# Patient Record
Sex: Male | Born: 1965
Health system: Southern US, Community
[De-identification: ages and names within clinical notes are randomized; demographics above are authoritative.]

## PROBLEM LIST (undated history)

## (undated) DIAGNOSIS — B20 Human immunodeficiency virus [HIV] disease: Secondary | ICD-10-CM

## (undated) DIAGNOSIS — R1031 Right lower quadrant pain: Secondary | ICD-10-CM

## (undated) HISTORY — PX: TONSILLECTOMY: SUR1361

## (undated) HISTORY — DX: Human immunodeficiency virus (HIV) disease: B20

## (undated) HISTORY — DX: Right lower quadrant pain: R10.31

---

## 1996-03-28 DIAGNOSIS — B20 Human immunodeficiency virus [HIV] disease: Secondary | ICD-10-CM

## 1996-03-28 DIAGNOSIS — Z21 Asymptomatic human immunodeficiency virus [HIV] infection status: Secondary | ICD-10-CM

## 1996-03-28 HISTORY — DX: Asymptomatic human immunodeficiency virus (hiv) infection status: Z21

## 1996-03-28 HISTORY — DX: Human immunodeficiency virus (HIV) disease: B20

## 2002-05-08 ENCOUNTER — Encounter: Payer: Self-pay | Admitting: Emergency Medicine

## 2002-05-08 ENCOUNTER — Emergency Department (HOSPITAL_COMMUNITY): Admission: EM | Admit: 2002-05-08 | Discharge: 2002-05-08 | Payer: Self-pay | Admitting: Emergency Medicine

## 2005-12-27 ENCOUNTER — Encounter (INDEPENDENT_AMBULATORY_CARE_PROVIDER_SITE_OTHER): Payer: Self-pay | Admitting: *Deleted

## 2005-12-27 ENCOUNTER — Inpatient Hospital Stay (HOSPITAL_COMMUNITY): Admission: EM | Admit: 2005-12-27 | Discharge: 2006-01-03 | Payer: Self-pay | Admitting: Emergency Medicine

## 2005-12-27 ENCOUNTER — Ambulatory Visit: Payer: Self-pay | Admitting: Internal Medicine

## 2005-12-27 ENCOUNTER — Ambulatory Visit: Payer: Self-pay | Admitting: Family Medicine

## 2005-12-27 LAB — CONVERTED CEMR LAB
CD4 Count: 110 microliters
CD4 T Cell Abs: 110

## 2006-02-01 ENCOUNTER — Ambulatory Visit: Payer: Self-pay | Admitting: Internal Medicine

## 2006-02-10 DIAGNOSIS — B20 Human immunodeficiency virus [HIV] disease: Secondary | ICD-10-CM | POA: Insufficient documentation

## 2006-02-10 DIAGNOSIS — K5000 Crohn's disease of small intestine without complications: Secondary | ICD-10-CM | POA: Insufficient documentation

## 2006-02-10 DIAGNOSIS — K560 Paralytic ileus: Secondary | ICD-10-CM | POA: Insufficient documentation

## 2006-05-12 ENCOUNTER — Encounter: Payer: Self-pay | Admitting: Internal Medicine

## 2006-05-22 ENCOUNTER — Encounter (INDEPENDENT_AMBULATORY_CARE_PROVIDER_SITE_OTHER): Payer: Self-pay | Admitting: *Deleted

## 2006-05-22 LAB — CONVERTED CEMR LAB
CD4 Count: 0 microliters
HIV 1 RNA Quant: 0 copies/mL

## 2006-06-04 ENCOUNTER — Encounter (INDEPENDENT_AMBULATORY_CARE_PROVIDER_SITE_OTHER): Payer: Self-pay | Admitting: *Deleted

## 2007-03-29 HISTORY — PX: HERNIA REPAIR: SHX51

## 2008-03-11 ENCOUNTER — Ambulatory Visit (HOSPITAL_COMMUNITY): Admission: RE | Admit: 2008-03-11 | Discharge: 2008-03-11 | Payer: Self-pay | Admitting: General Surgery

## 2008-12-27 IMAGING — CR DG CHEST 2V
2 series · 2 of 2 positions shown · non-contrast
Comparison: None

CLINICAL DATA: A supraumbilical hernia

CHEST - 2 VIEW

[view not recorded (1 of 2)]
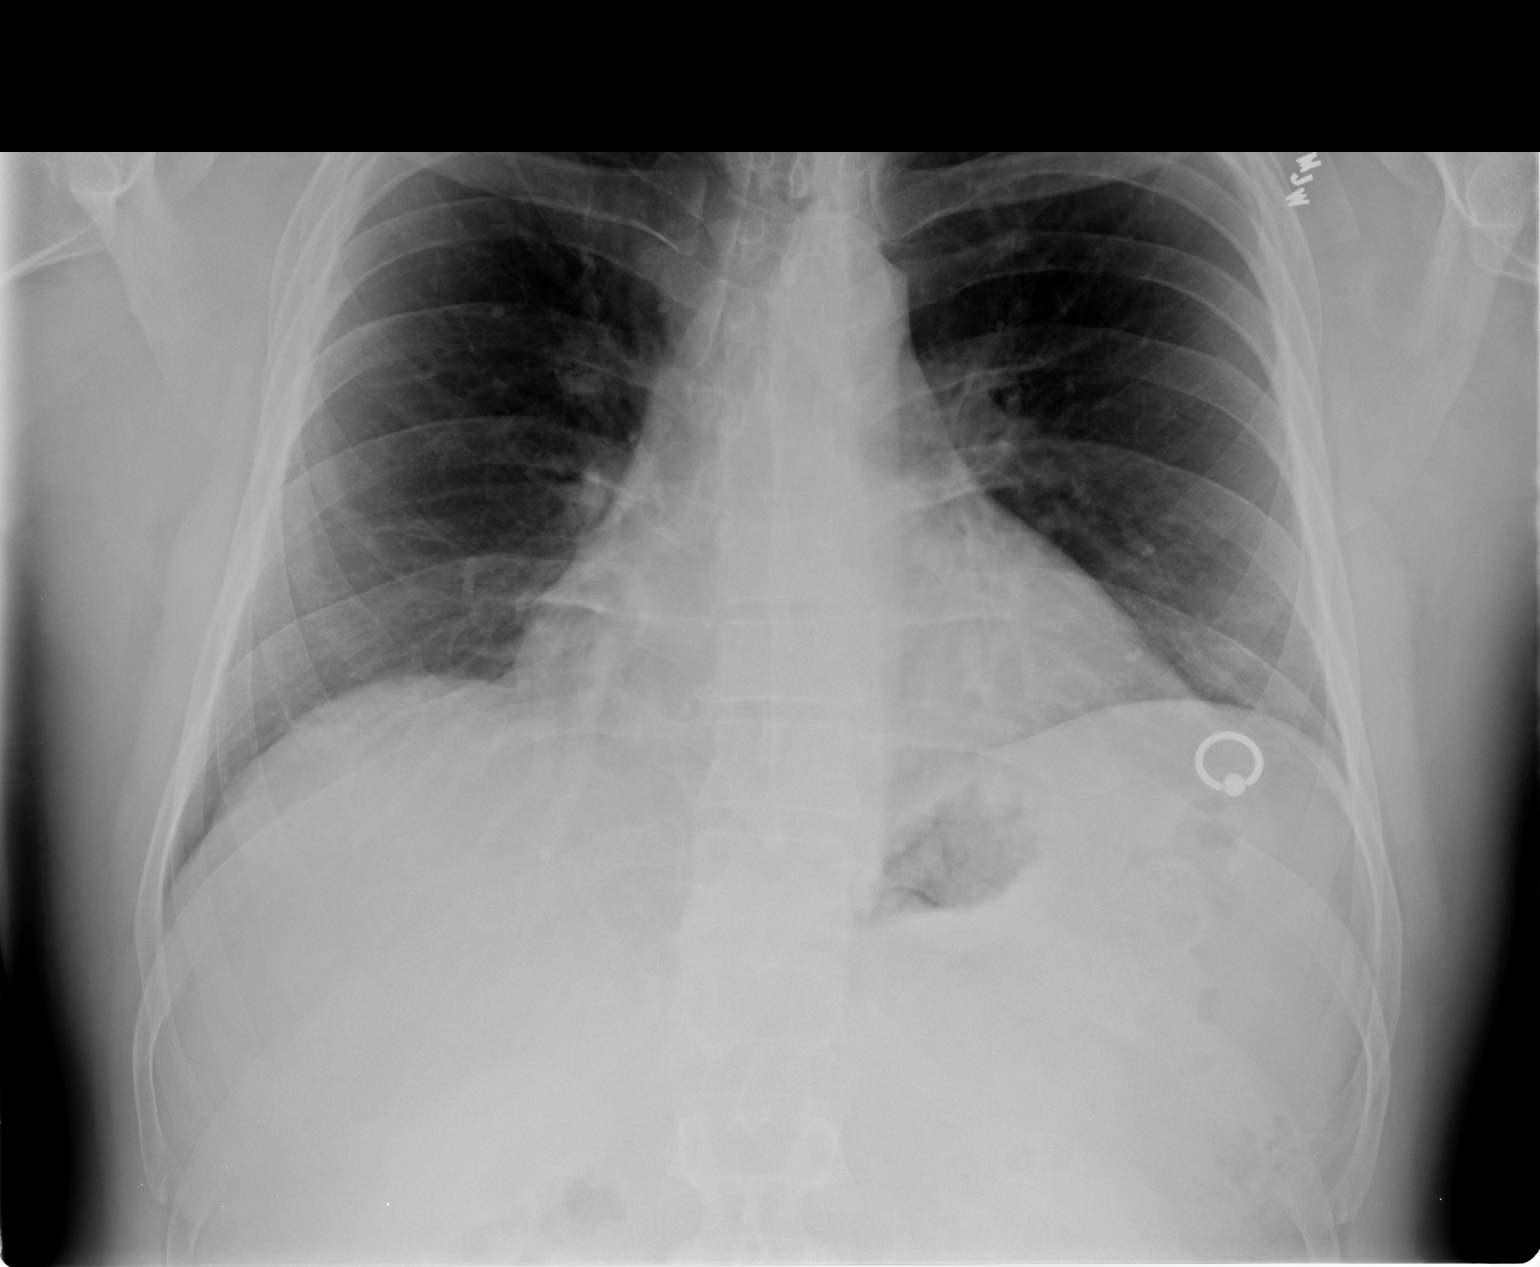

[view not recorded (2 of 2)]
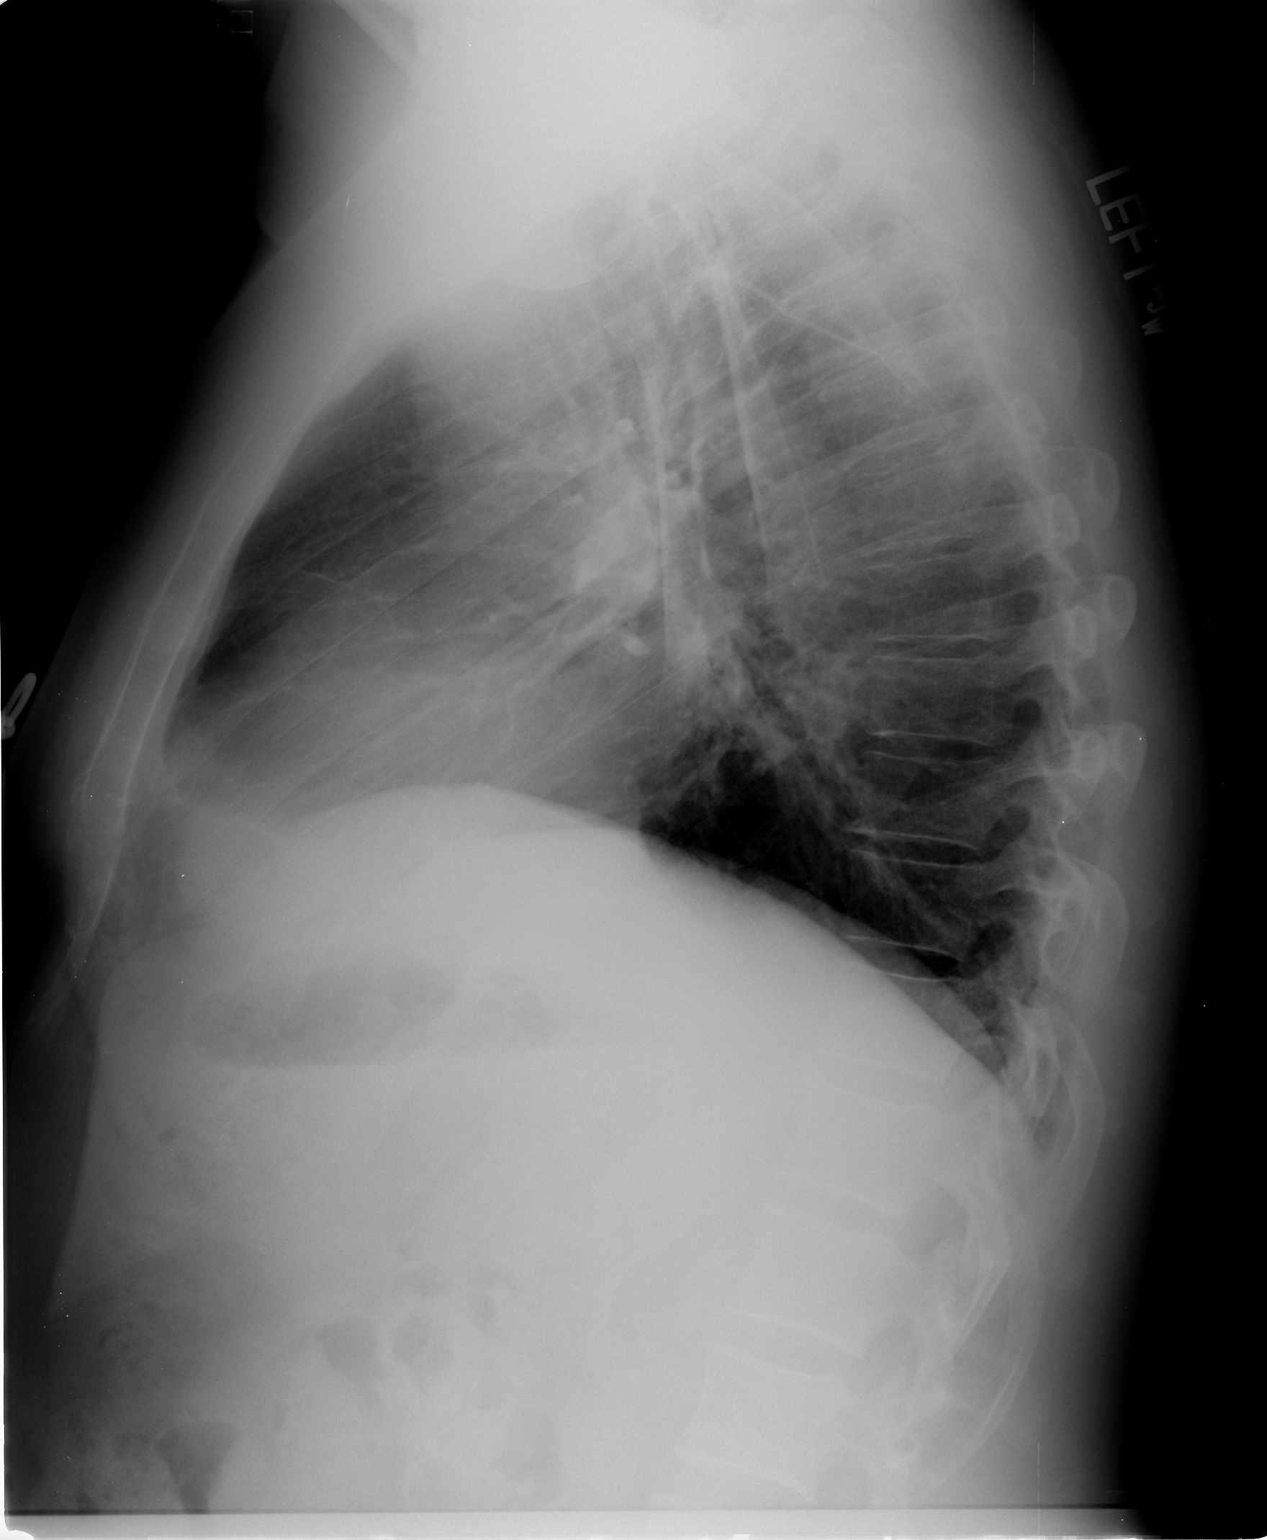

[2 of 2 positions shown; findings below may reference images not displayed]

FINDINGS: There is mild cardiac enlargement.

No pleural effusion or pulmonary interstitial edema noted.

The lung volumes are low.

No airspace densities are identified.
IMPRESSION: 1.  No active disease.
2.  Mild cardiac enlargement.

## 2010-02-25 HISTORY — PX: HERNIA REPAIR: SHX51

## 2010-08-10 NOTE — Op Note (Signed)
NAME:  Eric Chapman, Eric Chapman NO.:  [id number]   MEDICAL RECORD NO.:  [id number]          PATIENT TYPE:  AMB   LOCATION:                               FACILITY:  [facility]   PHYSICIAN:  Cherylynn Ridges, M.D.    DATE OF BIRTH:  05-10-1965   DATE OF PROCEDURE:  DATE OF DISCHARGE:                               OPERATIVE REPORT   Patient of Dr. [name]. [name], MD and Dr. Worthy Flank.   PREOPERATIVE DIAGNOSIS:  Supraumbilical ventral hernia.   POSTOPERATIVE DIAGNOSIS:  A 3-cm supraumbilical ventral hernia.   PROCEDURE:  Primary repair of ventral hernia without mesh.   SURGEON:  Cherylynn Ridges, MD   ANESTHESIA:  General with laryngeal airway.   ESTIMATED BLOOD LOSS:  Less than 20 mL.   COMPLICATIONS:  No complication.   CONDITION:  Stable.   SPECIMENS:  No specimens were sent.   FINDINGS:  A 3-cm supraumbilical defect, about 2 cm above the umbilicus.   INDICATIONS FOR OPERATION:  The patient is a 45 year old with  symptomatic supraumbilical ventral hernia who comes in for repair.   OPERATION:  The patient was taken to the operating room and placed on  table in the supine position.  After an adequate general laryngeal  airway anesthetic was administered, he was prepped and draped in the  usual sterile manner exposing the supraumbilical area.   A 5-cm midline supraumbilical incision was made using a #10 blade and  taken down to just above the umbilicus.  It was taken down to  subcutaneous tissue where the preperitoneal fat extruding through the  hernia defect was noted.   We dissected the flaps only about a centimeter from the fascial edge  around the hernia defect.  I could see that it was clearly separated  from the umbilicus by about 2 cm.  We twisted the preperitoneal extruded  contents back into the peritoneal space or preperitoneal space and then  we closed the defect using interrupted simple stitches of #1 Novofil.  We reinforced this using running  back-and-forth stitch of 0 Novofil.  No  mesh was used.  We irrigated with antibiotic solution and closed in 3  layers deep subcu layer of 3-0 Vicryl, no more superficial subcu layer  of 3-0  Vicryl, then the skin was closed using running subcuticular stitch of 4-  0 Monocryl.  Marcaine 0.5% without epi was injected into the wound prior  to closure.  Dermabond, Steri-Strips, and Tegaderm were used to complete  the dressing.  All counts were correct.      Cherylynn Ridges, M.D.  Electronically Signed     JOW/MEDQ  D:  03/11/2008  T:  03/11/2008  Job:  [id number]   cc:   Deniece Portela A. Sheffield Slider, M.D.
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
## 2010-08-13 NOTE — Consult Note (Signed)
NAME:  Eric Chapman, Eric Chapman NO.:  [id number]   MEDICAL RECORD NO.:  [id number]          PATIENT TYPE:  INP   LOCATION:  5019                         FACILITY:  [facility]   PHYSICIAN:  [name]. [name], MD    DATE OF BIRTH:  [birth year]   DATE OF CONSULTATION:  DATE OF DISCHARGE:                                   CONSULTATION   REASON FOR CONSULTATION:  Ileus versus small-bowel obstruction.   HISTORY OF PRESENT ILLNESS:  This is a 45 year old gentleman with a past  medical history of HIV who was admitted to the hospital approximately 1 week  ago with complaints of severe diarrhea.  The patient states that his  diarrhea started rather acutely on Saturday night and then progressed to the  point that he felt that he needed further treatment in a hospital setting  and subsequently admitted and underwent a CT scan of his abdomen as his KUB  revealed dilated loops of bowel. The CT scan was consistent with a dilated  loops of jejunum, however, there is no obvious evidence of a transition  point. Over the course of this hospitalization he was diagnosed with a  gastroenteritis and started on Flagyl and Cipro which he responded. His  stool studies were negative for any evidence of C diff and he was  subsequently discontinued on Flagyl. Over the course of his hospitalization,  he has continued to improve from the diarrheal standpoint however, his  abdominal distension did worsen. Initially did notice having abdominal  distension prior to his admission however, and it appeared to be stable, but  then acutely worsened several days into his admission and subsequently  required the insertion of NG tube. The patient denies any problems with  abdominal distension in the past and he also denies having any prior  surgical procedures. He did have one episode of intense abdominal pain bit  it appeared to have resolved spontaneously and currently he is having  frequent soft bowel movements at this  time which is a marked improvement  from his admission status.   PAST MEDICAL HISTORY/PAST SURGICAL HISTORY:  As stated above.   MEDICATIONS PRIOR TO ADMISSION:  Atripla 1 p.o. daily, which he has a  history of noncompliance with his medication.   ALLERGIES:  No known drug allergies.   FAMILY HISTORY:  Noncontributory for his current admission.   SOCIAL HISTORY:  The patient is self-employed. No tobacco, alcohol or  illicit drug use.   REVIEW OF SYSTEMS:  Positive for diarrhea, some nausea and vomiting during  the hospitalization no headache, no blurry vision, dysuria, dysarthria,  chest pain, shortness of breath, fevers, chills, constipation, skin rashes  or joint aches.   PHYSICAL EXAMINATION:  VITAL SIGNS:  Blood pressure is 131/90, heart rate is  80, temperature is 98.4.  GENERAL:  The patient is in no acute distress, alert and oriented.  HEENT:  Normocephalic, atraumatic.  Extraocular muscles intact.  Pupils  equal, round, reactive to light.  NECK: Supple.  No lymphadenopathy.  LUNGS: Clear to auscultation bilaterally.  CARDIOVASCULAR:  Regular rate and rhythm without murmurs,  gallops or rubs.  ABDOMEN: Soft, it is distended. There is a marked amount of tympany,  also a  marked decrease of bowel sounds.  EXTREMITIES:  No clubbing, cyanosis or edema.   LABORATORY VALUES:  White blood cell count 10.3, hemoglobin 12.7, platelets  at 366.  Sodium -136, potassium 3.9, chloride 107, CO2 25, glucose is 110,  BUN 3, creatinine is 0.7. His CD-4 count on admission was 110.   IMPRESSION:  1. Abdominal distension.  2. Is HIV.  3. Gastroenteritis.   After evaluation of the patient it is unclear to me why the patient  continues to have a an ileus versus a small bowel obstruction and within the  past few days it has markedly worsened and he is now currently on  intermittent suction with an NG tube.  The patient does not demonstrate any  fevers and there is no history of any surgical  intervention in his abdomen  to suggest any adhesions. Initial CT on admission was also negative for any  evidence of adenopathy or suggestions of any complications of HIV. At this  time conservative management will be approached, however, if he does not  improve, repeat imaging of his  abdomen may be required with a CT scan.   PLAN:  1. Continue with NG tube on intermittent suction.  2. If no improvement a CT scan of the abdomen and pelvis.  3. Pending the results of the CT scan, surgical consultation may be      warranted.      [name] [name], MD  Electronically Signed     PDH/MEDQ  D:  12/30/2005  T:  01/01/2006  Job:  [id number]   cc:   Deniece Portela A. Sheffield Slider, M.D.

## 2010-12-30 LAB — COMPREHENSIVE METABOLIC PANEL
ALT: 31 U/L (ref 0–53)
AST: 22 U/L (ref 0–37)
Albumin: 4.1 g/dL (ref 3.5–5.2)
Alkaline Phosphatase: 50 U/L (ref 39–117)
Chloride: 107 mEq/L (ref 96–112)
Creatinine, Ser: 0.83 mg/dL (ref 0.4–1.5)
GFR calc Af Amer: >60 mL/min (ref >60)
Potassium: 4.5 mEq/L (ref 3.5–5.1)
Sodium: 141 mEq/L (ref 135–145)
Total Bilirubin: 0.7 mg/dL (ref 0.3–1.2)

## 2010-12-30 LAB — DIFFERENTIAL
Basophils Absolute: 0 10*3/uL (ref 0.0–0.1)
Eosinophils Relative: 4 % (ref 0–5)
Lymphocytes Relative: 27 % (ref 12–46)
Monocytes Absolute: 0.7 10*3/uL (ref 0.1–1.0)

## 2010-12-30 LAB — CBC
Platelets: 315 10*3/uL (ref 150–400)
WBC: 5.9 10*3/uL (ref 4.0–10.5)

## 2011-04-05 ENCOUNTER — Ambulatory Visit: Payer: BC Managed Care – HMO

## 2011-04-05 DIAGNOSIS — J019 Acute sinusitis, unspecified: Secondary | ICD-10-CM

## 2011-04-05 DIAGNOSIS — R05 Cough: Secondary | ICD-10-CM

## 2011-04-05 DIAGNOSIS — R059 Cough, unspecified: Secondary | ICD-10-CM

## 2011-04-25 ENCOUNTER — Encounter (INDEPENDENT_AMBULATORY_CARE_PROVIDER_SITE_OTHER): Payer: Self-pay

## 2011-04-25 ENCOUNTER — Ambulatory Visit (INDEPENDENT_AMBULATORY_CARE_PROVIDER_SITE_OTHER): Payer: BC Managed Care – HMO | Admitting: General Surgery

## 2011-04-25 ENCOUNTER — Encounter (INDEPENDENT_AMBULATORY_CARE_PROVIDER_SITE_OTHER): Payer: Self-pay | Admitting: General Surgery

## 2011-04-25 VITALS — BP 118/72 | HR 92 | Temp 99.4°F | Ht 70.5 in | Wt 236.0 lb

## 2011-04-25 DIAGNOSIS — R109 Unspecified abdominal pain: Secondary | ICD-10-CM

## 2011-04-25 NOTE — Progress Notes (Signed)
HPI The patient comes in complaining of right lower quadrant abdominal wall pain.  PE On examination there is no evidence of recurrent hernia. There is no infection.  Studiy review None  Assessment Right lower abdominal wall pain possibly secondary to irritation of an abdominal wall nerve secondary to his hernia repair. The hernia repair was done about a year ago.  Plan The patient with Dr. Michaell Cowing. At this time the only treatment would be for the pain specifically. Did not recommend any studies.

## 2011-05-09 ENCOUNTER — Telehealth (INDEPENDENT_AMBULATORY_CARE_PROVIDER_SITE_OTHER): Payer: Self-pay

## 2011-05-09 NOTE — Telephone Encounter (Signed)
LMOM stating that we had to cancel the office for 2-12 with Dr Michaell Cowing and that I r/s the appt for pt on 05-25-11 arrive at 11:30.

## 2011-05-10 ENCOUNTER — Ambulatory Visit (INDEPENDENT_AMBULATORY_CARE_PROVIDER_SITE_OTHER): Payer: BC Managed Care – HMO | Admitting: Surgery

## 2011-05-17 ENCOUNTER — Ambulatory Visit (INDEPENDENT_AMBULATORY_CARE_PROVIDER_SITE_OTHER): Payer: BC Managed Care – HMO | Admitting: Surgery

## 2011-05-25 ENCOUNTER — Ambulatory Visit (INDEPENDENT_AMBULATORY_CARE_PROVIDER_SITE_OTHER): Payer: BC Managed Care – HMO | Admitting: Surgery

## 2011-05-25 ENCOUNTER — Encounter (INDEPENDENT_AMBULATORY_CARE_PROVIDER_SITE_OTHER): Payer: Self-pay | Admitting: Surgery

## 2011-05-25 VITALS — BP 132/80 | HR 78 | Temp 97.8°F | Resp 18 | Ht 71.0 in | Wt 235.6 lb

## 2011-05-25 DIAGNOSIS — R1033 Periumbilical pain: Secondary | ICD-10-CM

## 2011-05-25 NOTE — Patient Instructions (Signed)

## 2011-05-25 NOTE — Progress Notes (Signed)
Subjective:     Patient ID: Eric Serene., male   DOB: 1965-11-17, 46 y.o.   MRN: [id number]  HPI  Eric Chapman  1966/02/17 [id number]  Patient Care Team: Clydie Braun L. Hal Hope, MD as PCP - General (Family Medicine)  This patient is a 46 y.o.male who presents today for surgical evaluation.   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  He had an episode of pain right periumbilical a couple of months ago.  It did not seem to improve.  He came to [facility] late January.  No n/v.  BMs OK. No new lump.  He has gained weight since surgery.  No fevers/chills/sweats.  More with activity, especially raising right arm.  Not worse with eating.  Appetite good.  Having difficulty affording meds for HIV.  Due for follow-up with [facility] for HIV care.  It has decreased in the past few weeks to just occasional pain with cough/sneeze.  Patient Active Problem List  Diagnoses  . HIV DISEASE  . GASTROENTERITIS  . ILEUS  . Abdominal pain, R sided periumbilical    Past Medical History  Diagnosis Date  . HIV infection   . RLQ abdominal pain     Past Surgical History  Procedure Date  . Hernia repair     History   Social History  . Marital Status: Single    Spouse Name: N/A    Number of Children: N/A  . Years of Education: N/A   Occupational History  . Not on file.   Social History Main Topics  . Smoking status: Never Smoker   . Smokeless tobacco: Never Used  . Alcohol Use: No  . Drug Use: No  . Sexually Active: Not on file   Other Topics Concern  . Not on file   Social History Narrative  . No narrative on file    No family history on file.  No current outpatient prescriptions on file.  No Known Allergies  BP 132/80  Pulse 78  Temp(Src) 97.8 F (36.6 C) (Temporal)  Resp 18  Ht 5\' 11"  (1.803 m)  Wt 235 lb 9.6 oz (106.867 kg)  BMI 32.86 kg/m2     Review of Systems  Constitutional: Negative for fever, chills and diaphoresis.    HENT: Negative for sore throat, trouble swallowing and neck pain.   Eyes: Negative for photophobia and visual disturbance.  Respiratory: Negative for choking and shortness of breath.   Cardiovascular: Negative for chest pain and palpitations.  Gastrointestinal: Positive for abdominal pain. Negative for nausea, vomiting, diarrhea, constipation, blood in stool, abdominal distention, anal bleeding and rectal pain.  Genitourinary: Negative for dysuria, urgency, difficulty urinating and testicular pain.  Musculoskeletal: Negative for myalgias, arthralgias and gait problem.  Skin: Negative for color change and rash.  Neurological: Negative for dizziness, speech difficulty, weakness and numbness.  Hematological: Negative for adenopathy.  Psychiatric/Behavioral: Negative for hallucinations, confusion and agitation.       Objective:   Physical Exam  Constitutional: He is oriented to person, place, and time. He appears well-developed and well-nourished. No distress.  HENT:  Head: Normocephalic.  Mouth/Throat: Oropharynx is clear and moist. No oropharyngeal exudate.  Eyes: Conjunctivae and EOM are normal. Pupils are equal, round, and reactive to light. No scleral icterus.  Neck: Normal range of motion. No tracheal deviation present.  Cardiovascular: Normal rate, normal heart sounds and intact distal pulses.   Pulmonary/Chest: Effort normal. No respiratory distress.  Abdominal: Soft.  He exhibits no distension and no mass. There is no rebound and no guarding. Hernia confirmed negative in the right inguinal area and confirmed negative in the left inguinal area.       Incisions clean with normal healing ridges.  No hernias.  Mild soreness at R paramedian region/flank.  Musculoskeletal: Normal range of motion. He exhibits no tenderness.  Neurological: He is alert and oriented to person, place, and time. No cranial nerve deficit. He exhibits normal muscle tone. Coordination normal.  Skin: Skin is warm  and dry. No rash noted. He is not diaphoretic.  Psychiatric: He has a normal mood and affect. His behavior is normal.       Assessment:     R paramedian pain, musculoskeletal in nature, nearly resolved.  No evidence of new or recurrent hernia    Plan:     Increase activity as tolerated.  Do not push through pain.  Heat/Aleve round the clock for 1-2 weeks.   Work to lose weight to avoid hernia recurrence & increasing strain to abdominal wall .  Return to clinic p.r.n. The patient expressed understanding and appreciation

## 2011-08-12 ENCOUNTER — Ambulatory Visit (INDEPENDENT_AMBULATORY_CARE_PROVIDER_SITE_OTHER): Payer: BC Managed Care – HMO | Admitting: Internal Medicine

## 2011-08-12 VITALS — BP 113/73 | HR 80 | Temp 98.2°F | Resp 20 | Ht 70.0 in | Wt 230.2 lb

## 2011-08-12 DIAGNOSIS — N50819 Testicular pain, unspecified: Secondary | ICD-10-CM

## 2011-08-12 DIAGNOSIS — N509 Disorder of male genital organs, unspecified: Secondary | ICD-10-CM

## 2011-08-12 DIAGNOSIS — Z87442 Personal history of urinary calculi: Secondary | ICD-10-CM

## 2011-08-12 DIAGNOSIS — R300 Dysuria: Secondary | ICD-10-CM

## 2011-08-12 LAB — POCT URINALYSIS DIPSTICK
Bilirubin, UA: NEGATIVE
Blood, UA: NEGATIVE
Nitrite, UA: NEGATIVE
pH, UA: 5.5

## 2011-08-12 LAB — POCT UA - MICROSCOPIC ONLY
Casts, Ur, LPF, POC: NEGATIVE
Crystals, Ur, HPF, POC: NEGATIVE
Mucus, UA: NEGATIVE

## 2011-08-12 MED ORDER — DOXYCYCLINE HYCLATE 100 MG PO TABS: 100.0000 mg | ORAL_TABLET | Freq: Two times a day (BID) | ORAL | Status: AC

## 2011-08-12 NOTE — Progress Notes (Signed)
  Subjective:    Patient ID: Vicente Serene., male    DOB: 1965-10-29, 46 y.o.   MRN: [id number]  HPI Has 2d of dysuria, testicle ache, with no abd pain. Feels no sxs now. No fever ,nausea, disch, or rashes. Has hx of passing many kidney stones and thinks this could be one.  Also phx of HIV disease on no rx now due to cost, ID docs are at [facility].    Review of Systems     Objective:   Physical Exam Appears well Abd not tender, testis nl exam and no hernia, prostate not tender or enlged No rashes or penile disch.  ccua and uriprobe Results for orders placed in visit on 08/12/11  POCT URINALYSIS DIPSTICK      Component Value Range   Color, UA yellow     Clarity, UA clear     Glucose, UA neg     Bilirubin, UA neg     Ketones, UA neg     Spec Grav, UA >=1.030     Blood, UA neg     pH, UA 5.5     Protein, UA neg     Urobilinogen, UA 0.2     Nitrite, UA neg     Leukocytes, UA Negative    POCT UA - MICROSCOPIC ONLY      Component Value Range   WBC, Ur, HPF, POC 0-1     RBC, urine, microscopic 0-1     Bacteria, U Microscopic trace     Mucus, UA neg     Epithelial cells, urine per micros neg     Crystals, Ur, HPF, POC neg     Casts, Ur, LPF, POC neg     Yeast, UA neg           Assessment & Plan:  Dysuria Doxycycline

## 2011-08-12 NOTE — Patient Instructions (Signed)
Urethritis, Adult  Urethritis is an inflammation (soreness) of the urethra (the tube exiting from the bladder). It is often caused by germs that may be spread through sexual contact.  TREATMENT   Urethritis will usually respond to antibiotics. These are medications that kill germs. Take all the medicine given to you. You may feel better in a couple days, but TAKE ALL MEDICINE or the infection may not be completely cured and may become more difficult to treat. Response can generally be expected in 7 to 10 days. You may require additional treatment after more testing.  HOME CARE INSTRUCTIONS   Not have sex until the test results are known and treatment is completed.   Know that you may be asked to notify your sex partner when your final test results are back.   Finish all medications as prescribed.   Prevent sexually transmitted infections including AIDS. Practice safe sex. Use condoms.  SEEK MEDICAL CARE IF:    Your symptoms are not improved in 2 to 3 days.   Your symptoms are getting worse.   Your develop abdominal pain.   You develop joint pain.  SEEK IMMEDIATE MEDICAL CARE IF:    You have a fever.   You develop severe pain in the belly, back or side.   You develop repeated vomiting.  TEST RESULTS  Not all test results are available during your visit. If your test results are not back during the visit, make an appointment with your caregiver to find out the results. Do not assume everything is normal if you have not heard from your caregiver or the medical facility. It is important for you to follow-up on all of your test results.  Document Released: 09/07/2000 Document Revised: 03/03/2011 Document Reviewed: 03/30/2009  [employer] Patient Information 2012 [employer].

## 2011-08-13 ENCOUNTER — Encounter: Payer: Self-pay | Admitting: Radiology

## 2012-04-05 ENCOUNTER — Ambulatory Visit: Payer: BC Managed Care – HMO

## 2012-04-05 ENCOUNTER — Ambulatory Visit (INDEPENDENT_AMBULATORY_CARE_PROVIDER_SITE_OTHER): Payer: BC Managed Care – HMO | Admitting: Emergency Medicine

## 2012-04-05 VITALS — BP 116/81 | HR 96 | Temp 99.4°F | Resp 18 | Ht 71.0 in | Wt 220.0 lb

## 2012-04-05 DIAGNOSIS — R05 Cough: Secondary | ICD-10-CM

## 2012-04-05 DIAGNOSIS — B20 Human immunodeficiency virus [HIV] disease: Secondary | ICD-10-CM

## 2012-04-05 DIAGNOSIS — J189 Pneumonia, unspecified organism: Secondary | ICD-10-CM

## 2012-04-05 DIAGNOSIS — R059 Cough, unspecified: Secondary | ICD-10-CM

## 2012-04-05 DIAGNOSIS — J111 Influenza due to unidentified influenza virus with other respiratory manifestations: Secondary | ICD-10-CM

## 2012-04-05 LAB — POCT CBC
Granulocyte percent: 69.5 %G (ref 37–80)
MID (cbc): 0.6 (ref 0–0.9)
MPV: 8.6 fL (ref 0–99.8)
POC Granulocyte: 6.3 (ref 2–6.9)
POC MID %: 6.6 %M (ref 0–12)
Platelet Count, POC: 328 10*3/uL (ref 142–424)
RBC: 6.03 M/uL (ref 4.69–6.13)

## 2012-04-05 LAB — POCT INFLUENZA A/B: Influenza B, POC: NEGATIVE

## 2012-04-05 MED ORDER — OSELTAMIVIR PHOSPHATE 75 MG PO CAPS: 75.0000 mg | ORAL_CAPSULE | Freq: Two times a day (BID) | ORAL | Status: DC

## 2012-04-05 MED ORDER — SULFAMETHOXAZOLE-TRIMETHOPRIM 800-160 MG PO TABS: ORAL_TABLET | ORAL | Status: DC

## 2012-04-05 NOTE — Patient Instructions (Addendum)
Take Tamiflu as prescribed. You will hear from the ID department regarding your appointment. If they have trouble with becoming more short of breath difficulty breathing or worsening symptoms please present directly to the emergency room.

## 2012-04-05 NOTE — Progress Notes (Signed)
  Subjective:    Patient ID: Vicente Serene., male    DOB: Oct 07, 1965, 47 y.o.   MRN: [id number]  HPI patient with known HIV disease not currently on treatment due to cost. He enters with onset insomnia of chest tightness nasal congestion not associated with fever. He has not had a flu shot this year. As stated above he is not on treatment for his underlying condition. He has not coughed up much phlegm. He denies being short of breath. He denies a productive cough. He was initially treated for HIV disease from 1999 to approximately 3-1/2 years ago and has not been on medication since that time.    Review of Systems     Objective:   Physical Exam HEENT exam reveals an alert male who is not tachypneic his TMs are clear his nose is normal throat is clear chest exam reveals rales from midlung to the bases in both lungs. His cardiac exam is unremarkable.  [facility] reading (PRIMARY) by  Dr. Cleta Alberts there are bibasilar patchy infiltrates present on chest x-ray.  Results for orders placed in visit on 04/05/12  POCT CBC      Component Value Range   WBC 9.0  4.6 - 10.2 K/uL   Lymph, poc 2.2  0.6 - 3.4   POC LYMPH PERCENT 23.9  10 - 50 %L   MID (cbc) 0.6  0 - 0.9   POC MID % 6.6  0 - 12 %M   POC Granulocyte 6.3  2 - 6.9   Granulocyte percent 69.5  37 - 80 %G   RBC 6.03  4.69 - 6.13 M/uL   Hemoglobin 17.2  14.1 - 18.1 g/dL   HCT, POC 40.9 (*) 81.1 - 53.7 %   MCV 90.0  80 - 97 fL   MCH, POC 28.5  27 - 31.2 pg   MCHC 31.7 (*) 31.8 - 35.4 g/dL   RDW, POC 91.4     Platelet Count, POC 328  142 - 424 K/uL   MPV 8.6  0 - 99.8 fL       Assessment & Plan:  We'll start with CBC chest x-ray and flu swab. Chest x-ray shows bibasilar infiltrates. Flu swab was negative. We'll treat with Tamiflu twice a day. Have ordered a stat CD4 count. We'll check the patient tomorrow. Peak flow Was checked and was 518. Patient did 2 laps around the office and pulse ox dropped to 93. We'll go ahead and add Septra DS to take 2  tablets 3 times a day with option to stop the medication if his CD4 count is normal. Case discussed with Dr. Ninetta Lights ID at [facility].

## 2012-04-06 ENCOUNTER — Ambulatory Visit (INDEPENDENT_AMBULATORY_CARE_PROVIDER_SITE_OTHER): Payer: BC Managed Care – PPO | Admitting: Internal Medicine

## 2012-04-06 ENCOUNTER — Ambulatory Visit: Payer: BC Managed Care – HMO | Admitting: Emergency Medicine

## 2012-04-06 ENCOUNTER — Other Ambulatory Visit: Payer: Self-pay | Admitting: Internal Medicine

## 2012-04-06 ENCOUNTER — Telehealth: Payer: Self-pay | Admitting: Emergency Medicine

## 2012-04-06 ENCOUNTER — Encounter: Payer: Self-pay | Admitting: Internal Medicine

## 2012-04-06 VITALS — BP 114/77 | HR 85 | Temp 98.1°F | Resp 16 | Ht 71.0 in | Wt 227.0 lb

## 2012-04-06 VITALS — BP 135/85 | HR 77 | Temp 98.0°F | Ht 71.0 in | Wt 228.8 lb

## 2012-04-06 DIAGNOSIS — R509 Fever, unspecified: Secondary | ICD-10-CM

## 2012-04-06 DIAGNOSIS — Z79899 Other long term (current) drug therapy: Secondary | ICD-10-CM

## 2012-04-06 DIAGNOSIS — B20 Human immunodeficiency virus [HIV] disease: Secondary | ICD-10-CM

## 2012-04-06 DIAGNOSIS — R05 Cough: Secondary | ICD-10-CM

## 2012-04-06 DIAGNOSIS — R059 Cough, unspecified: Secondary | ICD-10-CM

## 2012-04-06 DIAGNOSIS — Z21 Asymptomatic human immunodeficiency virus [HIV] infection status: Secondary | ICD-10-CM

## 2012-04-06 DIAGNOSIS — Z113 Encounter for screening for infections with a predominantly sexual mode of transmission: Secondary | ICD-10-CM

## 2012-04-06 LAB — LIPID PANEL
Cholesterol: 131 mg/dL (ref 0–200)
HDL: 23 mg/dL — ABNORMAL LOW
LDL Cholesterol: 76 mg/dL (ref 0–99)
Total CHOL/HDL Ratio: 5.7 ratio
Triglycerides: 159 mg/dL — ABNORMAL HIGH
VLDL: 32 mg/dL (ref 0–40)

## 2012-04-06 LAB — T-HELPER CELLS (CD4) COUNT (NOT AT ARMC): WBC, lymph enumeration: 7.8 10*3/uL (ref 4.0–10.5)

## 2012-04-06 LAB — COMPREHENSIVE METABOLIC PANEL WITH GFR
ALT: 67 U/L — ABNORMAL HIGH (ref 0–53)
AST: 35 U/L (ref 0–37)
Albumin: 4.4 g/dL (ref 3.5–5.2)
Alkaline Phosphatase: 36 U/L — ABNORMAL LOW (ref 39–117)
BUN: 11 mg/dL (ref 6–23)
CO2: 27 meq/L (ref 19–32)
Calcium: 9.5 mg/dL (ref 8.4–10.5)
Chloride: 101 meq/L (ref 96–112)
Creat: 1.05 mg/dL (ref 0.50–1.35)
Glucose, Bld: 92 mg/dL (ref 70–99)
Potassium: 4.4 meq/L (ref 3.5–5.3)
Sodium: 137 meq/L (ref 135–145)
Total Bilirubin: 0.5 mg/dL (ref 0.3–1.2)
Total Protein: 7.4 g/dL (ref 6.0–8.3)

## 2012-04-06 LAB — RPR

## 2012-04-06 NOTE — Progress Notes (Signed)
  Subjective:    Patient ID: Eric Chapman., male    DOB: 05-Jun-1965, 47 y.o.   MRN: [id number]  HPI patient enters for recheck of his visit yesterday. He was seen in the chest congestion and cough. Radiologist read his x-ray is normal have her on exam he had basilar rales bilaterally. His CBC is normal. His pulse ox was running 94/95 yesterday. Patient states he feels pretty good. He has a cough but overall is not feeling bad. He went to the HIV clinic today and they did his initial testing there. He is scheduled to be seen later in the month.    Review of Systems     Objective:   Physical Exam Patient is alert and cooperative does not appear in any distress. His neck was supple. His chest is both clear anteriorly but posteriorly he has rales in the lower lung bilaterally.       Assessment & Plan:  Clinically the patient has a pneumonia. He does have a history HIV disease his CD4 status is unknown. We'll treat with Tamiflu and Septra as previously ordered recheck if any worsening to

## 2012-04-06 NOTE — Telephone Encounter (Signed)
Called and left message  results are back. We'll continue Tamiflu and Septra for now.

## 2012-04-06 NOTE — Progress Notes (Signed)
Obtained HIV lab work in preparation for return visit in 3 weeks. Release of Information signed by pt to receive records from [facility].  Medical records Ph # [phone], fax [phone].  ROI faxed.

## 2012-04-07 LAB — CBC WITH DIFFERENTIAL/PLATELET
Basophils Relative: 1 % (ref 0–1)
Hemoglobin: 15.6 g/dL (ref 13.0–17.0)
Lymphs Abs: 2.1 10*3/uL (ref 0.7–4.0)
MCHC: 34.1 g/dL (ref 30.0–36.0)
Monocytes Relative: 12 % (ref 3–12)
Neutro Abs: 1.4 10*3/uL — ABNORMAL LOW (ref 1.7–7.7)
Neutrophils Relative %: 34 % — ABNORMAL LOW (ref 43–77)
Platelets: 277 10*3/uL (ref 150–400)
RBC: 5.53 MIL/uL (ref 4.22–5.81)

## 2012-04-08 LAB — HIV-1 RNA ULTRAQUANT REFLEX TO GENTYP+: HIV-1 RNA Quant, Log: 4.32 {Log} — ABNORMAL HIGH (ref <1.30)

## 2012-04-09 LAB — HEPATITIS PANEL, ACUTE
HCV Ab: NEGATIVE
Hep A IgM: NEGATIVE

## 2012-04-09 NOTE — Progress Notes (Signed)
Patient ID: Eric Serene., male   DOB: Jun 11, 1965, 47 y.o.   MRN: [id number] Patient seen by Angelique Blonder, clinic RN, for initial labs and assessment. Patient denies any symptoms concerning for OI. Baseline labs drawn today.

## 2012-04-11 LAB — HIV-1 GENOTYPR PLUS

## 2012-04-19 ENCOUNTER — Ambulatory Visit: Payer: BC Managed Care – HMO | Admitting: Emergency Medicine

## 2012-04-19 ENCOUNTER — Ambulatory Visit: Payer: BC Managed Care – HMO

## 2012-04-19 VITALS — BP 116/76 | HR 79 | Temp 98.3°F | Resp 16 | Ht 71.0 in | Wt 231.0 lb

## 2012-04-19 DIAGNOSIS — R05 Cough: Secondary | ICD-10-CM

## 2012-04-19 DIAGNOSIS — A491 Streptococcal infection, unspecified site: Secondary | ICD-10-CM

## 2012-04-19 DIAGNOSIS — B953 Streptococcus pneumoniae as the cause of diseases classified elsewhere: Secondary | ICD-10-CM

## 2012-04-19 DIAGNOSIS — R059 Cough, unspecified: Secondary | ICD-10-CM

## 2012-04-19 MED ORDER — BENZONATATE 100 MG PO CAPS: 100.0000 mg | ORAL_CAPSULE | Freq: Three times a day (TID) | ORAL | Status: DC | PRN

## 2012-04-19 MED ORDER — HYDROCODONE-HOMATROPINE 5-1.5 MG/5ML PO SYRP: 5.0000 mL | ORAL_SOLUTION | Freq: Three times a day (TID) | ORAL | Status: DC | PRN

## 2012-04-19 NOTE — Progress Notes (Signed)
  Subjective:    Patient ID: Eric Serene., male    DOB: June 30, 1965, 47 y.o.   MRN: [id number]  HPI  Patient here to follow up in regards to Pneumonia. Patient states he is feeling well. Is requesting cough medication to take at night. Apparently he is allergic to Percocet. But he is taken cough syrup without difficulty. He is scheduled to see ID department in one week  Review of Systems     Objective:   Physical Exam patient looks well. He does not appear ill. He is in no distress .  His chest revealed rare rhonchi but no rales were heard. [facility] reading (PRIMARY) by  Dr.Daub chest x-ray looks improved to me the prominent markings present on previous film have greatly improved .      Assessment & Plan:  Patient is much improved. I given Tessalon Perles for cough

## 2012-04-25 ENCOUNTER — Encounter: Payer: Self-pay | Admitting: Internal Medicine

## 2012-04-25 ENCOUNTER — Ambulatory Visit (INDEPENDENT_AMBULATORY_CARE_PROVIDER_SITE_OTHER): Payer: BC Managed Care – PPO | Admitting: Internal Medicine

## 2012-04-25 VITALS — BP 124/87 | HR 69 | Temp 98.3°F | Wt 239.0 lb

## 2012-04-25 DIAGNOSIS — Z23 Encounter for immunization: Secondary | ICD-10-CM

## 2012-04-25 NOTE — Progress Notes (Signed)
[facility] CLIN[facility] NOTE  RFV: establishing care Subjective:    Patient ID: Eric Chapman., male    DOB: [birth year], 47 y.o.   MRN: [id number]  HPI [name] is a 47 yo Male, with HIV  Disease, first diagnosed in 1998, CD 4 count nadir of 180 at diagnosis. RF for HIV, MSM. Current CD 4 count 300/ VL 21,080, genotype reverted to wildtype. He has been off of HIV meds for the past 2.65yr due to poor insurance. 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  He was seen in urgent care for ILI, treated for flu as given bactrim for possible PCP pneumonia, since provider read CD4 count of 30. He took meds, did well.  He now lives in [place], but insurance from [place] address. Self-employed as a Museum/gallery exhibitions officer. He currently pays $350 per month for insurance.  Active Ambulatory Problems    Diagnosis Date Noted  . HIV DISEASE 02/10/2006  . GASTROENTERITIS 02/10/2006  . ILEUS 02/10/2006  . Abdominal pain, R sided periumbilical 05/25/2011  . History of kidney stones 08/12/2011   Resolved Ambulatory Problems    Diagnosis Date Noted  . No Resolved Ambulatory Problems   Past Medical History  Diagnosis Date  . HIV infection   . RLQ abdominal pain   . Chronic kidney disease   -  S/p abdominal hernia surgery without complications (2011, 2012)  Social hx: previously in a long term relationship of 28yr but now split up over the last 6 yr.legal split up. Currently dating someone, male partner. No smoking, no ETOH use  Family hx: lung ca/copd (father smoker); thyroid cancer (MGF);   Review of Systems Constituional: + weight gain  HENT: Negative for congestion, sore throat, rhinorrhea, sneezing, trouble swallowing and sinus pressure.  Eyes: Negative for photophobia and visual disturbance.  Respiratory: Negative for cough, chest tightness, shortness of breath, wheezing and  stridor.  Cardiovascular: Negative for chest pain, palpitations and leg swelling.  Gastrointestinal: Negative for nausea, vomiting, abdominal pain, diarrhea, constipation, blood in stool, abdominal distention and anal bleeding.  Genitourinary: Negative for dysuria, hematuria, flank pain and difficulty urinating.  Musculoskeletal: Negative for myalgias, back pain, joint swelling, arthralgias and gait problem.  Skin: Negative for color change, pallor, rash and wound.  Neurological: Negative for dizziness, tremors, weakness and light-headedness.  Hematological: Negative for adenopathy. Does not bruise/bleed easily.  Psychiatric/Behavioral: Negative for behavioral problems, confusion, sleep disturbance, dysphoric mood, decreased concentration and agitation.       Objective:   Physical Exam BP 124/87  Pulse 69  Temp 98.3 F (36.8 C) (Oral)  Wt 239 lb (108.41 kg) Physical Exam  Constitutional: He is oriented to person, place, and time. He appears well-developed and well-nourished. No distress.  HENT:  Mouth/Throat: Oropharynx is clear and moist. No oropharyngeal exudate.  Cardiovascular: Normal rate, regular rhythm and normal heart sounds. Exam reveals no gallop and no friction rub.  No murmur heard.  Pulmonary/Chest: Effort normal and breath sounds normal. No respiratory distress. He has no wheezes.  Abdominal: Soft. Bowel sounds are normal. He exhibits no distension. There is no tenderness.  Lymphadenopathy:  He has no cervical adenopathy.  Neurological: He is alert and oriented to person, place, and time.  Skin: Skin is warm and dry. No rash noted. No erythema.  Psychiatric: He has a normal mood and affect. His behavior is  normal.       Assessment & Plan:  HIV= will likely start him on stribild. He is concerned about out of pocket cost. Will give co-pay card. If his out of pocket cost > $200; will discuss alternative therapy  Dyslipidemia = HDL 23, increase dietary intake of omega  3/fish/fish oil  Health maintenance = will give flu vaccine at this visit; will get [facility] records to see if he is uptodate with pneumococcal vaccines.  HIV prevention = continue with condom use.  rtc in 4-6 wk.

## 2012-06-07 ENCOUNTER — Ambulatory Visit: Payer: BC Managed Care – PPO | Admitting: Internal Medicine

## 2012-07-11 ENCOUNTER — Other Ambulatory Visit: Payer: Self-pay | Admitting: *Deleted

## 2012-07-11 ENCOUNTER — Telehealth: Payer: Self-pay | Admitting: *Deleted

## 2012-07-11 DIAGNOSIS — B20 Human immunodeficiency virus [HIV] disease: Secondary | ICD-10-CM

## 2012-07-11 MED ORDER — ELVITEG-COBIC-EMTRICIT-TENOFDF 150-150-200-300 MG PO TABS: 1.0000 | ORAL_TABLET | Freq: Every day | ORAL | Status: DC

## 2012-07-11 NOTE — Telephone Encounter (Signed)
Patient's insurance will only cover Stribild at about 50%, leaving him a copay each month after the copay card of $1,000.00. He was wondering if there were any other regimens that you could suggest that his insurance may cover. He said a friend has similar insurance and is on 3 pills and the insurance covers them completely. Please advise Eric Chapman

## 2012-07-23 NOTE — Telephone Encounter (Signed)
Patient does not have a phone, if he calls back we need to get updated number. Eric Chapman

## 2013-01-31 ENCOUNTER — Telehealth: Payer: Self-pay | Admitting: *Deleted

## 2013-01-31 NOTE — Telephone Encounter (Signed)
Requested pt call for MD appt.

## 2013-02-22 ENCOUNTER — Ambulatory Visit (INDEPENDENT_AMBULATORY_CARE_PROVIDER_SITE_OTHER): Payer: BC Managed Care – PPO | Admitting: Family Medicine

## 2013-02-22 VITALS — BP 115/70 | HR 74 | Temp 98.1°F | Resp 16 | Ht 69.5 in | Wt 236.0 lb

## 2013-02-22 DIAGNOSIS — L0291 Cutaneous abscess, unspecified: Secondary | ICD-10-CM

## 2013-02-22 DIAGNOSIS — S61451A Open bite of right hand, initial encounter: Secondary | ICD-10-CM

## 2013-02-22 DIAGNOSIS — L0390 Cellulitis, unspecified: Secondary | ICD-10-CM

## 2013-02-22 DIAGNOSIS — Z23 Encounter for immunization: Secondary | ICD-10-CM

## 2013-02-22 DIAGNOSIS — S61409A Unspecified open wound of unspecified hand, initial encounter: Secondary | ICD-10-CM

## 2013-02-22 MED ORDER — CEFTRIAXONE SODIUM 1 G IJ SOLR
1.0000 g | Freq: Once | INTRAMUSCULAR | Status: AC
  Administered 2013-02-22: 1 g via INTRAMUSCULAR

## 2013-02-22 MED ORDER — AMOXICILLIN-POT CLAVULANATE 875-125 MG PO TABS: 1.0000 | ORAL_TABLET | Freq: Two times a day (BID) | ORAL | Status: DC

## 2013-02-22 NOTE — Patient Instructions (Signed)
Keep hand clean. Cover wound as directed  Find out the full history on the dog's vaccines  Return in 2 days, sooner if abruptly worse.   Animal Bite An animal bite can result in a scratch on the skin, deep open cut, puncture of the skin, crush injury, or tearing away of the skin or a body part. Dogs are responsible for most animal bites. Children are bitten more often than adults. An animal bite can range from very mild to more serious. A small bite from your house pet is no cause for alarm. However, some animal bites can become infected or injure a bone or other tissue. You must seek medical care if:  The skin is broken and bleeding does not slow down or stop after 15 minutes.  The puncture is deep and difficult to clean (such as a cat bite).  Pain, warmth, redness, or pus develops around the wound.  The bite is from a stray animal or rodent. There may be a risk of rabies infection.  The bite is from a snake, raccoon, skunk, fox, coyote, or bat. There may be a risk of rabies infection.  The person bitten has a chronic illness such as diabetes, liver disease, or cancer, or the person takes medicine that lowers the immune system.  There is concern about the location and severity of the bite. It is important to clean and protect an animal bite wound right away to prevent infection. Follow these steps:  Clean the wound with plenty of water and soap.  Apply an antibiotic cream.  Apply gentle pressure over the wound with a clean towel or gauze to slow or stop bleeding.  Elevate the affected area above the heart to help stop any bleeding.  Seek medical care. Getting medical care within 8 hours of the animal bite leads to the best possible outcome. DIAGNOSIS  Your caregiver will most likely:  Take a detailed history of the animal and the bite injury.  Perform a wound exam.  Take your medical history. Blood tests or X-rays may be performed. Sometimes, infected bite wounds are  cultured and sent to a lab to identify the infectious bacteria.  TREATMENT  Medical treatment will depend on the location and type of animal bite as well as the patient's medical history. Treatment may include:  Wound care, such as cleaning and flushing the wound with saline solution, bandaging, and elevating the affected area.  Antibiotics.  Tetanus immunization.  Rabies immunization.  Leaving the wound open to heal. This is often done with animal bites, due to the high risk of infection. However, in certain cases, wound closure with stitches, wound adhesive, skin adhesive strips, or staples may be used. Infected bites that are left untreated may require intravenous (IV) antibiotics and surgical treatment in the hospital. HOME CARE INSTRUCTIONS  Follow your caregiver's instructions for wound care.  Take all medicines as directed.  If your caregiver prescribes antibiotics, take them as directed. Finish them even if you start to feel better.  Follow up with your caregiver for further exams or immunizations as directed. You may need a tetanus shot if:  You cannot remember when you had your last tetanus shot.  You have never had a tetanus shot.  The injury broke your skin. If you get a tetanus shot, your arm may swell, get red, and feel warm to the touch. This is common and not a problem. If you need a tetanus shot and you choose not to have one, there is  a rare chance of getting tetanus. Sickness from tetanus can be serious. SEEK MEDICAL CARE IF:  You notice warmth, redness, soreness, swelling, pus discharge, or a bad smell coming from the wound.  You have a red line on the skin coming from the wound.  You have a fever, chills, or a general ill feeling.  You have nausea or vomiting.  You have continued or worsening pain.  You have trouble moving the injured part.  You have other questions or concerns. MAKE SURE YOU:  Understand these instructions.  Will watch your  condition.  Will get help right away if you are not doing well or get worse. Document Released: 11/30/2010 Document Revised: 06/06/2011 Document Reviewed: 11/30/2010 [facility] Patient Information 2014 [place], [place].

## 2013-02-22 NOTE — Progress Notes (Signed)
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 He had an encounter with a dog 2 days ago. He had padded the little dog and its cage in the house where he was working. He is known the dog for several years. Before he left he reached in to get one followup at the dog and say goodbye, and the dog bit his finger. He is bit on the middle finger of the right hand, with a couple of tooth mark lacerations on either side of the finger. Over the last 48 hours the hand is developed redness. The dog has had its vaccinations, but exact dates are not yet clear. The patient is going to confirm the exact dates of the dogs shots.  The patient is HIV positive, with CT for count of around 300, currently not on medications due to insurance issues.  Objective: Tooth mark lacerations/puncture wounds on either side of the right middle finger. There is erythema of the finger. The whole hand little bit red. It is not really well demarcated and the palm of the hand is flushed so it is a little hard to see exactly where there is extends to. There is none above the wrist.  Assessment Dog bite with cellulitis Need for updated tetanus shot  Plan: Tetanus shot Rocephin 1 g IM Began on Augmentin 875 twice daily Recheck in 2 days, sooner if in all worse

## 2013-02-26 LAB — WOUND CULTURE: Gram Stain: NONE SEEN

## 2013-02-28 ENCOUNTER — Encounter: Payer: Self-pay | Admitting: *Deleted

## 2013-03-26 ENCOUNTER — Telehealth: Payer: Self-pay | Admitting: *Deleted

## 2013-03-26 ENCOUNTER — Encounter: Payer: Self-pay | Admitting: *Deleted

## 2013-03-26 NOTE — Telephone Encounter (Signed)
Sent letter to patient address on file as there is no working number in the chart for me to call about an appt.

## 2013-06-10 ENCOUNTER — Other Ambulatory Visit: Payer: BC Managed Care – PPO

## 2013-06-10 ENCOUNTER — Other Ambulatory Visit: Payer: Self-pay | Admitting: Internal Medicine

## 2013-06-10 DIAGNOSIS — B20 Human immunodeficiency virus [HIV] disease: Secondary | ICD-10-CM

## 2013-06-10 LAB — CBC WITH DIFFERENTIAL/PLATELET
BASOS ABS: 0 10*3/uL (ref 0.0–0.1)
BASOS PCT: 0 % (ref 0–1)
EOS PCT: 6 % — AB (ref 0–5)
Eosinophils Absolute: 0.3 10*3/uL (ref 0.0–0.7)
HEMATOCRIT: 42.2 % (ref 39.0–52.0)
Hemoglobin: 14.6 g/dL (ref 13.0–17.0)
Lymphocytes Relative: 34 % (ref 12–46)
Lymphs Abs: 2 10*3/uL (ref 0.7–4.0)
MCH: 28.2 pg (ref 26.0–34.0)
MCHC: 34.6 g/dL (ref 30.0–36.0)
MCV: 81.5 fL (ref 78.0–100.0)
MONO ABS: 0.6 10*3/uL (ref 0.1–1.0)
Monocytes Relative: 11 % (ref 3–12)
Neutro Abs: 2.8 10*3/uL (ref 1.7–7.7)
Neutrophils Relative %: 49 % (ref 43–77)
Platelets: 286 10*3/uL (ref 150–400)
RBC: 5.18 MIL/uL (ref 4.22–5.81)
RDW: 13.6 % (ref 11.5–15.5)
WBC: 5.8 10*3/uL (ref 4.0–10.5)

## 2013-06-10 LAB — COMPREHENSIVE METABOLIC PANEL
ALK PHOS: 30 U/L — AB (ref 39–117)
ALT: 30 U/L (ref 0–53)
AST: 20 U/L (ref 0–37)
Albumin: 4 g/dL (ref 3.5–5.2)
BUN: 10 mg/dL (ref 6–23)
CALCIUM: 9.3 mg/dL (ref 8.4–10.5)
CO2: 27 mEq/L (ref 19–32)
CREATININE: 0.83 mg/dL (ref 0.50–1.35)
Chloride: 102 mEq/L (ref 96–112)
GLUCOSE: 90 mg/dL (ref 70–99)
POTASSIUM: 4.4 meq/L (ref 3.5–5.3)
Sodium: 135 mEq/L (ref 135–145)
Total Bilirubin: 0.4 mg/dL (ref 0.2–1.2)
Total Protein: 8 g/dL (ref 6.0–8.3)

## 2013-06-10 LAB — RPR

## 2013-06-11 LAB — T-HELPER CELL (CD4) - (RCID CLINIC ONLY)
CD4 % Helper T Cell: 9 % — ABNORMAL LOW (ref 33–55)
CD4 T Cell Abs: 200 /uL — ABNORMAL LOW (ref 400–2700)

## 2013-06-11 LAB — HIV-1 RNA QUANT-NO REFLEX-BLD
HIV 1 RNA Quant: 155703 copies/mL — ABNORMAL HIGH (ref <20)
HIV-1 RNA QUANT, LOG: 5.19 {Log} — AB (ref <1.30)

## 2013-06-11 LAB — URINE CYTOLOGY ANCILLARY ONLY
CHLAMYDIA, DNA PROBE: NEGATIVE
Neisseria Gonorrhea: NEGATIVE

## 2013-06-14 ENCOUNTER — Other Ambulatory Visit: Payer: Self-pay | Admitting: Internal Medicine

## 2013-06-14 DIAGNOSIS — B20 Human immunodeficiency virus [HIV] disease: Secondary | ICD-10-CM

## 2013-06-20 ENCOUNTER — Encounter: Payer: Self-pay | Admitting: Internal Medicine

## 2013-06-20 ENCOUNTER — Ambulatory Visit (INDEPENDENT_AMBULATORY_CARE_PROVIDER_SITE_OTHER): Payer: BC Managed Care – PPO | Admitting: Internal Medicine

## 2013-06-20 VITALS — BP 141/92 | HR 73 | Temp 98.3°F | Wt 235.0 lb

## 2013-06-20 DIAGNOSIS — B20 Human immunodeficiency virus [HIV] disease: Secondary | ICD-10-CM

## 2013-06-20 MED ORDER — SULFAMETHOXAZOLE-TRIMETHOPRIM 400-80 MG PO TABS: 1.0000 | ORAL_TABLET | Freq: Every day | ORAL | Status: DC

## 2013-06-20 MED ORDER — ELVITEG-COBIC-EMTRICIT-TENOFDF 150-150-200-300 MG PO TABS: 1.0000 | ORAL_TABLET | Freq: Every day | ORAL | Status: DC

## 2013-06-20 NOTE — Progress Notes (Signed)
Subjective:    Patient ID: Eric SereneJames T Sokolov Jr., male    DOB: 03/11/1966, 48 y.o.   MRN: [id number]  HPI Eric Chapman is a 48 yo Male, with long standing HIV Disease, first diagnosed in 1998, CD 4 count nadir of 180 at diagnosis. RF for HIV, MSM. Current CD 4 count 200/ VL 155,703, genotype reverted to wildtype. He has been off of HIV meds for the past 2824yr+ due to poor insurance. He remembers previously on Atripla but had become resistant (although K103N not found).   Since we saw him in January, He had difficulty getting access to stribild due to high out of pocket cost.subsequently, we had difficulty getting in touch with him since we did not have his cell phone. He is back in clinic for further option  He has noticed having rectal pain with bowel movements, but also states that he has some pain without BM. He has not noticed blood in the toilet bowel or streaking on stool. No anal sex. No hx of anal warts  No current outpatient prescriptions on file prior to visit.   No current facility-administered medications on file prior to visit.   Active Ambulatory Problems    Diagnosis Date Noted  . HIV DISEASE 02/10/2006  . GASTROENTERITIS 02/10/2006  . ILEUS 02/10/2006  . Abdominal pain, R sided periumbilical 05/25/2011  . History of kidney stones 08/12/2011   Resolved Ambulatory Problems    Diagnosis Date Noted  . No Resolved Ambulatory Problems   Past Medical History  Diagnosis Date  . HIV infection   . RLQ abdominal pain   . Chronic kidney disease        Review of Systems Gi issues plus fatigue. 10 point ros is negative    Objective:   Physical Exam BP 141/92  Pulse 73  Temp(Src) 98.3 F (36.8 C) (Oral)  Wt 235 lb (106.595 kg) Physical Exam  Constitutional: He is oriented to person, place, and time. He appears well-developed and well-nourished. No distress.  HENT: oral hairy leukoplakia on sides of tongue no signs of thrush Mouth/Throat: Oropharynx is clear and moist. No  oropharyngeal exudate.  Cardiovascular: Normal rate, regular rhythm and normal heart sounds. Exam reveals no gallop and no friction rub.  No murmur heard.  Pulmonary/Chest: Effort normal and breath sounds normal. No respiratory distress. He has no wheezes.  Abdominal: Soft. Bowel sounds are normal. He exhibits no distension. There is no tenderness.  Lymphadenopathy:  He has no cervical adenopathy.  Rectal exam = external hemorrhoid not inflammed ? Possible flat wart at 2 o'clock Neurological: He is alert and oriented to person, place, and time.  Skin: Skin is warm and dry. No rash noted. No erythema.  Psychiatric: He has a normal mood and affect. His behavior is normal.       Assessment & Plan:  hiv = will start stribild since we determined that he can go to specialty pharmacy with out of pocket cost of $45 then copay card to cover the addn co-pay. This has been explained to the patient.  abd discomfort = somewhat poor historian, will try miralax to regulate his bowel movement so that it is not constipated, if not improved next week, will screen for STDs. may need to refer to gi for further eval  oi proph = will need to start bactrim ss daily since cd 4  Count 20 and 6%  htn = will see if need to start meds at next visit  rtc in 4 wk

## 2013-07-23 ENCOUNTER — Encounter: Payer: Self-pay | Admitting: Internal Medicine

## 2013-07-23 ENCOUNTER — Ambulatory Visit (INDEPENDENT_AMBULATORY_CARE_PROVIDER_SITE_OTHER): Payer: BC Managed Care – PPO | Admitting: Internal Medicine

## 2013-07-23 VITALS — BP 121/86 | HR 85 | Temp 97.3°F | Wt 230.0 lb

## 2013-07-23 DIAGNOSIS — B20 Human immunodeficiency virus [HIV] disease: Secondary | ICD-10-CM

## 2013-07-23 LAB — CBC WITH DIFFERENTIAL/PLATELET
Basophils Absolute: 0.1 10*3/uL (ref 0.0–0.1)
Basophils Relative: 1 % (ref 0–1)
EOS ABS: 0.4 10*3/uL (ref 0.0–0.7)
EOS PCT: 6 % — AB (ref 0–5)
HEMATOCRIT: 42.7 % (ref 39.0–52.0)
Hemoglobin: 14.8 g/dL (ref 13.0–17.0)
Lymphocytes Relative: 40 % (ref 12–46)
Lymphs Abs: 2.4 10*3/uL (ref 0.7–4.0)
MCH: 28.4 pg (ref 26.0–34.0)
MCHC: 34.7 g/dL (ref 30.0–36.0)
MCV: 82 fL (ref 78.0–100.0)
MONOS PCT: 10 % (ref 3–12)
Monocytes Absolute: 0.6 10*3/uL (ref 0.1–1.0)
Neutro Abs: 2.5 10*3/uL (ref 1.7–7.7)
Neutrophils Relative %: 43 % (ref 43–77)
Platelets: 323 10*3/uL (ref 150–400)
RBC: 5.21 MIL/uL (ref 4.22–5.81)
RDW: 14.3 % (ref 11.5–15.5)
WBC: 5.9 10*3/uL (ref 4.0–10.5)

## 2013-07-23 LAB — COMPLETE METABOLIC PANEL WITH GFR
ALBUMIN: 4.3 g/dL (ref 3.5–5.2)
ALT: 28 U/L (ref 0–53)
AST: 19 U/L (ref 0–37)
Alkaline Phosphatase: 32 U/L — ABNORMAL LOW (ref 39–117)
BUN: 12 mg/dL (ref 6–23)
CALCIUM: 9.5 mg/dL (ref 8.4–10.5)
CO2: 27 meq/L (ref 19–32)
CREATININE: 0.94 mg/dL (ref 0.50–1.35)
Chloride: 103 mEq/L (ref 96–112)
GFR, Est Non African American: >89 mL/min
Glucose, Bld: 88 mg/dL (ref 70–99)
POTASSIUM: 4.2 meq/L (ref 3.5–5.3)
Sodium: 136 mEq/L (ref 135–145)
Total Bilirubin: 0.4 mg/dL (ref 0.2–1.2)
Total Protein: 8.1 g/dL (ref 6.0–8.3)

## 2013-07-23 NOTE — Progress Notes (Signed)
   Subjective:    Patient ID: Eric SereneJames T Soldo Jr., male    DOB: 09/08/1965, 48 y.o.   MRN: [id number]  HPI 47yo M with HIV, CD 4 count of 200/VL 155,703 on stribild x 1 month, he reports 3 doses in the last month. Doing well thus far. Continues on bactrim for OI proph.  Current Outpatient Prescriptions on File Prior to Visit  Medication Sig Dispense Refill  . elvitegravir-cobicistat-emtricitabine-tenofovir (STRIBILD) 150-150-200-300 MG TABS tablet Take 1 tablet by mouth daily with breakfast.  30 tablet  11  . sulfamethoxazole-trimethoprim (SEPTRA) 400-80 MG per tablet Take 1 tablet by mouth daily.  30 tablet  3   No current facility-administered medications on file prior to visit.   Active Ambulatory Problems    Diagnosis Date Noted  . HIV DISEASE 02/10/2006  . GASTROENTERITIS 02/10/2006  . ILEUS 02/10/2006  . Abdominal pain, R sided periumbilical 05/25/2011  . History of kidney stones 08/12/2011   Resolved Ambulatory Problems    Diagnosis Date Noted  . No Resolved Ambulatory Problems   Past Medical History  Diagnosis Date  . HIV infection   . RLQ abdominal pain   . Chronic kidney disease       Review of Systems 10 point ros is negative except for left toe pain which is improving    Objective:   Physical Exam BP 121/86  Pulse 85  Temp(Src) 97.3 F (36.3 C) (Oral)  Wt 230 lb (104.327 kg) Physical Exam  Constitutional: He is oriented to person, place, and time. He appears well-developed and well-nourished. No distress.  HENT:  Mouth/Throat: Oropharynx is clear and moist. No oropharyngeal exudate.  Cardiovascular: Normal rate, regular rhythm and normal heart sounds. Exam reveals no gallop and no friction rub.  No murmur heard.  Pulmonary/Chest: Effort normal and breath sounds normal. No respiratory distress. He has no wheezes.  Lymphadenopathy:  He has no cervical adenopathy.  Neurological: He is alert and oriented to person, place, and time.  Skin: Skin is warm and  dry. No rash noted. No erythema.  Psychiatric: He has a normal mood and affect. His behavior is normal.       Assessment & Plan:  HIV = will check his labs today. Continue on stribild. Counseled on better adherence  oi proph = continue for 2 addn months then stop.  rtc in 2 months

## 2013-07-24 LAB — HIV-1 RNA QUANT-NO REFLEX-BLD
HIV 1 RNA QUANT: 160 {copies}/mL — AB (ref <20)
HIV-1 RNA QUANT, LOG: 2.2 {Log} — AB (ref <1.30)

## 2013-07-24 LAB — T-HELPER CELL (CD4) - (RCID CLINIC ONLY)
CD4 % Helper T Cell: 10 % — ABNORMAL LOW (ref 33–55)
CD4 T Cell Abs: 240 /uL — ABNORMAL LOW (ref 400–2700)

## 2013-08-12 ENCOUNTER — Ambulatory Visit (INDEPENDENT_AMBULATORY_CARE_PROVIDER_SITE_OTHER): Payer: BC Managed Care – PPO | Admitting: Family Medicine

## 2013-08-12 ENCOUNTER — Ambulatory Visit: Payer: BC Managed Care – PPO

## 2013-08-12 VITALS — BP 126/80 | HR 71 | Temp 98.3°F | Resp 16 | Ht 70.0 in | Wt 230.2 lb

## 2013-08-12 DIAGNOSIS — M25579 Pain in unspecified ankle and joints of unspecified foot: Secondary | ICD-10-CM

## 2013-08-12 DIAGNOSIS — T148XXA Other injury of unspecified body region, initial encounter: Secondary | ICD-10-CM

## 2013-08-12 MED ORDER — DICLOFENAC SODIUM 1 % TD GEL: 2.0000 g | Freq: Three times a day (TID) | TRANSDERMAL | Status: DC | PRN

## 2013-08-12 NOTE — Progress Notes (Signed)
 Chief Complaint:  Chief Complaint  Patient presents with  . Foot Pain    rt foot pain x 1 mth    HPI: Eric Chapman. is a 48 y.o. male who is here for 1 month history of right foot, middle toes, stinging pain, woke him up in his sleep. He has not taken anything for this. He has pain at the bottom of his forefoot . NKI. He deneis diabetes, recent blood work supports this, he has HIV and has been on [place] for the last 7 weeks so not medication related thathe knows of. He denies any history of gout or PF. Denies fevers, chills. Has tried motrin and tylenol without relief. .   Past Medical History  Diagnosis Date  . HIV infection   . RLQ abdominal pain   . Chronic kidney disease    Past Surgical History  Procedure Laterality Date  . Hernia repair  2009    Open primary repair Dr. Hulen Skains  . Hernia repair  02/2010    Lap [place]  repair with mesh  . Tonsillectomy     History   Social History  . Marital Status: Single    Spouse Name: N/A    Number of Children: N/A  . Years of Education: N/A   Social History Main Topics  . Smoking status: Never Smoker   . Smokeless tobacco: Never Used  . Alcohol Use: No  . Drug Use: No  . Sexual Activity: Yes     Comment: decined condoms   Other Topics Concern  . None   Social History Narrative  . None   Family History  Problem Relation Age of Onset  . Cancer Father    Allergies  Allergen Reactions  . Percocet [Oxycodone-Acetaminophen] Itching and Other (See Comments)    Extreme agitation   Prior to Admission medications   Medication Sig Start Date End Date Taking? Authorizing Provider  elvitegravir-cobicistat-emtricitabine-tenofovir (STRIBILD) 150-150-200-300 MG TABS tablet Take 1 tablet by mouth daily with breakfast. 06/20/13  Yes Carlyle Basques, MD  sulfamethoxazole-trimethoprim (SEPTRA) 400-80 MG per tablet Take 1 tablet by mouth daily. 06/20/13   Carlyle Basques, MD     ROS: The patient denies fevers, chills, night  sweats, unintentional weight loss, chest pain, palpitations, wheezing, dyspnea on exertion, nausea, vomiting, abdominal pain, dysuria, hematuria, melena, numbness, weakness, or tingling.   All other systems have been reviewed and were otherwise negative with the exception of those mentioned in the HPI and as above.    PHYSICAL EXAM: Filed Vitals:   08/12/13 0850  BP: 126/80  Pulse: 71  Temp: 98.3 F (36.8 C)  Resp: 16   Filed Vitals:   08/12/13 0850  Height: $Remove'5\' 10"'OqyOyMb$  (1.778 m)  Weight: 230 lb 3.2 oz (104.418 kg)   Body mass index is 33.03 kg/(m^2).  General: Alert, no acute distress HEENT:  Normocephalic, atraumatic, oropharynx patent. EOMI, PERRLA Cardiovascular:  Regular rate and rhythm, no rubs murmurs or gallops.  No Carotid bruits, radial pulse intact.  Respiratory: Clear to auscultation bilaterally.  No wheezes, rales, or rhonchi.  No cyanosis, no use of accessory musculature GI: No organomegaly, abdomen is soft and non-tender, positive bowel sounds.  No masses. Skin: No rashes. Neurologic: Facial musculature symmetric. Psychiatric: Patient is appropriate throughout our interaction. Lymphatic: No cervical lymphadenopathy Musculoskeletal: Gait intact. Right foot-+ DP, Minimal swelling top of distal foot ( minimal) and on bottom of foot, deneis pain along PF or achilles, no warmth , no erythema  Minimally tender onpalpation, Full ROM, 5/5 strength, sensation intact   LABS: Results for orders placed in visit on 07/23/13  CBC WITH DIFFERENTIAL      Result Value Ref Range   WBC 5.9  4.0 - 10.5 K/uL   RBC 5.21  4.22 - 5.81 MIL/uL   Hemoglobin 14.8  13.0 - 17.0 g/dL   HCT 42.7  39.0 - 52.0 %   MCV 82.0  78.0 - 100.0 fL   MCH 28.4  26.0 - 34.0 pg   MCHC 34.7  30.0 - 36.0 g/dL   RDW 14.3  11.5 - 15.5 %   Platelets 323  150 - 400 K/uL   Neutrophils Relative % 43  43 - 77 %   Neutro Abs 2.5  1.7 - 7.7 K/uL   Lymphocytes Relative 40  12 - 46 %   Lymphs Abs 2.4  0.7 - 4.0  K/uL   Monocytes Relative 10  3 - 12 %   Monocytes Absolute 0.6  0.1 - 1.0 K/uL   Eosinophils Relative 6 (*) 0 - 5 %   Eosinophils Absolute 0.4  0.0 - 0.7 K/uL   Basophils Relative 1  0 - 1 %   Basophils Absolute 0.1  0.0 - 0.1 K/uL   Smear Review Criteria for review not met    COMPLETE METABOLIC PANEL WITH GFR      Result Value Ref Range   Sodium 136  135 - 145 mEq/L   Potassium 4.2  3.5 - 5.3 mEq/L   Chloride 103  96 - 112 mEq/L   CO2 27  19 - 32 mEq/L   Glucose, Bld 88  70 - 99 mg/dL   BUN 12  6 - 23 mg/dL   Creat 0.94  0.50 - 1.35 mg/dL   Total Bilirubin 0.4  0.2 - 1.2 mg/dL   Alkaline Phosphatase 32 (*) 39 - 117 U/L   AST 19  0 - 37 U/L   ALT 28  0 - 53 U/L   Total Protein 8.1  6.0 - 8.3 g/dL   Albumin 4.3  3.5 - 5.2 g/dL   Calcium 9.5  8.4 - 10.5 mg/dL   GFR, Est African American >89     GFR, Est Non African American >89    HIV 1 RNA QUANT-NO REFLEX-BLD      Result Value Ref Range   HIV 1 RNA Quant 160 (*) <20 copies/mL   HIV1 RNA Quant, Log 2.20 (*) <1.30 log 10  T-HELPER CELL (CD4)      Result Value Ref Range   CD4 T Cell Abs 240 (*) 400 - 2700 /uL   CD4 % Helper T Cell 10 (*) 33 - 55 %     EKG/XRAY:   Primary read interpreted by Dr. Marin Comment at [facility]. Neg for fracture or dislocation   ASSESSMENT/PLAN: Encounter Diagnoses  Name Primary?  . Pain in joint, ankle and foot Yes  . Sprain and strain    ? MTP sprain/strain vs PF Advise to try metatarsal pads He has a hx of CKD and is on meds that can adversely affect kidneys and liver Will rx diclofeance gel F/u prn  Gross sideeffects, risk and benefits, and alternatives of medications d/w patient. Patient is aware that all medications have potential sideeffects and we are unable to predict every sideeffect or drug-drug interaction that may occur.  [place] , DO 08/12/2013 10:02 AM

## 2013-08-22 ENCOUNTER — Telehealth: Payer: Self-pay | Admitting: *Deleted

## 2013-08-22 NOTE — Telephone Encounter (Signed)
Left patient a voice mail to return my call regarding scheduling an appointment for the [facility] clinic with Dr. Ninetta Lights on 09/13/13. Eric Chapman

## 2013-09-25 ENCOUNTER — Ambulatory Visit: Payer: BC Managed Care – PPO | Admitting: Internal Medicine

## 2013-11-29 ENCOUNTER — Ambulatory Visit (INDEPENDENT_AMBULATORY_CARE_PROVIDER_SITE_OTHER): Payer: BC Managed Care – PPO | Admitting: Family Medicine

## 2013-11-29 VITALS — BP 122/80 | HR 80 | Temp 98.2°F | Resp 16 | Ht 69.75 in | Wt 226.0 lb

## 2013-11-29 DIAGNOSIS — J029 Acute pharyngitis, unspecified: Secondary | ICD-10-CM

## 2013-11-29 DIAGNOSIS — Z9109 Other allergy status, other than to drugs and biological substances: Secondary | ICD-10-CM

## 2013-11-29 DIAGNOSIS — Z7729 Contact with and (suspected ) exposure to other hazardous substances: Secondary | ICD-10-CM

## 2013-11-29 DIAGNOSIS — Z77098 Contact with and (suspected) exposure to other hazardous, chiefly nonmedicinal, chemicals: Secondary | ICD-10-CM

## 2013-11-29 MED ORDER — PREDNISONE 20 MG PO TABS: ORAL_TABLET | ORAL | Status: DC

## 2013-11-29 NOTE — Patient Instructions (Signed)
Drink plenty of fluids  Take an antihistamine such as Allegra or Claritin or Zyrtec one daily  Take the prednisone 3 pills daily for 3 days. They can be taken all at once

## 2013-11-29 NOTE — Progress Notes (Signed)
Subjective: 48 year old man with history of HIV. Last CD4 count was greater than 200 several months ago. Chief has felt well until the last week. He got a sore throat on Monday it is persisted all week. He's not been running any fever. He still some paint thinner in his [employer] and inhaled a lot of the fumes. He thinks that may have induced or aggravated this. Actually he had some symptoms before he spilled a paint thinner.  Objective: No fever. TMs normal. Throat clear. Neck supple without significant nodes. Moderate size submandibular glands. Chest clear. Heart regular. Mild cough.  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

## 2014-01-07 ENCOUNTER — Ambulatory Visit (INDEPENDENT_AMBULATORY_CARE_PROVIDER_SITE_OTHER): Payer: BC Managed Care – PPO | Admitting: Internal Medicine

## 2014-01-07 VITALS — BP 120/82 | HR 85 | Temp 98.3°F | Resp 16 | Ht 70.0 in | Wt 229.0 lb

## 2014-01-07 DIAGNOSIS — T1502XA Foreign body in cornea, left eye, initial encounter: Secondary | ICD-10-CM

## 2014-01-07 DIAGNOSIS — H5712 Ocular pain, left eye: Secondary | ICD-10-CM

## 2014-01-07 DIAGNOSIS — H109 Unspecified conjunctivitis: Secondary | ICD-10-CM

## 2014-01-07 MED ORDER — OFLOXACIN 0.3 % OP SOLN: 1.0000 [drp] | OPHTHALMIC | Status: DC

## 2014-01-07 NOTE — Progress Notes (Signed)
   Subjective:    Patient ID: Eric SereneJames T Kirsch Jr., male    DOB: 05/02/1965, 48 y.o.   MRN: [id number]  HPI    Review of Systems     Objective:   Physical Exam        Assessment & Plan:

## 2014-01-07 NOTE — Patient Instructions (Signed)
Eye, Foreign Body A foreign body is an object that should not be there. The object could be near, on, or in the eye. HOME CARE If your doctor prescribes an eye patch:  Keep the eye patch on. Do this until you see your doctor again.  Do not remove the patch to put in medicine unless your doctor tells you.  Retape it as it was before:  When replacing the patch.  If the patch comes loose.  Do not drive or use machinery.  Only take medicine as told by your doctor. If your doctor does not prescribe an eye patch:  Keep the eye closed as much as possible.  Do not rub the eye.  Wear dark glasses in bright light.  Do not wear contact lenses until the eye feels normal, or as told by your doctor.  Wear protective eye covering, especially when using high speed tools.  Only take medicine as told by your doctor. GET HELP RIGHT AWAY IF:   Your pain gets worse.  Your vision changes.  You have problems with the eye patch.  The injury gets larger.  There is fluid (discharge) coming from the eye.  You get puffiness (swelling) and soreness.  You have an oral temperature above 102 F (38.9 C), not controlled by medicine.  Your baby is older than 3 months with a rectal temperature of 102 F (38.9 C) or higher.  Your baby is 673 months old or younger with a rectal temperature of 100.4 F (38 C) or higher. MAKE SURE YOU:   Understand these instructions.  Will watch your condition.  Will get help right away if you are not doing well or get worse. Document Released: 09/01/2009 Document Revised: 06/06/2011 Document Reviewed: 08/09/2012 [facility] Patient Information 2015 [place], [place]. This information is not intended to replace advice given to you by your health care provider. Make sure you discuss any questions you have with your health care provider. Corneal Abrasion The cornea is the clear covering at the front and center of the eye. When looking at the colored portion of the  eye (iris), you are looking through the cornea. This very thin tissue is made up of many layers. The surface layer is a single layer of cells (corneal epithelium) and is one of the most sensitive tissues in the body. If a scratch or injury causes the corneal epithelium to come off, it is called a corneal abrasion. If the injury extends to the tissues below the epithelium, the condition is called a corneal ulcer. CAUSES   Scratches.  Trauma.  Foreign body in the eye. Some people have recurrences of abrasions in the area of the original injury even after it has healed (recurrent erosion syndrome). Recurrent erosion syndrome generally improves and goes away with time. SYMPTOMS   Eye pain.  Difficulty or inability to keep the injured eye open.  The eye becomes very sensitive to light.  Recurrent erosions tend to happen suddenly, first thing in the morning, usually after waking up and opening the eye. DIAGNOSIS  Your health care provider can diagnose a corneal abrasion during an eye exam. Dye is usually placed in the eye using a drop or a small paper strip moistened by your tears. When the eye is examined with a special light, the abrasion shows up clearly because of the dye. TREATMENT   Small abrasions may be treated with antibiotic drops or ointment alone.  A pressure patch may be put over the eye. If this is done,  follow your doctor's instructions for when to remove the patch. Do not drive or use machines while the eye patch is on. Judging distances is hard to do with a patch on. If the abrasion becomes infected and spreads to the deeper tissues of the cornea, a corneal ulcer can result. This is serious because it can cause corneal scarring. Corneal scars interfere with light passing through the cornea and cause a loss of vision in the involved eye. HOME CARE INSTRUCTIONS  Use medicine or ointment as directed. Only take over-the-counter or prescription medicines for pain, discomfort, or  fever as directed by your health care provider.  Do not drive or operate machinery if your eye is patched. Your ability to judge distances is impaired.  If your health care provider has given you a follow-up appointment, it is very important to keep that appointment. Not keeping the appointment could result in a severe eye infection or permanent loss of vision. If there is any problem keeping the appointment, let your health care provider know. SEEK MEDICAL CARE IF:   You have pain, light sensitivity, and a scratchy feeling in one eye or both eyes.  Your pressure patch keeps loosening up, and you can blink your eye under the patch after treatment.  Any kind of discharge develops from the eye after treatment or if the lids stick together in the morning.  You have the same symptoms in the morning as you did with the original abrasion days, weeks, or months after the abrasion healed. MAKE SURE YOU:   Understand these instructions.  Will watch your condition.  Will get help right away if you are not doing well or get worse. Document Released: 03/11/2000 Document Revised: 03/19/2013 Document Reviewed: 11/19/2012 [facility] Patient Information 2015 [place], [place]. This information is not intended to replace advice given to you by your health care provider. Make sure you discuss any questions you have with your health care provider.

## 2014-01-07 NOTE — Progress Notes (Signed)
   Subjective:    Patient ID: Eric SereneJames T Gangl Jr., male    DOB: 02/18/1966, 48 y.o.   MRN: [id number]  HPI 48 yr old caucasian male presents to clinic with problems with left eye.  Pt has a dog that scratched Left eye while playing.  Pt also does sheet rocking and insulation and is unsure if the origin is from his work.  Pt has redness, burning and itching off and on for a week.  This am there is drainage that has matted up on the left eye.  Pt feels left eye feel warm to touch.  Pt does not feel he has any vision disturbance. Pt feels soreness around the eye for the first time today.    Review of Systems HIV under moderate control    Objective:   Physical Exam  Constitutional: He is oriented to person, place, and time. He appears well-developed and well-nourished. No distress.  HENT:  Head: Normocephalic.  Nose: Nose normal.  Mouth/Throat: Oropharynx is clear and moist.  Eyes: EOM are normal. Pupils are equal, round, and reactive to light. Right eye exhibits no chemosis, no discharge, no exudate and no hordeolum. No foreign body present in the right eye. Left eye exhibits discharge. Left eye exhibits no chemosis, no exudate and no hordeolum. Foreign body present in the left eye. Left conjunctiva is injected. Left conjunctiva has no hemorrhage. No scleral icterus.  Slit lamp exam:      The right eye shows no corneal abrasion, no foreign body and no fluorescein uptake.       The left eye shows corneal abrasion, foreign body and fluorescein uptake. The left eye shows no corneal ulcer.    opthain used Lids everted and FB found central axis left cornea No other uptake on cornea  #27 blade removed corneal fb, no residual rust  Ocuflox drops applied  Neck: Normal range of motion. Neck supple.  Pulmonary/Chest: Effort normal.  Musculoskeletal: Normal range of motion.  Lymphadenopathy:    He has no cervical adenopathy.  Neurological: He is alert and oriented to person, place, and time. He  exhibits normal muscle tone. Coordination normal.  Psychiatric: He has a normal mood and affect. His behavior is normal. Judgment and thought content normal.          Assessment & Plan:  FB cornea embedded Conjunctivitis mild Removed FB/Ocuflox started RTC 1day if not much better

## 2014-03-14 ENCOUNTER — Ambulatory Visit (INDEPENDENT_AMBULATORY_CARE_PROVIDER_SITE_OTHER): Payer: BC Managed Care – PPO | Admitting: Family Medicine

## 2014-03-14 ENCOUNTER — Ambulatory Visit (INDEPENDENT_AMBULATORY_CARE_PROVIDER_SITE_OTHER): Payer: BC Managed Care – PPO

## 2014-03-14 ENCOUNTER — Inpatient Hospital Stay (HOSPITAL_COMMUNITY)
Admission: EM | Admit: 2014-03-14 | Discharge: 2014-03-18 | DRG: 975 | Disposition: A | Discharge status: Home / Self Care | Payer: BC Managed Care – PPO | Attending: Internal Medicine | Admitting: Internal Medicine

## 2014-03-14 ENCOUNTER — Encounter (HOSPITAL_COMMUNITY): Payer: Self-pay | Admitting: Emergency Medicine

## 2014-03-14 ENCOUNTER — Emergency Department (HOSPITAL_COMMUNITY): Payer: BC Managed Care – PPO

## 2014-03-14 VITALS — BP 120/82 | HR 120 | Temp 100.2°F | Resp 36

## 2014-03-14 DIAGNOSIS — K529 Noninfective gastroenteritis and colitis, unspecified: Secondary | ICD-10-CM | POA: Diagnosis present

## 2014-03-14 DIAGNOSIS — R112 Nausea with vomiting, unspecified: Secondary | ICD-10-CM | POA: Diagnosis present

## 2014-03-14 DIAGNOSIS — R509 Fever, unspecified: Secondary | ICD-10-CM

## 2014-03-14 DIAGNOSIS — G4700 Insomnia, unspecified: Secondary | ICD-10-CM | POA: Diagnosis present

## 2014-03-14 DIAGNOSIS — R197 Diarrhea, unspecified: Secondary | ICD-10-CM

## 2014-03-14 DIAGNOSIS — K560 Paralytic ileus: Secondary | ICD-10-CM | POA: Diagnosis present

## 2014-03-14 DIAGNOSIS — R6510 Systemic inflammatory response syndrome (SIRS) of non-infectious origin without acute organ dysfunction: Secondary | ICD-10-CM

## 2014-03-14 DIAGNOSIS — A419 Sepsis, unspecified organism: Secondary | ICD-10-CM | POA: Diagnosis not present

## 2014-03-14 DIAGNOSIS — R109 Unspecified abdominal pain: Secondary | ICD-10-CM | POA: Insufficient documentation

## 2014-03-14 DIAGNOSIS — B20 Human immunodeficiency virus [HIV] disease: Secondary | ICD-10-CM | POA: Diagnosis present

## 2014-03-14 DIAGNOSIS — Z9114 Patient's other noncompliance with medication regimen: Secondary | ICD-10-CM | POA: Diagnosis present

## 2014-03-14 DIAGNOSIS — R1111 Vomiting without nausea: Secondary | ICD-10-CM

## 2014-03-14 DIAGNOSIS — Z9119 Patient's noncompliance with other medical treatment and regimen: Secondary | ICD-10-CM | POA: Diagnosis present

## 2014-03-14 DIAGNOSIS — Z21 Asymptomatic human immunodeficiency virus [HIV] infection status: Secondary | ICD-10-CM

## 2014-03-14 DIAGNOSIS — R000 Tachycardia, unspecified: Secondary | ICD-10-CM

## 2014-03-14 LAB — POCT CBC
Granulocyte percent: 90 %G — AB (ref 37–80)
HCT, POC: 51.7 % (ref 43.5–53.7)
Hemoglobin: 16.7 g/dL (ref 14.1–18.1)
LYMPH, POC: 1.1 (ref 0.6–3.4)
MCH: 29.4 pg (ref 27–31.2)
MCHC: 32.3 g/dL (ref 31.8–35.4)
MCV: 91 fL (ref 80–97)
MID (CBC): 0.5 (ref 0–0.9)
MPV: 7.1 fL (ref 0–99.8)
PLATELET COUNT, POC: 313 10*3/uL (ref 142–424)
POC GRANULOCYTE: 14.1 — AB (ref 2–6.9)
POC LYMPH PERCENT: 7.1 %L — AB (ref 10–50)
POC MID %: 2.9 %M (ref 0–12)
RBC: 5.68 M/uL (ref 4.69–6.13)
RDW, POC: 13.2 %
WBC: 15.7 10*3/uL — AB (ref 4.6–10.2)

## 2014-03-14 LAB — COMPREHENSIVE METABOLIC PANEL
ALT: 13 U/L (ref 0–53)
AST: 16 U/L (ref 0–37)
Albumin: 3.5 g/dL (ref 3.5–5.2)
Alkaline Phosphatase: 39 U/L (ref 39–117)
Anion gap: 16 — ABNORMAL HIGH (ref 5–15)
BUN: 12 mg/dL (ref 6–23)
CALCIUM: 8.9 mg/dL (ref 8.4–10.5)
CO2: 21 mEq/L (ref 19–32)
CREATININE: 1.08 mg/dL (ref 0.50–1.35)
Chloride: 99 mEq/L (ref 96–112)
GFR, EST NON AFRICAN AMERICAN: 79 mL/min — AB (ref >90)
GLUCOSE: 119 mg/dL — AB (ref 70–99)
Potassium: 4.2 mEq/L (ref 3.7–5.3)
Sodium: 136 mEq/L — ABNORMAL LOW (ref 137–147)
Total Bilirubin: 0.4 mg/dL (ref 0.3–1.2)
Total Protein: 7.4 g/dL (ref 6.0–8.3)

## 2014-03-14 LAB — CBC WITH DIFFERENTIAL/PLATELET
Basophils Absolute: 0 10*3/uL (ref 0.0–0.1)
Basophils Relative: 0 % (ref 0–1)
EOS PCT: 0 % (ref 0–5)
Eosinophils Absolute: 0 10*3/uL (ref 0.0–0.7)
HEMATOCRIT: 46.3 % (ref 39.0–52.0)
Hemoglobin: 14.9 g/dL (ref 13.0–17.0)
LYMPHS ABS: 1 10*3/uL (ref 0.7–4.0)
Lymphocytes Relative: 8 % — ABNORMAL LOW (ref 12–46)
MCH: 29.7 pg (ref 26.0–34.0)
MCHC: 32.2 g/dL (ref 30.0–36.0)
MCV: 92.4 fL (ref 78.0–100.0)
MONO ABS: 1 10*3/uL (ref 0.1–1.0)
Monocytes Relative: 8 % (ref 3–12)
Neutro Abs: 11 10*3/uL — ABNORMAL HIGH (ref 1.7–7.7)
Neutrophils Relative %: 84 % — ABNORMAL HIGH (ref 43–77)
Platelets: 296 10*3/uL (ref 150–400)
RBC: 5.01 MIL/uL (ref 4.22–5.81)
RDW: 12.6 % (ref 11.5–15.5)
WBC: 13.1 10*3/uL — AB (ref 4.0–10.5)

## 2014-03-14 LAB — I-STAT CG4 LACTIC ACID, ED: LACTIC ACID, VENOUS: 1.74 mmol/L (ref 0.5–2.2)

## 2014-03-14 LAB — URINALYSIS, ROUTINE W REFLEX MICROSCOPIC
Bilirubin Urine: NEGATIVE
Glucose, UA: NEGATIVE mg/dL
HGB URINE DIPSTICK: NEGATIVE
Ketones, ur: NEGATIVE mg/dL
LEUKOCYTES UA: NEGATIVE
Nitrite: NEGATIVE
PROTEIN: NEGATIVE mg/dL
Specific Gravity, Urine: 1.026 (ref 1.005–1.030)
UROBILINOGEN UA: 0.2 mg/dL (ref 0.0–1.0)
pH: 5 (ref 5.0–8.0)

## 2014-03-14 MED ORDER — SODIUM CHLORIDE 0.9 % IV BOLUS (SEPSIS)
1000.0000 mL | Freq: Once | INTRAVENOUS | Status: AC
  Administered 2014-03-14: 1000 mL via INTRAVENOUS

## 2014-03-14 MED ORDER — ACETAMINOPHEN 650 MG RE SUPP
650.0000 mg | RECTAL | Status: DC | PRN
  Filled 2014-03-14 (×2): qty 1

## 2014-03-14 MED ORDER — SODIUM CHLORIDE 0.9 % IV SOLN
INTRAVENOUS | Status: DC
  Administered 2014-03-14: 125 mL/h via INTRAVENOUS

## 2014-03-14 MED ORDER — IBUPROFEN 200 MG PO TABS
600.0000 mg | ORAL_TABLET | Freq: Once | ORAL | Status: AC
  Administered 2014-03-14: 600 mg via ORAL
  Filled 2014-03-14: qty 3

## 2014-03-14 MED ORDER — ACETAMINOPHEN 325 MG PO TABS
650.0000 mg | ORAL_TABLET | Freq: Four times a day (QID) | ORAL | Status: DC | PRN
  Administered 2014-03-14: 650 mg via ORAL
  Filled 2014-03-14: qty 2

## 2014-03-14 NOTE — Progress Notes (Signed)
[facility] [address], [place] [id number] [phone]  Date:  03/14/2014   Name:  Eric SereneJames T Mustin Jr.   DOB:  06/20/1965   MRN:  [id number]  PCP:  Dois DavenportICHTER,KAREN L., MD    Chief Complaint: Emesis; Generalized Body Aches; Fever; and Diarrhea   History of Present Illness:  Eric SereneJames T Demarco Jr. is a 48 y.o. very pleasant male patient who presents with the following:  Today is Friday- he "went to bed 7pm on Wednesday and this is the first time I've been up besides to go to the bathroom."  He was able to eat and drink yesterday, but last night and today he developed vomiting and diarrhea.  He started vomiting this am.  Diarrhea started yesterday.   He has coughed just a bit.   His belly does not hurt in particular.   Admits he has not been good about taking his meds over the last 4 months or so.  He has been traveling and dealing with other things  Patient Active Problem List   Diagnosis Date Noted  . History of kidney stones 08/12/2011  . Abdominal pain, R sided periumbilical 05/25/2011  . HIV DISEASE 02/10/2006  . GASTROENTERITIS 02/10/2006  . ILEUS 02/10/2006    Past Medical History  Diagnosis Date  . HIV infection   . RLQ abdominal pain   . Chronic kidney disease     Past Surgical History  Procedure Laterality Date  . Hernia repair  2009    Open primary repair Dr. Lindie SpruceWyatt  . Hernia repair  02/2010    Lap V[facility]  repair with mesh  . Tonsillectomy      History  Substance Use Topics  . Smoking status: Never Smoker   . Smokeless tobacco: Never Used  . Alcohol Use: No    Family History  Problem Relation Age of Onset  . Cancer Father     Allergies  Allergen Reactions  . Percocet [Oxycodone-Acetaminophen] Itching and Other (See Comments)    Extreme agitation    Medication list has been reviewed and updated.  Current Outpatient Prescriptions on File Prior to Visit  Medication Sig Dispense Refill  .  elvitegravir-cobicistat-emtricitabine-tenofovir (STRIBILD) 150-150-200-300 MG TABS tablet Take 1 tablet by mouth daily with breakfast. 30 tablet 11  . ofloxacin (OCUFLOX) 0.3 % ophthalmic solution Place 1 drop into the left eye every 4 (four) hours. (Patient not taking: Reported on 03/14/2014) 5 mL 0   No current facility-administered medications on file prior to visit.    Review of Systems:  As per HPI- otherwise negative.   Physical Examination: Filed Vitals:   03/14/14 1558  BP: 94/70  Pulse: 150  Temp: 100.2 F (37.9 C)  Resp: 36   There were no vitals filed for this visit. There is no weight on file to calculate BMI. Ideal Body Weight:    GEN: WDWN, NAD, Non-toxic, A & O x 3, appears ill.     HEENT: Atraumatic, Normocephalic. Neck supple. No masses, No LAD.  Bilateral TM wnl, oropharynx normal.  PEERL,EOMI.   Ears and Nose: No external deformity. CV: RRR, No M/G/R. No JVD. No thrill. No extra heart sounds. PULM: CTA B, no wheezes, crackles, rhonchi. No retractions. No resp. distress. No accessory muscle use. ABD: S, NT, ND. No rebound. No HSM. EXTR: No c/c/e NEURO Normal gait.  PSYCH: Normally interactive. Conversant. Not depressed or anxious appearing.  Calm demeanor.   [facility] reading (PRIMARY) by  Dr. Patsy Lageropland. CXR:  negative   Results for orders placed or performed in visit on 03/14/14  POCT CBC  Result Value Ref Range   WBC 15.7 (A) 4.6 - 10.2 K/uL   Lymph, poc 1.1 0.6 - 3.4   POC LYMPH PERCENT 7.1 (A) 10 - 50 %L   MID (cbc) 0.5 0 - 0.9   POC MID % 2.9 0 - 12 %M   POC Granulocyte 14.1 (A) 2 - 6.9   Granulocyte percent 90.0 (A) 37 - 80 %G   RBC 5.68 4.69 - 6.13 M/uL   Hemoglobin 16.7 14.1 - 18.1 g/dL   HCT, POC 16.151.7 09.643.5 - 53.7 %   MCV 91.0 80 - 97 fL   MCH, POC 29.4 27 - 31.2 pg   MCHC 32.3 31.8 - 35.4 g/dL   RDW, POC 04.513.2 %   Platelet Count, POC 313 142 - 424 K/uL   MPV 7.1 0 - 99.8 fL   Placed 18 gauge in left AC and started NS for hydration.  Started  2nd bag at 5:12 pm  Assessment and Plan: Vomiting without nausea, vomiting of unspecified type - Plan: POCT CBC  Fever and chills - Plan: DG Chest 2 View  Tachycardia   HIV positive pt who has not been taking his medications as of late.  Here today with illness- fever, vomiting, tachycardia and leukocytosis.  He may be septic.  Will transport to the ED via EMS for further evaluation and treatment   Signed Abbe AmsterdamJessica Caidynce Muzyka, MD

## 2014-03-14 NOTE — ED Provider Notes (Signed)
CSN: [id number]     Arrival date & time 03/14/14  1748 History   First MD Initiated Contact with Patient 03/14/14 1911     Chief Complaint  Patient presents with  . Emesis  . Diarrhea  . Fever     (Consider location/radiation/quality/duration/timing/severity/associated sxs/prior Treatment) HPI Comments: Patient with history of HIV, noncompliant with medications -- presents with two-day history of fatigue, fever, non-bloody watery diarrhea approximately every 30 minutes, vomiting (x 1 day). No appreciable abdominal pain. No URI sx, chest pain, SOB, cough, urinary symptoms, skin rash. No recent travel. No recent antibiotics. Pt notes he had steak that was undercooked the day prior, however no other contacts are ill. Patient was helping care for a friend who had pneumonia. No treatments PTA. Was seen by PCP prior and sent to hospital for further evaluation. The onset of this condition was acute. The course is constant. Aggravating factors: none. Alleviating factors: none. Last CD4 count 7 mo ago was 240.    Patient is a 48 y.o. male presenting with vomiting, diarrhea, and fever. The history is provided by the patient.  Emesis Associated symptoms: diarrhea and headaches (just recently, mild. )   Associated symptoms: no abdominal pain, no myalgias and no sore throat   Diarrhea Associated symptoms: fever, headaches (just recently, mild. ) and vomiting   Associated symptoms: no abdominal pain and no myalgias   Fever Associated symptoms: diarrhea, headaches (just recently, mild. ), nausea and vomiting   Associated symptoms: no chest pain, no cough, no dysuria, no myalgias, no rash, no rhinorrhea and no sore throat     Past Medical History  Diagnosis Date  . HIV infection   . RLQ abdominal pain   . Chronic kidney disease    Past Surgical History  Procedure Laterality Date  . Hernia repair  2009    Open primary repair Dr. Lindie SpruceWyatt  . Hernia repair  02/2010    Lap V[facility]  repair with mesh  .  Tonsillectomy     Family History  Problem Relation Age of Onset  . Cancer Father    History  Substance Use Topics  . Smoking status: Never Smoker   . Smokeless tobacco: Never Used  . Alcohol Use: No    Review of Systems  Constitutional: Positive for fever, appetite change and fatigue.  HENT: Negative for rhinorrhea and sore throat.   Eyes: Negative for redness and visual disturbance.  Respiratory: Negative for cough and shortness of breath.   Cardiovascular: Negative for chest pain.  Gastrointestinal: Positive for nausea, vomiting and diarrhea. Negative for abdominal pain.  Genitourinary: Negative for dysuria.  Musculoskeletal: Negative for myalgias, back pain and neck pain.  Skin: Negative for rash.  Neurological: Positive for headaches (just recently, mild. ).   Allergies  Percocet  Home Medications   Prior to Admission medications   Medication Sig Start Date End Date Taking? Authorizing Provider  elvitegravir-cobicistat-emtricitabine-tenofovir (STRIBILD) 150-150-200-300 MG TABS tablet Take 1 tablet by mouth daily with breakfast. Patient taking differently: Take 1 tablet by mouth daily.  06/20/13  Yes Judyann Munsonynthia Snider, MD  Phenyleph-CPM-DM-APAP (TYLENOL COLD HEAD CONGESTION PO) Take 2 tablets by mouth every 6 (six) hours as needed (aches, pain).   Yes Historical Provider, MD  ofloxacin (OCUFLOX) 0.3 % ophthalmic solution Place 1 drop into the left eye every 4 (four) hours. Patient not taking: Reported on 03/14/2014 01/07/14   Jonita Albeehris W Guest, MD   BP 117/77 mmHg  Pulse 128  Temp(Src) 99.2 F (37.3 C) (Oral)  Resp 19  SpO2 93%   Physical Exam  Constitutional: He appears well-developed and well-nourished.  HENT:  Head: Normocephalic and atraumatic.  Dry mucous membranes.  Eyes: Conjunctivae are normal. Pupils are equal, round, and reactive to light. Right eye exhibits no discharge. Left eye exhibits no discharge.  Neck: Normal range of motion. Neck supple. No JVD  present.  No meningeal signs. Full ROM without any difficulty.   Cardiovascular: Regular rhythm and normal heart sounds.  Tachycardia present.   No murmur heard. Pulmonary/Chest: Effort normal and breath sounds normal. No respiratory distress. He has no wheezes. He has no rales.  Abdominal: Soft. Bowel sounds are normal. There is no tenderness. There is no rebound and no guarding.  Musculoskeletal: Normal range of motion. He exhibits no edema or tenderness.  Neurological: He is alert.  Skin: Skin is warm and dry.  Psychiatric: He has a normal mood and affect.  Nursing note and vitals reviewed.   ED Course  Procedures (including critical care time) Labs Review Labs Reviewed  CBC WITH DIFFERENTIAL - Abnormal; Notable for the following:    WBC 13.1 (*)    Neutrophils Relative % 84 (*)    Neutro Abs 11.0 (*)    Lymphocytes Relative 8 (*)    All other components within normal limits  COMPREHENSIVE METABOLIC PANEL - Abnormal; Notable for the following:    Sodium 136 (*)    Glucose, Bld 119 (*)    GFR calc non Af Amer 79 (*)    Anion gap 16 (*)    All other components within normal limits  URINALYSIS, ROUTINE W REFLEX MICROSCOPIC - Abnormal; Notable for the following:    APPearance CLOUDY (*)    All other components within normal limits  I-STAT CG4 LACTIC ACID, ED    Imaging Review Dg Chest 2 View  03/14/2014   CLINICAL DATA:  Shortness of breath and dizziness  EXAM: CHEST  2 VIEW  COMPARISON:  April 19, 2012  FINDINGS: Lungs are clear. Heart size and pulmonary vascularity are normal. No adenopathy. No bone lesions.  IMPRESSION: No edema or consolidation.   Electronically Signed   By: Bretta BangWilliam  Woodruff M.D.   On: 03/14/2014 17:17     EKG Interpretation None       7:57 PM Patient seen and examined. Work-up initiated. Fluids ordered. He appears non-toxic. Lactate 1.74.   Vital signs reviewed and are as follows: BP 117/77 mmHg  Pulse 128  Temp(Src) 99.2 F (37.3 C) (Oral)   Resp 19  SpO2 93%  11:29 PM Patient discussed with and seen by Dr. Romeo AppleHarrison previously. Patient is persistently tachycardic after 3L NS. Will admit.   Spoke with Dr. Konrad DoloresMerrell who will see.   BP 114/53 mmHg  Pulse 134  Temp(Src) 100.3 F (37.9 C) (Oral)  Resp 20  SpO2 94%  MDM   Final diagnoses:  Fever  Fever, unspecified fever cause  Nausea vomiting and diarrhea  HIV (human immunodeficiency virus infection)   Admit. Persistent tachycardia in setting of N/V/D. Patient does have mild HA that started upon arrival to ED but do not suspect CNS etiology at this time.     Renne CriglerJoshua Rindi Beechy, PA-C 03/14/14 2332  Purvis SheffieldForrest Harrison, MD 03/15/14 1154

## 2014-03-14 NOTE — ED Notes (Signed)
Pt. Temperature 103. RN, Hardie LoraLilibeth made aware.

## 2014-03-14 NOTE — ED Notes (Signed)
Bed: NF62WA25 Expected date:  Expected time:  Means of arrival:  Comments: EMS-N/V/D-fever

## 2014-03-14 NOTE — ED Notes (Signed)
Per EMS, Pt from [facility] urgent care c/o N/V/D, fever, chills and body aches x 3 days. Pt has hx of HIV. Pt is not compliant with medications. Urgent care thinks pt is septic. Pt received 4mg  Zofran per EMS. A&Ox4. Pt did not have flu shot.

## 2014-03-14 NOTE — H&P (Signed)
[facility] History and Physical  Eric SereneJames T Souter Jr. [id number] DOB: 12/19/1965 DOA: 03/14/2014  Referring physician: PA Josh Clemencia CourseGeiple - [facility] PCP: Dois DavenportICHTER,Eric L., MD   Chief Complaint: emesis and diarrhea  HPI: Eric SereneJames T Eric Jr. is a 48 y.o. male  2 days of fevers, fatigue w/ non-bloody watery diarrhea (Q6130min) and 1 day of nonbloody non-bilious emesis. Denies abd pain. No recent ABX. Sick contacts w/ pneumonia and bacteremia. Symptoms are getting worse and has had no relief since onset.  Undercooked steak 3 days ago.  Only taking Stribil at most 3 times a week for several months   Review of Systems:  Constitutional:  Per hpi HEENT:  No headaches, Difficulty swallowing,Tooth/dental problems,Sore throat,  No sneezing, itching, ear ache, nasal congestion, post nasal drip,  Cardio-vascular:  No chest pain, Orthopnea, PND, swelling in lower extremities, anasarca, dizziness, palpitations  GI:  Per HPI Resp:  No shortness of breath with exertion or at rest. No excess mucus, no productive cough, No non-productive cough, No coughing up of blood.No change in color of mucus.No wheezing.No chest wall deformity  Skin:  no rash or lesions.  GU:  no dysuria, change in color of urine, no urgency or frequency. No flank pain.  Musculoskeletal:  No joint pain or swelling. No decreased range of motion. No back pain.  Psych:  No change in mood or affect. No depression or anxiety. No memory loss.   Past Medical History  Diagnosis Date  . HIV infection   . RLQ abdominal pain    Past Surgical History  Procedure Laterality Date  . Hernia repair  2009    Open primary repair Dr. Lindie SpruceWyatt  . Hernia repair  02/2010    Lap VWH  repair with mesh  . Tonsillectomy     Social History:  reports that he has never smoked. He has never used smokeless tobacco. He reports that he drinks alcohol. He reports that he does not use illicit drugs.  Allergies  Allergen Reactions  . Percocet  [Oxycodone-Acetaminophen] Itching and Other (See Comments)    Extreme agitation    Family History  Problem Relation Age of Onset  . Cancer Father      Prior to Admission medications   Medication Sig Start Date End Date Taking? Authorizing Provider  elvitegravir-cobicistat-emtricitabine-tenofovir (STRIBILD) 150-150-200-300 MG TABS tablet Take 1 tablet by mouth daily with breakfast. Patient taking differently: Take 1 tablet by mouth daily.  06/20/13  Yes Eric Munsonynthia Snider, MD  Phenyleph-CPM-DM-APAP (TYLENOL COLD HEAD CONGESTION PO) Take 2 tablets by mouth every 6 (six) hours as needed (aches, pain).   Yes Historical Provider, MD  ofloxacin (OCUFLOX) 0.3 % ophthalmic solution Place 1 drop into the left eye every 4 (four) hours. Patient not taking: Reported on 03/14/2014 01/07/14   Eric Albeehris W Guest, MD   Physical Exam: Filed Vitals:   03/14/14 2200 03/14/14 2300 03/14/14 2324 03/14/14 2330  BP: 143/90 114/71 114/53 114/61  Pulse: 56 132 134 129  Temp:   100.3 F (37.9 C)   TempSrc:   Oral   Resp:   20   SpO2: 84% 96% 94% 94%    Wt Readings from Last 3 Encounters:  01/07/14 103.874 kg (229 lb)  11/29/13 102.513 kg (226 lb)  08/12/13 104.418 kg (230 lb 3.2 oz)    General:  Appears calm and comfortable, diaphoretic Eyes:  PERRL, normal lids, irises & conjunctiva ENT: Dry mm Neck:  no LAD, masses or thyromegaly Cardiovascular:  RRR, no m/r/g. No LE  edema. Telemetry:  SR, no arrhythmias  Respiratory:  CTA bilaterally, no w/r/r. Normal respiratory effort. Abdomen:  soft, ntnd Skin:  no rash or induration seen on limited exam Musculoskeletal:  grossly normal tone BUE/BLE Psychiatric:  grossly normal mood and affect, speech fluent and appropriate Neurologic:  grossly non-focal.          Labs on Admission:  Basic Metabolic Panel:  Recent Labs Lab 03/14/14 1858  NA 136*  K 4.2  CL 99  CO2 21  GLUCOSE 119*  BUN 12  CREATININE 1.08  CALCIUM 8.9   Liver Function  Tests:  Recent Labs Lab 03/14/14 1858  AST 16  ALT 13  ALKPHOS 39  BILITOT 0.4  PROT 7.4  ALBUMIN 3.5   No results for input(s): LIPASE, AMYLASE in the last 168 hours. No results for input(s): AMMONIA in the last 168 hours. CBC:  Recent Labs Lab 03/14/14 1633 03/14/14 1858  WBC 15.7* 13.1*  NEUTROABS  --  11.0*  HGB 16.7 14.9  HCT 51.7 46.3  MCV 91.0 92.4  PLT  --  296   Cardiac Enzymes: No results for input(s): CKTOTAL, CKMB, CKMBINDEX, TROPONINI in the last 168 hours.  BNP (last 3 results) No results for input(s): PROBNP in the last 8760 hours. CBG: No results for input(s): GLUCAP in the last 168 hours.  Radiological Exams on Admission: Dg Chest 2 View  03/14/2014   CLINICAL DATA:  Shortness of breath and dizziness  EXAM: CHEST  2 VIEW  COMPARISON:  April 19, 2012  FINDINGS: Lungs are clear. Heart size and pulmonary vascularity are normal. No adenopathy. No bone lesions.  IMPRESSION: No edema or consolidation.   Electronically Signed   By: Bretta BangWilliam  Woodruff M.D.   On: 03/14/2014 17:17    EKG: pending  Assessment/Plan Active Problems:   Human immunodeficiency virus (HIV) disease   Nausea vomiting and diarrhea   SIRS (systemic inflammatory response syndrome)  SIRS: Lactic Acid 1.74, WBC 13.1, Febrile to 103 and Tachycardic to 120s after 3L NS, Tachypneaic. PT is a non-compliant HIV patient w/ recent sick exposures and eating of undercooked meet. CXR nml. Suspect viral etiology of gut infeciton but cannot r/o bacterial given HIV status and non-compliance. Recent sick contacts w/ bacterial sepsis/CAP and raw meet ingestion.   - Admit to step down - BCX - NS bolus 1L - NS 14750ml/hr - Stool Lactoferrin - stool culture - Stool Ova and parasite - Cdiff - probiotic - Vanc Zos - taper therapy as appropriate - zofran  HIV: non-compliant . Last CD4 240 in April.  - Stribild daily - CD4 count - CD4/CD8 - HIV viral load   Code Status: FULL DVT Prophylaxis:  Hep Family Communication: None Disposition Plan: pending improvement   Chapman, Eric J [facility] [email or web address] Password [email or web address]

## 2014-03-15 ENCOUNTER — Inpatient Hospital Stay (HOSPITAL_COMMUNITY): Payer: BC Managed Care – PPO

## 2014-03-15 ENCOUNTER — Encounter (HOSPITAL_COMMUNITY): Payer: Self-pay | Admitting: Family Medicine

## 2014-03-15 DIAGNOSIS — R509 Fever, unspecified: Secondary | ICD-10-CM | POA: Insufficient documentation

## 2014-03-15 DIAGNOSIS — B20 Human immunodeficiency virus [HIV] disease: Secondary | ICD-10-CM | POA: Insufficient documentation

## 2014-03-15 DIAGNOSIS — R100 Acute abdomen: Secondary | ICD-10-CM

## 2014-03-15 DIAGNOSIS — R6510 Systemic inflammatory response syndrome (SIRS) of non-infectious origin without acute organ dysfunction: Secondary | ICD-10-CM

## 2014-03-15 DIAGNOSIS — R109 Unspecified abdominal pain: Secondary | ICD-10-CM | POA: Insufficient documentation

## 2014-03-15 DIAGNOSIS — A419 Sepsis, unspecified organism: Secondary | ICD-10-CM | POA: Diagnosis present

## 2014-03-15 LAB — CBC
HCT: 41.1 % (ref 39.0–52.0)
HEMOGLOBIN: 13.1 g/dL (ref 13.0–17.0)
MCH: 29.2 pg (ref 26.0–34.0)
MCHC: 31.9 g/dL (ref 30.0–36.0)
MCV: 91.7 fL (ref 78.0–100.0)
Platelets: 272 10*3/uL (ref 150–400)
RBC: 4.48 MIL/uL (ref 4.22–5.81)
RDW: 12.5 % (ref 11.5–15.5)
WBC: 7.2 10*3/uL (ref 4.0–10.5)

## 2014-03-15 LAB — COMPREHENSIVE METABOLIC PANEL
ALBUMIN: 3.1 g/dL — AB (ref 3.5–5.2)
ALT: 14 U/L (ref 0–53)
ANION GAP: 14 (ref 5–15)
AST: 14 U/L (ref 0–37)
Alkaline Phosphatase: 31 U/L — ABNORMAL LOW (ref 39–117)
BUN: 12 mg/dL (ref 6–23)
CALCIUM: 8.2 mg/dL — AB (ref 8.4–10.5)
CO2: 20 mEq/L (ref 19–32)
CREATININE: 1.01 mg/dL (ref 0.50–1.35)
Chloride: 102 mEq/L (ref 96–112)
GFR calc non Af Amer: 86 mL/min — ABNORMAL LOW (ref >90)
GLUCOSE: 122 mg/dL — AB (ref 70–99)
Potassium: 3.8 mEq/L (ref 3.7–5.3)
Sodium: 136 mEq/L — ABNORMAL LOW (ref 137–147)
TOTAL PROTEIN: 6.4 g/dL (ref 6.0–8.3)
Total Bilirubin: 0.5 mg/dL (ref 0.3–1.2)

## 2014-03-15 LAB — FECAL LACTOFERRIN, QUANT: FECAL LACTOFERRIN: POSITIVE

## 2014-03-15 LAB — RAPID URINE DRUG SCREEN, HOSP PERFORMED
AMPHETAMINES: NOT DETECTED
Barbiturates: NOT DETECTED
Benzodiazepines: NOT DETECTED
Cocaine: NOT DETECTED
Opiates: NOT DETECTED
Tetrahydrocannabinol: NOT DETECTED

## 2014-03-15 LAB — TROPONIN I

## 2014-03-15 LAB — INFLUENZA PANEL BY PCR (TYPE A & B)
H1N1 flu by pcr: NOT DETECTED
INFLAPCR: NEGATIVE
Influenza B By PCR: NEGATIVE

## 2014-03-15 LAB — MRSA PCR SCREENING: MRSA by PCR: NEGATIVE

## 2014-03-15 LAB — PROTIME-INR
INR: 1.26 (ref 0.00–1.49)
PROTHROMBIN TIME: 15.9 s — AB (ref 11.6–15.2)

## 2014-03-15 LAB — APTT: APTT: 34 s (ref 24–37)

## 2014-03-15 LAB — FIBRINOGEN: Fibrinogen: 480 mg/dL — ABNORMAL HIGH (ref 204–475)

## 2014-03-15 LAB — LIPASE, BLOOD: LIPASE: 11 U/L (ref 11–59)

## 2014-03-15 LAB — OCCULT BLOOD GASTRIC / DUODENUM (SPECIMEN CUP)
Occult Blood, Gastric: POSITIVE — AB
pH, Gastric: 2

## 2014-03-15 LAB — CLOSTRIDIUM DIFFICILE BY PCR: CDIFFPCR: NEGATIVE

## 2014-03-15 MED ORDER — PIPERACILLIN-TAZOBACTAM 3.375 G IVPB 30 MIN: 3.3750 g | Freq: Once | INTRAVENOUS | Status: DC

## 2014-03-15 MED ORDER — ELVITEG-COBIC-EMTRICIT-TENOFDF 150-150-200-300 MG PO TABS
1.0000 | ORAL_TABLET | Freq: Every day | ORAL | Status: DC
  Administered 2014-03-16 – 2014-03-18 (×3): 1 via ORAL
  Filled 2014-03-15 (×5): qty 1

## 2014-03-15 MED ORDER — VANCOMYCIN HCL IN DEXTROSE 750-5 MG/150ML-% IV SOLN
750.0000 mg | Freq: Two times a day (BID) | INTRAVENOUS | Status: DC
  Administered 2014-03-15 – 2014-03-16 (×2): 750 mg via INTRAVENOUS
  Filled 2014-03-15 (×2): qty 150

## 2014-03-15 MED ORDER — HYDROCODONE-ACETAMINOPHEN 5-325 MG PO TABS: 1.0000 | ORAL_TABLET | ORAL | Status: DC | PRN

## 2014-03-15 MED ORDER — IOHEXOL 300 MG/ML  SOLN
100.0000 mL | Freq: Once | INTRAMUSCULAR | Status: AC | PRN
  Administered 2014-03-15: 100 mL via INTRAVENOUS

## 2014-03-15 MED ORDER — IOHEXOL 300 MG/ML  SOLN
50.0000 mL | Freq: Once | INTRAMUSCULAR | Status: AC | PRN
  Administered 2014-03-15: 50 mL via ORAL

## 2014-03-15 MED ORDER — HEPARIN SODIUM (PORCINE) 5000 UNIT/ML IJ SOLN
5000.0000 [IU] | Freq: Three times a day (TID) | INTRAMUSCULAR | Status: DC
  Administered 2014-03-15 – 2014-03-18 (×9): 5000 [IU] via SUBCUTANEOUS
  Filled 2014-03-15 (×10): qty 1

## 2014-03-15 MED ORDER — BACID PO TABS
2.0000 | ORAL_TABLET | Freq: Three times a day (TID) | ORAL | Status: DC
  Filled 2014-03-15 (×3): qty 2

## 2014-03-15 MED ORDER — VANCOMYCIN HCL 10 G IV SOLR
2000.0000 mg | Freq: Once | INTRAVENOUS | Status: AC
  Administered 2014-03-15: 2000 mg via INTRAVENOUS
  Filled 2014-03-15: qty 2000

## 2014-03-15 MED ORDER — VANCOMYCIN HCL IN DEXTROSE 1-5 GM/200ML-% IV SOLN: 1000.0000 mg | Freq: Once | INTRAVENOUS | Status: DC

## 2014-03-15 MED ORDER — ONDANSETRON HCL 4 MG/2ML IJ SOLN
4.0000 mg | Freq: Four times a day (QID) | INTRAMUSCULAR | Status: DC | PRN
  Administered 2014-03-15 (×2): 4 mg via INTRAVENOUS
  Filled 2014-03-15 (×2): qty 2

## 2014-03-15 MED ORDER — SODIUM CHLORIDE 0.9 % IJ SOLN
3.0000 mL | Freq: Two times a day (BID) | INTRAMUSCULAR | Status: DC
  Administered 2014-03-16: 3 mL via INTRAVENOUS

## 2014-03-15 MED ORDER — LACTINEX PO CHEW
2.0000 | CHEWABLE_TABLET | Freq: Three times a day (TID) | ORAL | Status: DC
  Filled 2014-03-15 (×4): qty 2

## 2014-03-15 MED ORDER — ONDANSETRON HCL 4 MG/2ML IJ SOLN: 4.0000 mg | Freq: Three times a day (TID) | INTRAMUSCULAR | Status: DC | PRN

## 2014-03-15 MED ORDER — ACETAMINOPHEN 10 MG/ML IV SOLN
1000.0000 mg | Freq: Once | INTRAVENOUS | Status: AC
  Administered 2014-03-15: 1000 mg via INTRAVENOUS
  Filled 2014-03-15: qty 100

## 2014-03-15 MED ORDER — BACID PO TABS
2.0000 | ORAL_TABLET | Freq: Three times a day (TID) | ORAL | Status: DC
  Filled 2014-03-15 (×4): qty 2

## 2014-03-15 MED ORDER — SODIUM CHLORIDE 0.9 % IV SOLN: INTRAVENOUS | Status: DC

## 2014-03-15 MED ORDER — SODIUM CHLORIDE 0.9 % IV SOLN
INTRAVENOUS | Status: DC
  Administered 2014-03-15: 04:00:00 via INTRAVENOUS
  Administered 2014-03-15 (×2): 1000 mL via INTRAVENOUS
  Administered 2014-03-16 (×2): via INTRAVENOUS

## 2014-03-15 MED ORDER — ONDANSETRON HCL 4 MG PO TABS: 4.0000 mg | ORAL_TABLET | Freq: Four times a day (QID) | ORAL | Status: DC | PRN

## 2014-03-15 MED ORDER — LACTINEX PO CHEW
1.0000 | CHEWABLE_TABLET | Freq: Three times a day (TID) | ORAL | Status: DC
  Filled 2014-03-15 (×4): qty 1

## 2014-03-15 MED ORDER — PIPERACILLIN-TAZOBACTAM 3.375 G IVPB
3.3750 g | Freq: Three times a day (TID) | INTRAVENOUS | Status: DC
  Administered 2014-03-15 – 2014-03-17 (×9): 3.375 g via INTRAVENOUS
  Filled 2014-03-15 (×9): qty 50

## 2014-03-15 NOTE — Progress Notes (Signed)
PROGRESS NOTE  Eric SereneJames T Deyo Jr. [id number] DOB: 03/11/1966 DOA: 03/14/2014 PCP: Dois DavenportICHTER,KAREN L., MD  Brief history 48 year old male with a history of HIV presented with 2 day history of diarrhea and one-day history of vomiting with associated fever. At the time of admission, the patient was noted to have a temperature of 103.68F with tachycardia up into the 130s. The patient relates a history of recent ingestion of a rare steak approximately 2 days prior to admission. He also endorsed some sick contacts with recent family that has had bacteremia and pneumonia. The patient denies any recent antibiotic use. He denied any hematemesis, hematochezia, melena, chest pain, shortness breath, coughing. He has been poorly compliant with his antiretroviral medications. Assessment/Plan: SIRS with vomiting and diarrhea -03/15/2014 KUB--shows dilated small bowel loops up to 5 cm - 03/15/2014 NG tube inserted -Remain nothing by mouth -CT abdomen and pelvis -Await stool culture, stool lactoferrin, see PCR, stool O and P -Unfortunately, blood cultures were not obtained prior to starting antibiotics -Influenza PCR Intractable vomiting with nausea -Improved after NG tube insertion -CT abdomen and pelvis -Check lipase -LFTs normal -Urinalysis without any pyuria -Check urine drug screen -Continue probiotics  -continue IVF Gastroccult positive  -May be related to NG tube trauma  -Monitor hemoglobin  HIV -last CD4 count 240 on 07/23/13 -Repeat CD4 count -Continue StriBild   Family Communication:   Pt at beside Disposition Plan:   Home when medically stable   Antibiotics:  Vancomycin 03/15/14>>>  Zosyn 03/15/14>>>    Procedures/Studies: Dg Chest 2 View  03/14/2014   CLINICAL DATA:  Shortness of breath and dizziness  EXAM: CHEST  2 VIEW  COMPARISON:  April 19, 2012  FINDINGS: Lungs are clear. Heart size and pulmonary vascularity are normal. No adenopathy. No bone lesions.   IMPRESSION: No edema or consolidation.   Electronically Signed   By: Bretta BangWilliam  Woodruff M.D.   On: 03/14/2014 17:17   Dg Abd 1 View  03/15/2014   CLINICAL DATA:  Vomiting, diarrhea, fatigue and fever for several days. Initial encounter.  EXAM: ABDOMEN - 1 VIEW  COMPARISON:  Abdominal radiograph performed 01/02/2006  FINDINGS: There is dilatation of small bowel loops to 5.0 cm in maximal diameter, raising concern for small bowel obstruction. Trace residual air is noted within the colon. No free intra-abdominal air is identified, though evaluation for free air is limited on a single supine view.  No acute osseous abnormalities are seen.  IMPRESSION: Dilatation of small bowel loops to 5.0 cm maximal diameter, raising concern for small bowel obstruction. No free intra-abdominal air seen.  These results were called by telephone at the time of interpretation on 03/15/2014 at 5:41 am to [facility], who verbally acknowledged these results.   Electronically Signed   By: Roanna RaiderJeffery  Chang M.D.   On: 03/15/2014 05:42         Subjective:  patient states that abdominal pain and vomiting have improved after NG tube insertion. Denies any chest pain, shortness breath, hemoptysis, hematochezia, melena. No dysuria or hematuria. Denies any headaches or visual disturbance. No neck pain.   Objective: Filed Vitals:   03/15/14 0200 03/15/14 0300 03/15/14 0400 03/15/14 0500  BP: 113/76 134/91 137/81 140/81  Pulse: 92 90 94 98  Temp:   98.5 F (36.9 C)   TempSrc:   Oral   Resp: 20 12 19 22   Height:      Weight:  SpO2: 93% 97% 100% 96%    Intake/Output Summary (Last 24 hours) at 03/15/14 0804 Last data filed at 03/15/14 0500  Gross per 24 hour  Intake 3815.42 ml  Output   1150 ml  Net 2665.42 ml   Weight change:  Exam:   General:  Pt is alert, follows commands appropriately, not in acute distress  HEENT: No icterus, No thrush, No  meningismus, Golden/AT  Cardiovascular:  RRR, S1/S2, no rubs, no gallops  Respiratory: CTA bilaterally, no wheezing, no crackles, no rhonchi  Abdomen: Soft/+BS, mild LUQ tender without rebound, non distended, no guarding  Extremities: No edema, No lymphangitis, No petechiae, No rashes, no synovitis  Data Reviewed: Basic Metabolic Panel:  Recent Labs Lab 03/14/14 1858 03/15/14 0150  NA 136* 136*  K 4.2 3.8  CL 99 102  CO2 21 20  GLUCOSE 119* 122*  BUN 12 12  CREATININE 1.08 1.01  CALCIUM 8.9 8.2*   Liver Function Tests:  Recent Labs Lab 03/14/14 1858 03/15/14 0150  AST 16 14  ALT 13 14  ALKPHOS 39 31*  BILITOT 0.4 0.5  PROT 7.4 6.4  ALBUMIN 3.5 3.1*   No results for input(s): LIPASE, AMYLASE in the last 168 hours. No results for input(s): AMMONIA in the last 168 hours. CBC:  Recent Labs Lab 03/14/14 1633 03/14/14 1858 03/15/14 0150  WBC 15.7* 13.1* 7.2  NEUTROABS  --  11.0*  --   HGB 16.7 14.9 13.1  HCT 51.7 46.3 41.1  MCV 91.0 92.4 91.7  PLT  --  296 272   Cardiac Enzymes:  Recent Labs Lab 03/15/14 0150  TROPONINI <0.30   BNP: Invalid input(s): POCBNP CBG: No results for input(s): GLUCAP in the last 168 hours.  Recent Results (from the past 240 hour(s))  MRSA PCR Screening     Status: None   Collection Time: 03/15/14 12:18 AM  Result Value Ref Range Status   MRSA by PCR NEGATIVE NEGATIVE Final    Comment:        The GeneXpert MRSA Assay (FDA approved for NASAL specimens only), is one component of a comprehensive MRSA colonization surveillance program. It is not intended to diagnose MRSA infection nor to guide or monitor treatment for MRSA infections.      Scheduled Meds: . elvitegravir-cobicistat-emtricitabine-tenofovir  1 tablet Oral Q breakfast  . heparin  5,000 Units Subcutaneous 3 times per day  . lactobacillus acidophilus & bulgar  2 tablet Oral TID WC  . lactobacillus acidophilus  2 tablet Oral TID  . piperacillin-tazobactam (ZOSYN)  IV  3.375 g Intravenous Q8H    . sodium chloride  3 mL Intravenous Q12H  . vancomycin  750 mg Intravenous Q12H   Continuous Infusions: . sodium chloride 125 mL/hr (03/14/14 2357)  . sodium chloride 150 mL/hr at 03/15/14 0423     Tamzin Bertling, DO  [facility] Pager [phone]  If 7PM-7AM, please contact night-coverage [email or web address] Password [email or web address] 03/15/2014, 8:04 AM   LOS: 1 day

## 2014-03-15 NOTE — Progress Notes (Signed)
ANTIBIOTIC CONSULT NOTE - INITIAL  Pharmacy Consult for Vancomycin & Zosyn Indication: Sepsis  Allergies  Allergen Reactions  . Percocet [Oxycodone-Acetaminophen] Itching and Other (See Comments)    Extreme agitation    Patient Measurements: Height: 5\' 11"  (180.3 cm) Weight: 227 lb 8.2 oz (103.2 kg) IBW/kg (Calculated) : 75.3  Vital Signs: Temp: 98.5 F (36.9 C) (12/19 0045) Temp Source: Oral (12/19 0045) BP: 104/70 mmHg (12/19 0045) Pulse Rate: 105 (12/19 0045) Intake/Output from previous day: 12/18 0701 - 12/19 0700 In: 3000 [I.V.:3000] Out: -  Intake/Output from this shift: Total I/O In: 3000 [I.V.:3000] Out: -   Labs:  Recent Labs  03/14/14 1633 03/14/14 1858  WBC 15.7* 13.1*  HGB 16.7 14.9  PLT  --  296  CREATININE  --  1.08   Estimated Creatinine Clearance: 102.3 mL/min (by C-G formula based on Cr of 1.08). No results for input(s): VANCOTROUGH, VANCOPEAK, VANCORANDOM, GENTTROUGH, GENTPEAK, GENTRANDOM, TOBRATROUGH, TOBRAPEAK, TOBRARND, AMIKACINPEAK, AMIKACINTROU, AMIKACIN in the last 72 hours.   Microbiology: No results found for this or any previous visit (from the past 720 hour(s)).  Medical History: Past Medical History  Diagnosis Date  . HIV infection   . RLQ abdominal pain     Medications:  Scheduled:  . elvitegravir-cobicistat-emtricitabine-tenofovir  1 tablet Oral Q breakfast  . heparin  5,000 Units Subcutaneous 3 times per day  . lactobacillus acidophilus  2 tablet Oral TID  . piperacillin-tazobactam (ZOSYN)  IV  3.375 g Intravenous Q8H  . sodium chloride  3 mL Intravenous Q12H  . vancomycin  2,000 mg Intravenous Once  . vancomycin  750 mg Intravenous Q12H   Infusions:  . sodium chloride 125 mL/hr (03/14/14 2357)  . sodium chloride     Assessment:  [age] yr male with c/o vomiting and diarrhea, fever, fatigue  H/O non-compliant HIV and recently around sick individuals  Antibiotics to begin for r/o sepsis  Cultures ordered  Goal  of Therapy:  Vancomycin trough level 15-20 mcg/ml  Plan:  Measure antibiotic drug levels at steady state Follow up culture results   Vancomycin 2gm IV x 1 then 750mg  IV q12h  Zosyn 3.375gm IV q8h (each dose infused over 4 hrs)  Maitri Schnoebelen, Joselyn GlassmanLeann Trefz, PharmD 03/15/2014,1:01 AM

## 2014-03-15 NOTE — Progress Notes (Addendum)
CT abdomen and pelvis--Mild small bowel wall thickening of the ileum with associated mesenteric inflammatory change. This leads to mild dilation of the proximal small bowel. Findings are most consistent with an infectious or inflammatory ileitis with a secondary adynamic ileus.  -C. difficile PCR is negative -Check stool pathogen panel -Await stool culture and O&P -Stool for AFB -May need GI evaluation if no clinical improvement -Clamp NG today -Allow for ice chips -Discontinue probiotics -No further vomiting since and she was placed to suction -CMV culture in stool -CMV DNA in blood  DTat

## 2014-03-16 LAB — CBC
HCT: 38.1 % — ABNORMAL LOW (ref 39.0–52.0)
Hemoglobin: 12.6 g/dL — ABNORMAL LOW (ref 13.0–17.0)
MCH: 29.7 pg (ref 26.0–34.0)
MCHC: 33.1 g/dL (ref 30.0–36.0)
MCV: 89.9 fL (ref 78.0–100.0)
Platelets: 242 10*3/uL (ref 150–400)
RBC: 4.24 MIL/uL (ref 4.22–5.81)
RDW: 12.6 % (ref 11.5–15.5)
WBC: 8.7 10*3/uL (ref 4.0–10.5)

## 2014-03-16 LAB — BASIC METABOLIC PANEL
Anion gap: 13 (ref 5–15)
BUN: 8 mg/dL (ref 6–23)
CALCIUM: 8.9 mg/dL (ref 8.4–10.5)
CO2: 22 meq/L (ref 19–32)
Chloride: 102 mEq/L (ref 96–112)
Creatinine, Ser: 0.79 mg/dL (ref 0.50–1.35)
GFR calc Af Amer: >90 mL/min (ref >90)
GFR calc non Af Amer: >90 mL/min (ref >90)
GLUCOSE: 107 mg/dL — AB (ref 70–99)
Potassium: 3.7 mEq/L (ref 3.7–5.3)
Sodium: 137 mEq/L (ref 137–147)

## 2014-03-16 LAB — URINE CULTURE
Colony Count: NO GROWTH
Culture: NO GROWTH

## 2014-03-16 MED ORDER — POTASSIUM CHLORIDE IN NACL 20-0.9 MEQ/L-% IV SOLN
INTRAVENOUS | Status: DC
  Administered 2014-03-17 – 2014-03-18 (×4): via INTRAVENOUS
  Filled 2014-03-16 (×7): qty 1000

## 2014-03-16 MED ORDER — SODIUM CHLORIDE 0.9 % IV SOLN
INTRAVENOUS | Status: DC
  Administered 2014-03-16: 10:00:00 via INTRAVENOUS
  Filled 2014-03-16 (×2): qty 1000

## 2014-03-16 NOTE — Progress Notes (Signed)
Utilization Review Completed.Eric Chapman T12/20/2015  

## 2014-03-16 NOTE — Progress Notes (Addendum)
PROGRESS NOTE  Eric Chapman. ZOX:[id number] DOB: 1965-04-27 DOA: 03/14/2014 PCP: Dois Davenport., MD   Brief history 48 year old male with a history of HIV presented with 2 day history of diarrhea and one-day history of vomiting with associated fever. At the time of admission, the patient was noted to have a temperature of 103.53F with tachycardia up into the 130s. The patient relates a history of recent ingestion of a rare steak approximately 2 days prior to admission. He also endorsed some sick contacts with recent family that has had bacteremia and pneumonia. The patient denies any recent antibiotic use. He denied any hematemesis, hematochezia, melena, chest pain, shortness breath, coughing. He has been poorly compliant with his antiretroviral medications. Assessment/Plan: SIRS with vomiting and diarrhea/Enteritis -secondary to enteritis/ileitis -03/15/2014 KUB--shows dilated small bowel loops up to 5 cm - 03/15/2014 NG tube inserted -03/16/14--discontinued after clamping x 24hrs without vomiting -CT abdomen and pelvis-mild ileum thickening with vascular prominence consistent with infection/inflammation -Await stool culture, stool O and P -stool CMV culture and stool AFB culture -Unfortunately, blood cultures were not obtained prior to starting antibiotics -Influenza PCR--neg -Stool Lactoferrin--positive -Cdiff PCR--neg -d/c IV vancomycin, continue zosyn for now -continue IV fluids Intractable vomiting with nausea -Improved after NG tube insertion -03/16/14--discontinued after clamping x 24hrs without vomiting -CT abdomen and pelvis--noted above -Check lipase--11 -LFTs normal -Urinalysis without any pyuria -Check urine drug screen--neg -continue IVF -start clear liquids Gastroccult positive  -May be related to NG tube trauma  -Monitor hemoglobin--stable HIV -last CD4 count 240 on 07/23/13 -Repeat CD4 count -Continue StriBild Family Communication: Pt at  beside Disposition Plan: Transfer to floow   Antibiotics:  Vancomycin 03/15/14>>>03/16/14  Zosyn 03/15/14>>>   Procedures/Studies: Dg Chest 2 View  03/14/2014   CLINICAL DATA:  Shortness of breath and dizziness  EXAM: CHEST  2 VIEW  COMPARISON:  April 19, 2012  FINDINGS: Lungs are clear. Heart size and pulmonary vascularity are normal. No adenopathy. No bone lesions.  IMPRESSION: No edema or consolidation.   Electronically Signed   By: Bretta Bang M.D.   On: 03/14/2014 17:17   Dg Abd 1 View  03/15/2014   CLINICAL DATA:  Vomiting, diarrhea, fatigue and fever for several days. Initial encounter.  EXAM: ABDOMEN - 1 VIEW  COMPARISON:  Abdominal radiograph performed 01/02/2006  FINDINGS: There is dilatation of small bowel loops to 5.0 cm in maximal diameter, raising concern for small bowel obstruction. Trace residual air is noted within the colon. No free intra-abdominal air is identified, though evaluation for free air is limited on a single supine view.  No acute osseous abnormalities are seen.  IMPRESSION: Dilatation of small bowel loops to 5.0 cm maximal diameter, raising concern for small bowel obstruction. No free intra-abdominal air seen.  These results were called by telephone at the time of interpretation on 03/15/2014 at 5:41 am to Nursing at the [facility], who verbally acknowledged these results.   Electronically Signed   By: Roanna Raider M.D.   On: 03/15/2014 05:42   Ct Abdomen Pelvis W Contrast  03/15/2014   CLINICAL DATA:  Intractable nausea vomiting for 1 day. Diarrhea for 2 days. Fever, but normal white blood cell count. Occult GI bleeding. Positive HIV.  EXAM: CT ABDOMEN AND PELVIS WITH CONTRAST  TECHNIQUE: Multidetector CT imaging of the abdomen and pelvis was performed using the standard protocol following bolus administration of intravenous contrast.  CONTRAST:  50mL OMNIPAQUE IOHEXOL 300  MG/ML SOLN, 100mL OMNIPAQUE IOHEXOL 300 MG/ML SOLN  COMPARISON:   Current abdominal radiograph showing evidence of a small bowel obstruction or adynamic ileus. Prior CT, 12/27/2005.  FINDINGS: There is mild wall thickening of the ileum with associated vascular prominence along the small bowel mesentery as well as some hazy central small bowel mesentery opacity. Multiple sub cm mesenteric lymph nodes are noted. The more proximal small bowel is mildly dilated to a maximum of 3.7 cm. There is no specific transition point. Findings are most consistent with small bowel infection or inflammation with an associated mild adynamic ileus.  Fluid is seen throughout a nondilated colon. No colonic wall thickening or adjacent inflammation is seen. Normal appendix visualized.  Mild subsegmental atelectasis noted at the lung bases. Lung bases otherwise clear. Heart is normal in size.  Possible mild fatty infiltration of the liver. No liver mass or focal lesion.  Spleen, gallbladder, pancreas, adrenal glands, kidneys, ureters, bladder: Normal.  No pathologically enlarged lymph nodes. No evidence of an abscess. No free air.  Mild disc bulging and loss of disc height at L5-S1.  No bone lesion.  NG tube well positioned in the distal stomach.  IMPRESSION: 1. Mild small bowel wall thickening of the ileum with associated mesenteric inflammatory change. This leads to mild dilation of the proximal small bowel. Findings are most consistent with an infectious or inflammatory ileitis with a secondary adynamic ileus. No convincing obstruction. 2. No other acute findings.  No abscess or free air.   Electronically Signed   By: Amie Portlandavid  Ormond M.D.   On: 03/15/2014 10:09         Subjective: Patient states the vomiting has improved. He continues to have loose stools without any hematochezia or melena. Denies any abdominal pain, chest pain, shortness breath, coughing, hemoptysis, fevers, chills, dysuria, hematuria.  Objective: Filed Vitals:   03/16/14 0400 03/16/14 0500 03/16/14 0600 03/16/14 0800  BP:  142/97  132/107 121/94  Pulse: 61 68 90 75  Temp: 98.3 F (36.8 C)   98.3 F (36.8 C)  TempSrc: Oral   Oral  Resp: 18 18 21 21   Height:      Weight: 102.331 kg (225 lb 9.6 oz)     SpO2: 94% 94% 95% 95%    Intake/Output Summary (Last 24 hours) at 03/16/14 0939 Last data filed at 03/16/14 0843  Gross per 24 hour  Intake   3899 ml  Output    975 ml  Net   2924 ml   Weight change: -0.869 kg (-1 lb 14.6 oz) Exam:   General:  Pt is alert, follows commands appropriately, not in acute distress  HEENT: No icterus, No thrush,[place]/AT  Cardiovascular: RRR, S1/S2, no rubs, no gallops  Respiratory: CTA bilaterally, no wheezing, no crackles, no rhonchi  Abdomen: Soft/+BS, non tender, non distended, no guarding  Extremities: No edema, No lymphangitis, No petechiae, No rashes, no synovitis  Data Reviewed: Basic Metabolic Panel:  Recent Labs Lab 03/14/14 1858 03/15/14 0150 03/16/14 0343  NA 136* 136* 137  K 4.2 3.8 3.7  CL 99 102 102  CO2 21 20 22   GLUCOSE 119* 122* 107*  BUN 12 12 8   CREATININE 1.08 1.01 0.79  CALCIUM 8.9 8.2* 8.9   Liver Function Tests:  Recent Labs Lab 03/14/14 1858 03/15/14 0150  AST 16 14  ALT 13 14  ALKPHOS 39 31*  BILITOT 0.4 0.5  PROT 7.4 6.4  ALBUMIN 3.5 3.1*    Recent Labs Lab 03/15/14 0150  LIPASE  11   No results for input(s): AMMONIA in the last 168 hours. CBC:  Recent Labs Lab 03/14/14 1633 03/14/14 1858 03/15/14 0150 03/16/14 0343  WBC 15.7* 13.1* 7.2 8.7  NEUTROABS  --  11.0*  --   --   HGB 16.7 14.9 13.1 12.6*  HCT 51.7 46.3 41.1 38.1*  MCV 91.0 92.4 91.7 89.9  PLT  --  296 272 242   Cardiac Enzymes:  Recent Labs Lab 03/15/14 0150  TROPONINI <0.30   BNP: Invalid input(s): POCBNP CBG: No results for input(s): GLUCAP in the last 168 hours.  Recent Results (from the past 240 hour(s))  MRSA PCR Screening     Status: None   Collection Time: 03/15/14 12:18 AM  Result Value Ref Range Status   MRSA by PCR  NEGATIVE NEGATIVE Final    Comment:        The GeneXpert MRSA Assay (FDA approved for NASAL specimens only), is one component of a comprehensive MRSA colonization surveillance program. It is not intended to diagnose MRSA infection nor to guide or monitor treatment for MRSA infections.   Clostridium Difficile by PCR     Status: None   Collection Time: 03/15/14  5:31 AM  Result Value Ref Range Status   C difficile by pcr NEGATIVE NEGATIVE Final    Comment: Performed at [facility] [place]     Scheduled Meds: . elvitegravir-cobicistat-emtricitabine-tenofovir  1 tablet Oral Q breakfast  . heparin  5,000 Units Subcutaneous 3 times per day  . piperacillin-tazobactam (ZOSYN)  IV  3.375 g Intravenous Q8H  . sodium chloride  3 mL Intravenous Q12H   Continuous Infusions: . sodium chloride 0.9 % 1,000 mL with potassium chloride 20 mEq infusion       Alynna Hargrove, DO  [facility] Pager [phone]  If 7PM-7AM, please contact night-coverage [email or web address] Password [email or web address] 03/16/2014, 9:39 AM   LOS: 2 days

## 2014-03-17 DIAGNOSIS — B20 Human immunodeficiency virus [HIV] disease: Secondary | ICD-10-CM

## 2014-03-17 LAB — CBC
HCT: 39.5 % (ref 39.0–52.0)
HEMOGLOBIN: 13.1 g/dL (ref 13.0–17.0)
MCH: 29.7 pg (ref 26.0–34.0)
MCHC: 33.2 g/dL (ref 30.0–36.0)
MCV: 89.6 fL (ref 78.0–100.0)
Platelets: 307 10*3/uL (ref 150–400)
RBC: 4.41 MIL/uL (ref 4.22–5.81)
RDW: 12.6 % (ref 11.5–15.5)
WBC: 8.4 10*3/uL (ref 4.0–10.5)

## 2014-03-17 LAB — CD4/CD8 (T-HELPER/T-SUPPRESSOR CELL)
CD4 ABSOLUTE: 190 /uL — AB (ref 500–1900)
CD4%: 25 % — AB (ref 30.0–60.0)
CD8 T CELL ABS: 350 /uL (ref 230–1000)
CD8tox: 47 % — ABNORMAL HIGH (ref 15.0–40.0)
Ratio: 0.53 — ABNORMAL LOW (ref 1.0–3.0)
Total lymphocyte count: 740 /uL — ABNORMAL LOW (ref 1000–4000)

## 2014-03-17 LAB — BASIC METABOLIC PANEL
Anion gap: 13 (ref 5–15)
BUN: 4 mg/dL — AB (ref 6–23)
CHLORIDE: 102 meq/L (ref 96–112)
CO2: 25 mEq/L (ref 19–32)
CREATININE: 0.85 mg/dL (ref 0.50–1.35)
Calcium: 9.3 mg/dL (ref 8.4–10.5)
GFR calc Af Amer: >90 mL/min (ref >90)
Glucose, Bld: 99 mg/dL (ref 70–99)
Potassium: 3.7 mEq/L (ref 3.7–5.3)
Sodium: 140 mEq/L (ref 137–147)

## 2014-03-17 LAB — MAGNESIUM: Magnesium: 1.8 mg/dL (ref 1.5–2.5)

## 2014-03-17 MED ORDER — CIPROFLOXACIN HCL 500 MG PO TABS
500.0000 mg | ORAL_TABLET | Freq: Two times a day (BID) | ORAL | Status: DC
  Administered 2014-03-17 – 2014-03-18 (×2): 500 mg via ORAL
  Filled 2014-03-17 (×3): qty 1

## 2014-03-17 MED ORDER — METRONIDAZOLE 500 MG PO TABS
500.0000 mg | ORAL_TABLET | Freq: Three times a day (TID) | ORAL | Status: DC
  Administered 2014-03-17 – 2014-03-18 (×3): 500 mg via ORAL
  Filled 2014-03-17 (×4): qty 1

## 2014-03-17 MED ORDER — ZOLPIDEM TARTRATE 5 MG PO TABS
5.0000 mg | ORAL_TABLET | Freq: Once | ORAL | Status: AC
  Administered 2014-03-17: 5 mg via ORAL
  Filled 2014-03-17: qty 1

## 2014-03-17 NOTE — Clinical Documentation Improvement (Signed)
Please specify diagnosis related to below supporting information.  Possible Clinical Conditions?  Septicemia / Sepsis secondary enteritis/ileitis  SIRS/ Sepsis secondary enteritis/ileitis  Septic Shock Sepsis of unknown etiology / source Other Condition  Cannot clinically Determine   Supporting Information:   Per  03/16/14 MD progress notes = SIRS with vomiting and diarrhea/Enteritis  -secondary to enteritis/ileitis.   At the time of admission, the patient was noted to have a temperature of 103.64F with tachycardia up into the 130s. The patient relates a history of recent ingestion of a rare steak approximately 2 days prior to admission. He also endorsed some sick contacts with recent family that has had bacteremia and pneumonia.  Await stool culture, stool O and P  -stool CMV culture and stool AFB culture  -Unfortunately, blood cultures were not obtained prior to starting antibiotics  -Influenza PCR--neg  -Stool Lactoferrin--positive  -Cdiff PCR--neg  -d/c IV vancomycin, continue zosyn for now  -continue IV fluids   Temp Range  =  103.1 - 100.3.    (03/14/14)    Thank You, Shelda Palarlene H Shanisha Lech ,RN Clinical Documentation Specialist:  [phone]  [facility]- Health Information Management

## 2014-03-17 NOTE — Progress Notes (Signed)
PROGRESS NOTE  Eric SereneJames T Medlin Jr. JYN:[id number] DOB: 06/26/1965 DOA: 03/14/2014 PCP: Dois DavenportICHTER,KAREN L., MD  Brief history 48 year old male with a history of HIV presented with 2 day history of diarrhea and one-day history of vomiting with associated fever. At the time of admission, the patient was noted to have a temperature of 103.67F with tachycardia up into the 130s. The patient relates a history of recent ingestion of a rare steak approximately 2 days prior to admission. He also endorsed some sick contacts with recent family that has had bacteremia and pneumonia. The patient denies any recent antibiotic use. He denied any hematemesis, hematochezia, melena, chest pain, shortness breath, coughing. He has been poorly compliant with his antiretroviral medications. Assessment/Plan: Sepsis with vomiting and diarrhea/Enteritis -present at time of admission -secondary to enteritis/ileitis -03/15/2014 KUB--shows dilated small bowel loops up to 5 cm - 03/15/2014 NG tube inserted -03/16/14--discontinued after clamping x 24hrs without vomiting -03/16/14--full liquids -03/17/14--soft diet -CT abdomen and pelvis-mild ileum thickening with vascular prominence consistent with infection/inflammation -Await stool culture, stool O and P -stool CMV culture and stool AFB culture--pending -Unfortunately, blood cultures were not obtained prior to starting antibiotics -Influenza PCR--neg -Stool Lactoferrin--positive -Cdiff PCR--neg -d/c IV vancomycin -12/21--Discontinue IV Zosyn, start po Cipro and Flagyl--plan 7 days total of antibiotics -continue IV fluids Intractable vomiting with nausea -Improved after NG tube insertion -03/16/14--discontinued after clamping x 24hrs without vomiting -CT abdomen and pelvis--noted above -Check lipase--11 -LFTs normal -Urinalysis without any pyuria -Check urine drug screen--neg -continue IVF -Resolved, diet has been gradually advanced to a soft diet which  the patient is tolerating Gastroccult positive  -May be related to NG tube trauma  -Monitor hemoglobin--stable HIV -last CD4 count 240 on 07/23/13 -Repeat CD4 count--190-->will need PCP prophylaxis -Continue StriBild Family Communication: Pt at beside Disposition Plan: Transfer to floow   Antibiotics:  Vancomycin 03/15/14>>>03/16/14  Zosyn 03/15/14>>>   Procedures/Studies: Dg Chest 2 View  03/14/2014   CLINICAL DATA:  Shortness of breath and dizziness  EXAM: CHEST  2 VIEW  COMPARISON:  April 19, 2012  FINDINGS: Lungs are clear. Heart size and pulmonary vascularity are normal. No adenopathy. No bone lesions.  IMPRESSION: No edema or consolidation.   Electronically Signed   By: Bretta BangWilliam  Woodruff M.D.   On: 03/14/2014 17:17   Dg Abd 1 View  03/15/2014   CLINICAL DATA:  Vomiting, diarrhea, fatigue and fever for several days. Initial encounter.  EXAM: ABDOMEN - 1 VIEW  COMPARISON:  Abdominal radiograph performed 01/02/2006  FINDINGS: There is dilatation of small bowel loops to 5.0 cm in maximal diameter, raising concern for small bowel obstruction. Trace residual air is noted within the colon. No free intra-abdominal air is identified, though evaluation for free air is limited on a single supine view.  No acute osseous abnormalities are seen.  IMPRESSION: Dilatation of small bowel loops to 5.0 cm maximal diameter, raising concern for small bowel obstruction. No free intra-abdominal air seen.  These results were called by telephone at the time of interpretation on 03/15/2014 at 5:41 am to [facility], who verbally acknowledged these results.   Electronically Signed   By: Roanna RaiderJeffery  Chang M.D.   On: 03/15/2014 05:42   Ct Abdomen Pelvis W Contrast  03/15/2014   CLINICAL DATA:  Intractable nausea vomiting for 1 day. Diarrhea for 2 days. Fever, but normal white blood cell count. Occult GI bleeding. Positive HIV.  EXAM: CT ABDOMEN AND  PELVIS WITH CONTRAST   TECHNIQUE: Multidetector CT imaging of the abdomen and pelvis was performed using the standard protocol following bolus administration of intravenous contrast.  CONTRAST:  50mL OMNIPAQUE IOHEXOL 300 MG/ML SOLN, OMNIPAQUE IOHEXOL 300 MG/ML SOLN  COMPARISON:  Current abdominal radiograph showing evidence of a small bowel obstruction or adynamic ileus. Prior CT, 12/27/2005.  FINDINGS: There is mild wall thickening of the ileum with associated vascular prominence along the small bowel mesentery as well as some hazy central small bowel mesentery opacity. Multiple sub cm mesenteric lymph nodes are noted. The more proximal small bowel is mildly dilated to a maximum of 3.7 cm. There is no specific transition point. Findings are most consistent with small bowel infection or inflammation with an associated mild adynamic ileus.  Fluid is seen throughout a nondilated colon. No colonic wall thickening or adjacent inflammation is seen. Normal appendix visualized.  Mild subsegmental atelectasis noted at the lung bases. Lung bases otherwise clear. Heart is normal in size.  Possible mild fatty infiltration of the liver. No liver mass or focal lesion.  Spleen, gallbladder, pancreas, adrenal glands, kidneys, ureters, bladder: Normal.  No pathologically enlarged lymph nodes. No evidence of an abscess. No free air.  Mild disc bulging and loss of disc height at L5-S1.  No bone lesion.  NG tube well positioned in the distal stomach.  IMPRESSION: 1. Mild small bowel wall thickening of the ileum with associated mesenteric inflammatory change. This leads to mild dilation of the proximal small bowel. Findings are most consistent with an infectious or inflammatory ileitis with a secondary adynamic ileus. No convincing obstruction. 2. No other acute findings.  No abscess or free air.   Electronically Signed   By: Amie Portland M.D.   On: 03/15/2014 10:09         Subjective: Patient is feeling better. No further vomiting. He is  tolerating his diet. He states that his stools have slowed down. Denies any chest pain, shortness breath, vomiting, diarrhea, abdominal pain, dysuria, hematuria.  Objective: Filed Vitals:   03/16/14 1910 03/16/14 2150 03/17/14 0433 03/17/14 1420  BP: 125/78 113/66 129/83 116/76  Pulse: 89 82 80 73  Temp: 98.5 F (36.9 C) 98.4 F (36.9 C) 98 F (36.7 C) 97.9 F (36.6 C)  TempSrc: Oral Oral Oral Oral  Resp: 20 20 20 20   Height:      Weight:      SpO2: 99% 98% 97% 98%    Intake/Output Summary (Last 24 hours) at 03/17/14 1912 Last data filed at 03/17/14 1423  Gross per 24 hour  Intake   1690 ml  Output      0 ml  Net   1690 ml   Weight change:  Exam:   General:  Pt is alert, follows commands appropriately, not in acute distress  HEENT: No icterus, No thrush,  [place]/AT  Cardiovascular: RRR, S1/S2, no rubs, no gallops  Respiratory: CTA bilaterally, no wheezing, no crackles, no rhonchi  Abdomen: Soft/+BS, non tender, non distended, no guarding  Extremities: No edema, No lymphangitis, No petechiae, No rashes, no synovitis  Data Reviewed: Basic Metabolic Panel:  Recent Labs Lab 03/14/14 1858 03/15/14 0150 03/16/14 0343 03/17/14 0506  NA 136* 136* 137 140  K 4.2 3.8 3.7 3.7  CL 99 102 102 102  CO2 21 20 22 25   GLUCOSE 119* 122* 107* 99  BUN 12 12 8  4*  CREATININE 1.08 1.01 0.79 0.85  CALCIUM 8.9 8.2* 8.9 9.3  MG  --   --   --  1.8   Liver Function Tests:  Recent Labs Lab 03/14/14 1858 03/15/14 0150  AST 16 14  ALT 13 14  ALKPHOS 39 31*  BILITOT 0.4 0.5  PROT 7.4 6.4  ALBUMIN 3.5 3.1*    Recent Labs Lab 03/15/14 0150  LIPASE 11   No results for input(s): AMMONIA in the last 168 hours. CBC:  Recent Labs Lab 03/14/14 1633 03/14/14 1858 03/15/14 0150 03/16/14 0343 03/17/14 0506  WBC 15.7* 13.1* 7.2 8.7 8.4  NEUTROABS  --  11.0*  --   --   --   HGB 16.7 14.9 13.1 12.6* 13.1  HCT 51.7 46.3 41.1 38.1* 39.5  MCV 91.0 92.4 91.7 89.9 89.6  PLT   --  296 272 242 307   Cardiac Enzymes:  Recent Labs Lab 03/15/14 0150  TROPONINI <0.30   BNP: Invalid input(s): POCBNP CBG: No results for input(s): GLUCAP in the last 168 hours.  Recent Results (from the past 240 hour(s))  Urine culture     Status: None   Collection Time: 03/14/14  9:32 PM  Result Value Ref Range Status   Specimen Description URINE, CLEAN CATCH  Final   Special Requests NONE  Final   Culture  Setup Time   Final    03/15/2014 16:29 Performed at [facility]    Colony Count NO GROWTH Performed at [facility]   Final   Culture NO GROWTH Performed at [facility]   Final   Report Status 03/16/2014 FINAL  Final  MRSA PCR Screening     Status: None   Collection Time: 03/15/14 12:18 AM  Result Value Ref Range Status   MRSA by PCR NEGATIVE NEGATIVE Final    Comment:        The GeneXpert MRSA Assay (FDA approved for NASAL specimens only), is one component of a comprehensive MRSA colonization surveillance program. It is not intended to diagnose MRSA infection nor to guide or monitor treatment for MRSA infections.   Stool culture     Status: None (Preliminary result)   Collection Time: 03/15/14  5:31 AM  Result Value Ref Range Status   Specimen Description STOOL  Final   Special Requests NONE  Final   Culture   Final    NO SUSPICIOUS COLONIES, CONTINUING TO HOLD Performed at [facility]    Report Status PENDING  Incomplete  Clostridium Difficile by PCR     Status: None   Collection Time: 03/15/14  5:31 AM  Result Value Ref Range Status   C difficile by pcr NEGATIVE NEGATIVE Final    Comment: Performed at [facility]  AFB culture with smear     Status: None (Preliminary result)   Collection Time: 03/15/14  1:28 PM  Result Value Ref Range Status   Specimen Description STOOL  Final   Special Requests NONE  Final   Acid Fast Smear   Final    NO ACID FAST BACILLI SEEN Performed at [facility]     Culture   Final    CULTURE WILL BE EXAMINED FOR 6 WEEKS BEFORE ISSUING A FINAL REPORT Performed at [facility]    Report Status PENDING  Incomplete     Scheduled Meds: . ciprofloxacin  500 mg Oral BID  . elvitegravir-cobicistat-emtricitabine-tenofovir  1 tablet Oral Q breakfast  . heparin  5,000 Units Subcutaneous 3 times per day  . metroNIDAZOLE  500 mg Oral 3 times per day  . sodium chloride  3 mL  Intravenous Q12H   Continuous Infusions: . 0.9 % NaCl with KCl 20 mEq / L 125 mL/hr at 03/17/14 1742     Hasaan Radde, DO  [facility] Pager [phone]  If 7PM-7AM, please contact night-coverage [email or web address] Password [email or web address] 03/17/2014, 7:12 PM   LOS: 3 days

## 2014-03-18 LAB — GI PATHOGEN PANEL BY PCR, STOOL
C difficile toxin A/B: NEGATIVE
CAMPYLOBACTER BY PCR: NEGATIVE
Cryptosporidium by PCR: NEGATIVE
E COLI (STEC): NEGATIVE
E coli (ETEC) LT/ST: NEGATIVE
E coli 0157 by PCR: NEGATIVE
G LAMBLIA BY PCR: NEGATIVE
Norovirus GI/GII: NEGATIVE
Rotavirus A by PCR: NEGATIVE
SALMONELLA BY PCR: NEGATIVE
Shigella by PCR: NEGATIVE

## 2014-03-18 LAB — BASIC METABOLIC PANEL
ANION GAP: 8 (ref 5–15)
CALCIUM: 8.7 mg/dL (ref 8.4–10.5)
CO2: 25 mmol/L (ref 19–32)
CREATININE: 0.76 mg/dL (ref 0.50–1.35)
Chloride: 104 mEq/L (ref 96–112)
GFR calc Af Amer: >90 mL/min (ref >90)
GFR calc non Af Amer: >90 mL/min (ref >90)
Glucose, Bld: 90 mg/dL (ref 70–99)
Potassium: 3.6 mmol/L (ref 3.5–5.1)
Sodium: 137 mmol/L (ref 135–145)

## 2014-03-18 LAB — HIV-1 RNA QUANT-NO REFLEX-BLD
HIV 1 RNA Quant: 30 copies/mL — ABNORMAL HIGH (ref <20)
HIV-1 RNA Quant, Log: 1.48 {Log} — ABNORMAL HIGH (ref <1.30)

## 2014-03-18 LAB — STOOL CULTURE

## 2014-03-18 LAB — OVA AND PARASITE EXAMINATION

## 2014-03-18 MED ORDER — METRONIDAZOLE 500 MG PO TABS
500.0000 mg | ORAL_TABLET | Freq: Three times a day (TID) | ORAL | Status: DC
Start: 2014-03-18 — End: 2014-05-20

## 2014-03-18 MED ORDER — SULFAMETHOXAZOLE-TRIMETHOPRIM 800-160 MG PO TABS: 1.0000 | ORAL_TABLET | Freq: Every day | ORAL | Status: DC

## 2014-03-18 MED ORDER — TRAZODONE HCL 50 MG PO TABS: 50.0000 mg | ORAL_TABLET | Freq: Every day | ORAL | Status: DC

## 2014-03-18 MED ORDER — CIPROFLOXACIN HCL 500 MG PO TABS: 500.0000 mg | ORAL_TABLET | Freq: Two times a day (BID) | ORAL | Status: DC

## 2014-03-18 NOTE — Discharge Summary (Signed)
Physician Discharge Summary  Eric SereneJames T Chappuis Jr. RUE:[id number] DOB: 08/05/1965 DOA: 03/14/2014  PCP: Dois DavenportICHTER,KAREN L., MD  Admit date: 03/14/2014 Discharge date: 03/18/2014  Recommendations for Outpatient Follow-up:  1. Pt will need to follow up with PCP in 2 weeks post discharge 2. Please obtain BMP and CBC in one week   Discharge Diagnoses:  Sepsis with vomiting and diarrhea/Enteritis -present at time of admission -secondary to enteritis/ileitis -03/15/2014 KUB--shows dilated small bowel loops up to 5 cm - 03/15/2014 NG tube inserted -03/16/14--discontinued after clamping x 24hrs without vomiting -03/16/14--full liquids -03/17/14--soft diet -CT abdomen and pelvis-mild ileum thickening with vascular prominence consistent with infection/inflammation -Await stool culture, stool O and P -stool CMV culture and stool AFB culture--pending -Stool AFB stain negative -Unfortunately, blood cultures were not obtained prior to starting antibiotics -Influenza PCR--neg -Stool Lactoferrin--positive -Cdiff PCR--neg -d/c IV vancomycin -12/21--Discontinue IV Zosyn, start po Cipro and Flagyl--plan 7 days total of antibiotics Intractable vomiting with nausea -Improved after NG tube insertion -03/16/14--discontinued after clamping x 24hrs without vomiting -CT abdomen and pelvis--noted above -Check lipase--11 -LFTs normal -Urinalysis without any pyuria -Check urine drug screen--neg -continue IVF during the hospitalization -Resolved, diet has been gradually advanced to a soft diet which the patient is tolerating Gastroccult positive  -May be related to NG tube trauma  -Monitor hemoglobin--stable HIV -last CD4 count 240 on 07/23/13 -Repeat CD4 count--190-->will need PCP prophylaxis -Continue StriBild Insomnia -pt requested a sleep agent which may also help him with anxiety -start trazadone 50mg  at bedtime Discharge Condition: stable  Disposition: home  Diet:soft Wt Readings from  Last 3 Encounters:  03/16/14 102.331 kg (225 lb 9.6 oz)  01/07/14 103.874 kg (229 lb)  11/29/13 102.513 kg (226 lb)    History of present illness:  48 year old male with a history of HIV presented with 2 day history of diarrhea and one-day history of vomiting with associated fever. At the time of admission, the patient was noted to have a temperature of 103.79F with tachycardia up into the 130s. The patient relates a history of recent ingestion of a rare steak approximately 2 days prior to admission. He also endorsed some sick contacts with recent family that has had bacteremia and pneumonia. The patient denies any recent antibiotic use. He denied any hematemesis, hematochezia, melena, chest pain, shortness breath, coughing. He has been poorly compliant with his antiretroviral medications.  The patient was admitted to the hospital and started on intravenous fluids and intravenous fluid hydration. Initially, the patient had intractable vomiting and an NG tube had to be inserted. After NG tube was inserted to suction, his vomiting improved. CT of the abdomen and pelvis was performed and revealed bowel wall thickening along the ileum with subcentimeter mesenteric lymph nodes. After his vomiting resolved, his NG tube was discontinued and his diet was advanced. The patient clinically improved and his diet was advanced to soft diet which he tolerated. Urine cultures were negative. Routine stool cultures were negative. Stool for AFB and CMV were sent and are pending at the time of discharge. Initial AFB stain of the stool was negative. CD4 count was 190. The patient will be started on Bactrim.   Discharge Exam: Filed Vitals:   03/18/14 0647  BP: 124/82  Pulse: 72  Temp: 98.3 F (36.8 C)  Resp: 20   Filed Vitals:   03/17/14 0433 03/17/14 1420 03/17/14 2018 03/18/14 0647  BP: 129/83 116/76 119/78 124/82  Pulse: 80 73 74 72  Temp: 98 F (36.7 C) 97.9 F (36.6 C)  98.6 F (37 C) 98.3 F (36.8 C)    TempSrc: Oral Oral Oral Oral  Resp: 20 20 20 20   Height:      Weight:      SpO2: 97% 98% 98% 95%   General: A&O x 3, NAD, pleasant, cooperative Cardiovascular: RRR, no rub, no gallop, no S3 Respiratory: CTAB, no wheeze, no rhonchi Abdomen:soft, nontender, nondistended, positive bowel sounds Extremities: No edema, No lymphangitis, no petechiae  Discharge Instructions     Medication List    STOP taking these medications        ofloxacin 0.3 % ophthalmic solution  Commonly known as:  OCUFLOX      TAKE these medications        ciprofloxacin 500 MG tablet  Commonly known as:  CIPRO  Take 1 tablet (500 mg total) by mouth 2 (two) times daily.     elvitegravir-cobicistat-emtricitabine-tenofovir 150-150-200-300 MG Tabs tablet  Commonly known as:  STRIBILD  Take 1 tablet by mouth daily with breakfast.     metroNIDAZOLE 500 MG tablet  Commonly known as:  FLAGYL  Take 1 tablet (500 mg total) by mouth every 8 (eight) hours.     sulfamethoxazole-trimethoprim 800-160 MG per tablet  Commonly known as:  BACTRIM DS,SEPTRA DS  Take 1 tablet by mouth daily.     traZODone 50 MG tablet  Commonly known as:  DESYREL  Take 1 tablet (50 mg total) by mouth at bedtime.     TYLENOL COLD HEAD CONGESTION PO  Take 2 tablets by mouth every 6 (six) hours as needed (aches, pain).         The results of significant diagnostics from this hospitalization (including imaging, microbiology, ancillary and laboratory) are listed below for reference.    Significant Diagnostic Studies: Dg Chest 2 View  03/14/2014   CLINICAL DATA:  Shortness of breath and dizziness  EXAM: CHEST  2 VIEW  COMPARISON:  April 19, 2012  FINDINGS: Lungs are clear. Heart size and pulmonary vascularity are normal. No adenopathy. No bone lesions.  IMPRESSION: No edema or consolidation.   Electronically Signed   By: Bretta Bang M.D.   On: 03/14/2014 17:17   Dg Abd 1 View  03/15/2014   CLINICAL DATA:  Vomiting,  diarrhea, fatigue and fever for several days. Initial encounter.  EXAM: ABDOMEN - 1 VIEW  COMPARISON:  Abdominal radiograph performed 01/02/2006  FINDINGS: There is dilatation of small bowel loops to 5.0 cm in maximal diameter, raising concern for small bowel obstruction. Trace residual air is noted within the colon. No free intra-abdominal air is identified, though evaluation for free air is limited on a single supine view.  No acute osseous abnormalities are seen.  IMPRESSION: Dilatation of small bowel loops to 5.0 cm maximal diameter, raising concern for small bowel obstruction. No free intra-abdominal air seen.  These results were called by telephone at the time of interpretation on 03/15/2014 at 5:41 am to Nursing at the [facility], who verbally acknowledged these results.   Electronically Signed   By: Roanna Raider M.D.   On: 03/15/2014 05:42   Ct Abdomen Pelvis W Contrast  03/15/2014   CLINICAL DATA:  Intractable nausea vomiting for 1 day. Diarrhea for 2 days. Fever, but normal white blood cell count. Occult GI bleeding. Positive HIV.  EXAM: CT ABDOMEN AND PELVIS WITH CONTRAST  TECHNIQUE: Multidetector CT imaging of the abdomen and pelvis was performed using the standard protocol following bolus administration of intravenous contrast.  CONTRAST:  50mL OMNIPAQUE IOHEXOL 300 MG/ML SOLN, 100mL OMNIPAQUE IOHEXOL 300 MG/ML SOLN  COMPARISON:  Current abdominal radiograph showing evidence of a small bowel obstruction or adynamic ileus. Prior CT, 12/27/2005.  FINDINGS: There is mild wall thickening of the ileum with associated vascular prominence along the small bowel mesentery as well as some hazy central small bowel mesentery opacity. Multiple sub cm mesenteric lymph nodes are noted. The more proximal small bowel is mildly dilated to a maximum of 3.7 cm. There is no specific transition point. Findings are most consistent with small bowel infection or inflammation with an associated mild adynamic  ileus.  Fluid is seen throughout a nondilated colon. No colonic wall thickening or adjacent inflammation is seen. Normal appendix visualized.  Mild subsegmental atelectasis noted at the lung bases. Lung bases otherwise clear. Heart is normal in size.  Possible mild fatty infiltration of the liver. No liver mass or focal lesion.  Spleen, gallbladder, pancreas, adrenal glands, kidneys, ureters, bladder: Normal.  No pathologically enlarged lymph nodes. No evidence of an abscess. No free air.  Mild disc bulging and loss of disc height at L5-S1.  No bone lesion.  NG tube well positioned in the distal stomach.  IMPRESSION: 1. Mild small bowel wall thickening of the ileum with associated mesenteric inflammatory change. This leads to mild dilation of the proximal small bowel. Findings are most consistent with an infectious or inflammatory ileitis with a secondary adynamic ileus. No convincing obstruction. 2. No other acute findings.  No abscess or free air.   Electronically Signed   By: Amie Portlandavid  Ormond M.D.   On: 03/15/2014 10:09     Microbiology: Recent Results (from the past 240 hour(s))  Urine culture     Status: None   Collection Time: 03/14/14  9:32 PM  Result Value Ref Range Status   Specimen Description URINE, CLEAN CATCH  Final   Special Requests NONE  Final   Culture  Setup Time   Final    03/15/2014 16:29 Performed at [facility]    Colony Count NO GROWTH Performed at [facility]   Final   Culture NO GROWTH Performed at [facility]   Final   Report Status 03/16/2014 FINAL  Final  MRSA PCR Screening     Status: None   Collection Time: 03/15/14 12:18 AM  Result Value Ref Range Status   MRSA by PCR NEGATIVE NEGATIVE Final    Comment:        The GeneXpert MRSA Assay (FDA approved for NASAL specimens only), is one component of a comprehensive MRSA colonization surveillance program. It is not intended to diagnose MRSA infection nor to guide or monitor treatment  for MRSA infections.   Stool culture     Status: None   Collection Time: 03/15/14  5:31 AM  Result Value Ref Range Status   Specimen Description STOOL  Final   Special Requests NONE  Final   Culture   Final    NO SALMONELLA, SHIGELLA, CAMPYLOBACTER, YERSINIA, OR E.COLI 0157:[id number] ISOLATED Performed at [facility]    Report Status 03/18/2014 FINAL  Final  Clostridium Difficile by PCR     Status: None   Collection Time: 03/15/14  5:31 AM  Result Value Ref Range Status   C difficile by pcr NEGATIVE NEGATIVE Final    Comment: [facility]formed [facility]  AFB culture with smear     Status: None (Preliminary result)   Collection Time: 03/15/14  1:28 PM  Result Value Ref  Range Status   Specimen Description STOOL  Final   Special Requests NONE  Final   Acid Fast Smear   Final    NO ACID FAST BACILLI SEEN Performed at [facility]    Culture   Final    CULTURE WILL BE EXAMINED FOR 6 WEEKS BEFORE ISSUING A FINAL REPORT Performed at [facility]    Report Status PENDING  Incomplete     Labs: Basic Metabolic Panel:  Recent Labs Lab 03/14/14 1858 03/15/14 0150 03/16/14 0343 03/17/14 0506 03/18/14 0453  NA 136* 136* 137 140 137  K 4.2 3.8 3.7 3.7 3.6  CL 99 102 102 102 104  CO2 21 20 22 25 25   GLUCOSE 119* 122* 107* 99 90  BUN 12 12 8  4* <5*  CREATININE 1.08 1.01 0.79 0.85 0.76  CALCIUM 8.9 8.2* 8.9 9.3 8.7  MG  --   --   --  1.8  --    Liver Function Tests:  Recent Labs Lab 03/14/14 1858 03/15/14 0150  AST 16 14  ALT 13 14  ALKPHOS 39 31*  BILITOT 0.4 0.5  PROT 7.4 6.4  ALBUMIN 3.5 3.1*    Recent Labs Lab 03/15/14 0150  LIPASE 11   No results for input(s): AMMONIA in the last 168 hours. CBC:  Recent Labs Lab 03/14/14 1633 03/14/14 1858 03/15/14 0150 03/16/14 0343 03/17/14 0506  WBC 15.7* 13.1* 7.2 8.7 8.4  NEUTROABS  --  11.0*  --   --   --   HGB 16.7 14.9 13.1 12.6* 13.1  HCT 51.7 46.3 41.1 38.1* 39.5  MCV 91.0 92.4  91.7 89.9 89.6  PLT  --  296 272 242 307   Cardiac Enzymes:  Recent Labs Lab 03/15/14 0150  TROPONINI <0.30   BNP: Invalid input(s): POCBNP CBG: No results for input(s): GLUCAP in the last 168 hours.  Time coordinating discharge:  Greater than 30 minutes  Signed:  Makalyn Lennox, DO [facility] Pager: [phone] 03/18/2014, 12:24 PM

## 2014-03-18 NOTE — Care Management Note (Signed)
    Page 1 of 1   03/18/2014     2:02:50 PM CARE MANAGEMENT NOTE 03/18/2014  Patient:  Eric Chapman,Eric Chapman   Account Number:  [id number]  Date Initiated:  03/18/2014  Documentation initiated by:  Lanier ClamMAHABIR,Stephanne Greeley  Subjective/Objective Assessment:   48 y/o m admitted w/n/v/d.     Action/Plan:   From home.   Anticipated DC Date:  03/18/2014   Anticipated DC Plan:  HOME/SELF CARE      DC Planning Services  CM consult      Choice offered to / List presented to:             Status of service:  Completed, signed off Medicare Important Message given?   (If response is "NO", the following Medicare IM given date fields will be blank) Date Medicare IM given:   Medicare IM given by:   Date Additional Medicare IM given:   Additional Medicare IM given by:    Discharge Disposition:  HOME/SELF CARE  Per UR Regulation:  Reviewed for med. necessity/level of care/duration of stay  If discussed at Long Length of Stay Meetings, dates discussed:    Comments:  03/18/14 Lanier ClamKathy Brenly Trawick RN BSN NCM [phone] d/c home no needs or orders.

## 2014-04-04 ENCOUNTER — Telehealth: Payer: Self-pay | Admitting: *Deleted

## 2014-04-04 NOTE — Telephone Encounter (Signed)
Left patient a voice mail to see if he is interested in the [facility] clinic on 04/11/14 at 11:00 AM. Wendall MolaJacqueline Cockerham

## 2014-04-07 LAB — CMV CULTURE CMVC: Culture: NEGATIVE

## 2014-04-27 LAB — AFB CULTURE WITH SMEAR (NOT AT ARMC): ACID FAST SMEAR: NONE SEEN

## 2014-05-05 ENCOUNTER — Other Ambulatory Visit: Payer: Self-pay | Admitting: Infectious Diseases

## 2014-05-05 DIAGNOSIS — B20 Human immunodeficiency virus [HIV] disease: Secondary | ICD-10-CM

## 2014-05-20 ENCOUNTER — Telehealth: Payer: Self-pay | Admitting: *Deleted

## 2014-05-20 ENCOUNTER — Ambulatory Visit (INDEPENDENT_AMBULATORY_CARE_PROVIDER_SITE_OTHER): Payer: 59 | Admitting: Emergency Medicine

## 2014-05-20 ENCOUNTER — Other Ambulatory Visit: Payer: Self-pay | Admitting: *Deleted

## 2014-05-20 VITALS — BP 118/86 | HR 82 | Temp 97.9°F | Resp 16 | Ht 71.0 in | Wt 229.0 lb

## 2014-05-20 DIAGNOSIS — B20 Human immunodeficiency virus [HIV] disease: Secondary | ICD-10-CM

## 2014-05-20 MED ORDER — ELVITEG-COBIC-EMTRICIT-TENOFDF 150-150-200-300 MG PO TABS: 1.0000 | ORAL_TABLET | Freq: Every day | ORAL | Status: DC

## 2014-05-20 NOTE — Telephone Encounter (Signed)
Patient's insurance changed and needed PA for Atri[facility]a through [facility]. Authorized through 05/20/16 #[id number]. Patient given phone # to set up delivery [phone].

## 2014-05-20 NOTE — Progress Notes (Signed)
[facility] [address], [place] [address] [phone]  Date:  05/20/2014   Name:  Eric SereneJames T Leavelle Jr.   DOB:  04/16/1965   MRN:  [id number]  PCP:  Dois DavenportICHTER,KAREN L., MD    Chief Complaint: Referral   History of Present Illness:  Eric SereneJames T Mcmeans Jr. is a 49 y.o. very pleasant male patient who presents with the following:  History of HIV infections Changed insurance and has a need for a current referral to his regular HIV physician No interval history No improvement with over the counter medications or other home remedies.  Denies other complaint or health concern today.   Patient Active Problem List   Diagnosis Date Noted  . Sepsis 03/15/2014  . SIRS (systemic inflammatory response syndrome) 03/15/2014  . Pyrexia   . HIV (human immunodeficiency virus infection)   . Abdominal pain, acute   . Nausea vomiting and diarrhea 03/14/2014  . History of kidney stones 08/12/2011  . Abdominal pain, R sided periumbilical 05/25/2011  . Human immunodeficiency virus (HIV) disease 02/10/2006  . GASTROENTERITIS 02/10/2006  . ILEUS 02/10/2006    Past Medical History  Diagnosis Date  . HIV infection   . RLQ abdominal pain     Past Surgical History  Procedure Laterality Date  . Hernia repair  2009    Open primary repair Dr. Lindie SpruceWyatt  . Hernia repair  02/2010    Lap VWH  repair with mesh  . Tonsillectomy      History  Substance Use Topics  . Smoking status: Never Smoker   . Smokeless tobacco: Never Used  . Alcohol Use: 0.0 oz/week    0 Standard drinks or equivalent per week     Comment: social    Family History  Problem Relation Age of Onset  . Cancer Father     Allergies  Allergen Reactions  . Percocet [Oxycodone-Acetaminophen] Itching and Other (See Comments)    Extreme agitation    Medication list has been reviewed and updated.  Current Outpatient Prescriptions on File Prior to Visit  Medication Sig Dispense Refill  .  elvitegravir-cobicistat-emtricitabine-tenofovir (STRIBILD) 150-150-200-300 MG TABS tablet Take 1 tablet by mouth daily with breakfast. (Patient taking differently: Take 1 tablet by mouth daily. ) 30 tablet 11  . ciprofloxacin (CIPRO) 500 MG tablet Take 1 tablet (500 mg total) by mouth 2 (two) times daily. (Patient not taking: Reported on 05/20/2014) 8 tablet 0   No current facility-administered medications on file prior to visit.    Review of Systems:  As per HPI, otherwise negative.    Physical Examination: Filed Vitals:   05/20/14 1106  BP: 118/86  Pulse: 82  Temp: 97.9 F (36.6 C)  Resp: 16   Filed Vitals:   05/20/14 1106  Height: 5\' 11"  (1.803 m)  Weight: 229 lb (103.874 kg)   Body mass index is 31.95 kg/(m^2). Ideal Body Weight: Weight in (lb) to have BMI = 25: 178.9   GEN: WDWN, NAD, Non-toxic, Alert & Oriented x 3 HEENT: Atraumatic, Normocephalic.  Ears and Nose: No external deformity. EXTR: No clubbing/cyanosis/edema NEURO: Normal gait.  PSYCH: Normally interactive. Conversant. Not depressed or anxious appearing.  Calm demeanor.    Assessment and Plan: HIV  Refer to ID  Signed,  Phillips OdorJeffery Anderson, MD

## 2014-06-26 ENCOUNTER — Ambulatory Visit (INDEPENDENT_AMBULATORY_CARE_PROVIDER_SITE_OTHER): Payer: 59 | Admitting: Internal Medicine

## 2014-06-26 ENCOUNTER — Encounter: Payer: Self-pay | Admitting: Internal Medicine

## 2014-06-26 VITALS — BP 128/86 | HR 83 | Temp 97.8°F | Wt 239.0 lb

## 2014-06-26 DIAGNOSIS — Z79899 Other long term (current) drug therapy: Secondary | ICD-10-CM | POA: Diagnosis not present

## 2014-06-26 DIAGNOSIS — Z113 Encounter for screening for infections with a predominantly sexual mode of transmission: Secondary | ICD-10-CM | POA: Diagnosis not present

## 2014-06-26 DIAGNOSIS — B20 Human immunodeficiency virus [HIV] disease: Secondary | ICD-10-CM

## 2014-06-26 LAB — CBC WITH DIFFERENTIAL/PLATELET
BASOS ABS: 0 10*3/uL (ref 0.0–0.1)
Basophils Relative: 0 % (ref 0–1)
Eosinophils Absolute: 0.3 10*3/uL (ref 0.0–0.7)
Eosinophils Relative: 3 % (ref 0–5)
HCT: 48.3 % (ref 39.0–52.0)
Hemoglobin: 16.6 g/dL (ref 13.0–17.0)
LYMPHS PCT: 31 % (ref 12–46)
Lymphs Abs: 2.7 10*3/uL (ref 0.7–4.0)
MCH: 30 pg (ref 26.0–34.0)
MCHC: 34.4 g/dL (ref 30.0–36.0)
MCV: 87.3 fL (ref 78.0–100.0)
MONO ABS: 0.8 10*3/uL (ref 0.1–1.0)
MONOS PCT: 9 % (ref 3–12)
MPV: 9.4 fL (ref 8.6–12.4)
Neutro Abs: 5 10*3/uL (ref 1.7–7.7)
Neutrophils Relative %: 57 % (ref 43–77)
PLATELETS: 356 10*3/uL (ref 150–400)
RBC: 5.53 MIL/uL (ref 4.22–5.81)
RDW: 13.3 % (ref 11.5–15.5)
WBC: 8.7 10*3/uL (ref 4.0–10.5)

## 2014-06-26 LAB — RPR

## 2014-06-26 LAB — COMPLETE METABOLIC PANEL WITH GFR
ALT: 34 U/L (ref 0–53)
AST: 22 U/L (ref 0–37)
Albumin: 4.5 g/dL (ref 3.5–5.2)
Alkaline Phosphatase: 46 U/L (ref 39–117)
BUN: 12 mg/dL (ref 6–23)
CO2: 26 mEq/L (ref 19–32)
Calcium: 9.8 mg/dL (ref 8.4–10.5)
Chloride: 100 mEq/L (ref 96–112)
Creat: 0.94 mg/dL (ref 0.50–1.35)
GFR, Est African American: >89 mL/min
GFR, Est Non African American: >89 mL/min
Glucose, Bld: 88 mg/dL (ref 70–99)
POTASSIUM: 4.7 meq/L (ref 3.5–5.3)
SODIUM: 138 meq/L (ref 135–145)
Total Bilirubin: 0.6 mg/dL (ref 0.2–1.2)
Total Protein: 7.7 g/dL (ref 6.0–8.3)

## 2014-06-26 LAB — LIPID PANEL
CHOL/HDL RATIO: 5.9 ratio
Cholesterol: 196 mg/dL (ref 0–200)
HDL: 33 mg/dL — ABNORMAL LOW (ref >=40)
LDL Cholesterol: 132 mg/dL — ABNORMAL HIGH (ref 0–99)
Triglycerides: 153 mg/dL — ABNORMAL HIGH (ref <150)
VLDL: 31 mg/dL (ref 0–40)

## 2014-06-26 NOTE — Progress Notes (Signed)
Patient ID: Eric SereneJames T Naeve Jr., male   DOB: 12/24/1965, 49 y.o.   MRN: [id number]       Patient ID: Eric SereneJames T Duclos Jr., male   DOB: 09/16/1965, 49 y.o.   MRN: [id number]  HPI [name] is a  49yo Male with HIV disease-anxiety, Cd 4 count of 240/VL 30, on stribild. We last saw him roughly a year ago. This past Fall, he suffered significant emotional losses including the death of his father,and a close neighbor who he cared for. He was hospitalized this past December for gastroenteritis and treated with a course of cipro/metronidazole. 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 He states that he is often finding it difficult to go to sleep due to many thoughts racing in his mind. He has had relief with xanax in the past. Wonders if he needs to see psychiatrist. He has not seen psychiatrist or psychologist before  Outpatient Encounter Prescriptions as of 06/26/2014  Medication Sig  . elvitegravir-cobicistat-emtricitabine-tenofovir (STRIBILD) 150-150-200-300 MG TABS tablet Take 1 tablet by mouth daily with breakfast.  . ciprofloxacin (CIPRO) 500 MG tablet Take 1 tablet (500 mg total) by mouth 2 (two) times daily. (Patient not taking: Reported on 06/26/2014)     Patient Active Problem List   Diagnosis Date Noted  . Sepsis 03/15/2014  . SIRS (systemic inflammatory response syndrome) 03/15/2014  . Pyrexia   . HIV (human immunodeficiency virus infection)   . Abdominal pain, acute   . Nausea vomiting and diarrhea 03/14/2014  . History of kidney stones 08/12/2011  . Abdominal pain, R sided periumbilical 05/25/2011  . Human immunodeficiency virus (HIV) disease 02/10/2006  . GASTROENTERITIS 02/10/2006  . ILEUS 02/10/2006     Health Maintenance Due  Topic Date Due  . INFLUENZA VACCINE  10/26/2013     Review of Systems + anxiety Physical Exam   BP 128/86  mmHg  Pulse 83  Temp(Src) 97.8 F (36.6 C) (Oral)  Wt 239 lb (108.41 kg) No exam.  Lab Results  Component Value Date   CD4TCELL 10* 07/23/2013   Lab Results  Component Value Date   CD4TABS 240* 07/23/2013   CD4TABS 200* 06/10/2013   CD4TABS 110 12/27/2005   Lab Results  Component Value Date   HIV1RNAQUANT 30* 03/15/2014   No results found for: HEPBSAB No results found for: RPR  CBC Lab Results  Component Value Date   WBC 8.4 03/17/2014   RBC 4.41 03/17/2014   HGB 13.1 03/17/2014   HCT 39.5 03/17/2014   PLT 307 03/17/2014   MCV 89.6 03/17/2014   MCH 29.7 03/17/2014   MCHC 33.2 03/17/2014   RDW 12.6 03/17/2014   LYMPHSABS 1.0 03/14/2014   MONOABS 1.0 03/14/2014   EOSABS 0.0 03/14/2014   BASOSABS 0.0 03/14/2014   BMET Lab Results  Component Value Date   NA 137 03/18/2014   K 3.6 03/18/2014   CL 104 03/18/2014   CO2 25 03/18/2014   GLUCOSE 90 03/18/2014   BUN <5* 03/18/2014   CREATININE 0.76 03/18/2014   CALCIUM 8.7 03/18/2014   GFRNONAA >90 03/18/2014   GFRAA >90 03/18/2014     Assessment and Plan  hiv disease= will do labs today. Continue on stribild, previously well controlled  Anxiety = spoke 30 min in face to face time. He states that he is open to seeing psychiatry but slightly resistant in  seeing a Veterinary surgeon. He doesn't think it will work. During our conversation he appears to have little insight knowing that his "stress" is anxiety or that asking for xanax, for treatment of insomnia won't really address underlying anxiety.we will give him info to get Referral to psych thru [facility].getting him set up with Bernette Redbird for counseling  Bernette Redbird spent 15 min with patient introduction and explaining process of counseling  Insomnia =related to insomnia, can do trazodone  rtc in 3 months

## 2014-06-27 LAB — T-HELPER CELL (CD4) - (RCID CLINIC ONLY)
CD4 % Helper T Cell: 18 % — ABNORMAL LOW (ref 33–55)
CD4 T CELL ABS: 460 /uL (ref 400–2700)

## 2014-06-27 LAB — URINE CYTOLOGY ANCILLARY ONLY
Chlamydia: NEGATIVE
Neisseria Gonorrhea: NEGATIVE

## 2014-06-28 LAB — HIV-1 RNA QUANT-NO REFLEX-BLD
HIV 1 RNA QUANT: 25 {copies}/mL — AB (ref <20)
HIV-1 RNA QUANT, LOG: 1.4 {Log} — AB (ref <1.30)

## 2014-07-30 ENCOUNTER — Ambulatory Visit (INDEPENDENT_AMBULATORY_CARE_PROVIDER_SITE_OTHER): Payer: 59 | Admitting: Family Medicine

## 2014-07-30 VITALS — BP 120/84 | HR 86 | Temp 98.2°F | Resp 16 | Ht 71.0 in | Wt 232.0 lb

## 2014-07-30 DIAGNOSIS — E860 Dehydration: Secondary | ICD-10-CM

## 2014-07-30 LAB — COMPREHENSIVE METABOLIC PANEL
ALT: 20 U/L (ref 0–53)
AST: 16 U/L (ref 0–37)
Albumin: 4.2 g/dL (ref 3.5–5.2)
Alkaline Phosphatase: 39 U/L (ref 39–117)
BILIRUBIN TOTAL: 0.5 mg/dL (ref 0.2–1.2)
BUN: 9 mg/dL (ref 6–23)
CALCIUM: 9.1 mg/dL (ref 8.4–10.5)
CHLORIDE: 100 meq/L (ref 96–112)
CO2: 27 meq/L (ref 19–32)
Creat: 1 mg/dL (ref 0.50–1.35)
GLUCOSE: 79 mg/dL (ref 70–99)
POTASSIUM: 4.2 meq/L (ref 3.5–5.3)
Sodium: 138 mEq/L (ref 135–145)
TOTAL PROTEIN: 7.5 g/dL (ref 6.0–8.3)

## 2014-07-30 LAB — POCT CBC
Granulocyte percent: 70.2 %G (ref 37–80)
HCT, POC: 47.7 % (ref 43.5–53.7)
Hemoglobin: 15.7 g/dL (ref 14.1–18.1)
LYMPH, POC: 1.6 (ref 0.6–3.4)
MCH, POC: 29.5 pg (ref 27–31.2)
MCHC: 33 g/dL (ref 31.8–35.4)
MCV: 89.3 fL (ref 80–97)
MID (CBC): 0.6 (ref 0–0.9)
MPV: 6.8 fL (ref 0–99.8)
PLATELET COUNT, POC: 341 10*3/uL (ref 142–424)
POC Granulocyte: 5.1 (ref 2–6.9)
POC LYMPH %: 21.5 % (ref 10–50)
POC MID %: 8.3 %M (ref 0–12)
RBC: 5.34 M/uL (ref 4.69–6.13)
RDW, POC: 12.8 %
WBC: 7.3 10*3/uL (ref 4.6–10.2)

## 2014-07-30 LAB — POCT UA - MICROSCOPIC ONLY
BACTERIA, U MICROSCOPIC: NEGATIVE
Casts, Ur, LPF, POC: NEGATIVE
Crystals, Ur, HPF, POC: NEGATIVE
EPITHELIAL CELLS, URINE PER MICROSCOPY: NEGATIVE
MUCUS UA: POSITIVE
Yeast, UA: NEGATIVE

## 2014-07-30 LAB — POCT URINALYSIS DIPSTICK
Bilirubin, UA: NEGATIVE
GLUCOSE UA: NEGATIVE
KETONES UA: NEGATIVE
Leukocytes, UA: NEGATIVE
Nitrite, UA: NEGATIVE
Protein, UA: NEGATIVE
SPEC GRAV UA: 1.02
Urobilinogen, UA: 0.2
pH, UA: 5.5

## 2014-07-30 LAB — CK: Total CK: 107 U/L (ref 7–232)

## 2014-07-30 NOTE — Patient Instructions (Signed)
Rest and take it easy for a couple of days.   Try to drink fluids and take in some extra salt to help with hydration (soup, saltines, pretzels) Let me know if you are not feeling better in the next couple of days and I will let you know if there are any other lab abnormalities/

## 2014-07-30 NOTE — Progress Notes (Addendum)
[facility] [address], [place] [id number] [phone]  Date:  07/30/2014   Name:  Eric SereneJames T Dilger Jr.   DOB:  09/14/1965   MRN:  [id number]  PCP:  Dois DavenportICHTER,KAREN L., MD    Chief Complaint: Dehydration; Chills; and Generalized Body Aches   History of Present Illness:  Eric SereneJames T Pennings Jr. is a 49 y.o. very pleasant male patient who presents with the following:  History of HIV which is currently treated and under good control.  Here today with concern of dehydration.  He states that he "got overheated" a couple of times this week; most recently occurred yesterday.  He noted a sweat, felt hot and a bit dizzy.  He was working in a hot room when this occurred- the people working with him also felt overheated.  He rested and drank some fluids yesterday and felt better.   He has also felt hot and cold over the last few days. He has felt tired and achy.  No other specific sx but has not felt 100% well He has had a little diarrhea, no vomiting.   No change in his urine that he has noted.   He has been able to eat but appetite not as good as usual He thinks he might have had a fever last night but did not measure it..   He has not noted a cough, ST, or belly pain except he has felt uncomfortable when he drank gatorade.   He did take tylenol last night, nothing so far today  He got quite ill in December- he had sepsis and had to be hospitalized.  He did not want this to happen again so he came in to make sure all is ok  His ID is Dr. Drue SecondSnider.    BP Readings from Last 3 Encounters:  07/30/14 104/80  06/26/14 128/86  05/20/14 118/86     Patient Active Problem List   Diagnosis Date Noted  . Sepsis 03/15/2014  . SIRS (systemic inflammatory response syndrome) 03/15/2014  . Pyrexia   . HIV (human immunodeficiency virus infection)   . Abdominal pain, acute   . Nausea vomiting and diarrhea 03/14/2014  . History of kidney stones 08/12/2011  . Abdominal pain, R sided  periumbilical 05/25/2011  . Human immunodeficiency virus (HIV) disease 02/10/2006  . GASTROENTERITIS 02/10/2006  . ILEUS 02/10/2006    Past Medical History  Diagnosis Date  . HIV infection   . RLQ abdominal pain     Past Surgical History  Procedure Laterality Date  . Hernia repair  2009    Open primary repair Dr. Lindie SpruceWyatt  . Hernia repair  02/2010    Lap VWH  repair with mesh  . Tonsillectomy      History  Substance Use Topics  . Smoking status: Never Smoker   . Smokeless tobacco: Never Used  . Alcohol Use: 0.0 oz/week    0 Standard drinks or equivalent per week     Comment: social    Family History  Problem Relation Age of Onset  . Cancer Father     Allergies  Allergen Reactions  . Percocet [Oxycodone-Acetaminophen] Itching and Other (See Comments)    Extreme agitation    Medication list has been reviewed and updated.  Current Outpatient Prescriptions on File Prior to Visit  Medication Sig Dispense Refill  . elvitegravir-cobicistat-emtricitabine-tenofovir (STRIBILD) 150-150-200-300 MG TABS tablet Take 1 tablet by mouth daily with breakfast. 30 tablet 11   No current facility-administered medications  on file prior to visit.    Review of Systems:  As per HPI- otherwise negative.   Physical Examination: Filed Vitals:   07/30/14 1518  BP: 104/80  Pulse: 86  Temp: 98.2 F (36.8 C)  Resp: 16   Filed Vitals:   07/30/14 1518  Height: 5\' 11"  (1.803 m)  Weight: 232 lb (105.235 kg)   Body mass index is 32.37 kg/(m^2). Ideal Body Weight: Weight in (lb) to have BMI = 25: 178.9  GEN: WDWN, NAD, Non-toxic, A & O x 3, obese, looks well HEENT: Atraumatic, Normocephalic. Neck supple. No masses, No LAD.  Bilateral TM wnl, oropharynx normal.  PEERL,EOMI.   Ears and Nose: No external deformity. CV: RRR, No M/G/R. No JVD. No thrill. No extra heart sounds. PULM: CTA B, no wheezes, crackles, rhonchi. No retractions. No resp. distress. No accessory muscle use. ABD:  S, NT, ND, +BS. No rebound. No HSM.  Benign exam EXTR: No c/c/e NEURO Normal gait.  PSYCH: Normally interactive. Conversant. Not depressed or anxious appearing.  Calm demeanor.   Assessment and Plan: Dehydration - Plan: POCT UA - Microscopic Only, POCT urinalysis dipstick, POCT CBC, Comprehensive metabolic panel, CK  Reassured that so far his labs look ok, his urine does not suggest serious dehydration.  Will await his other labs and asked him to closely monitor his sx, rest and hydrate well.  See patient instructions for more details.     Signed Abbe AmsterdamJessica Copland, MD  Called 5/5- LMOM that the rest of his labs look good.  Call me if not feeling better  Results for orders placed or performed in visit on 07/30/14  Comprehensive metabolic panel  Result Value Ref Range   Sodium 138 135 - 145 mEq/L   Potassium 4.2 3.5 - 5.3 mEq/L   Chloride 100 96 - 112 mEq/L   CO2 27 19 - 32 mEq/L   Glucose, Bld 79 70 - 99 mg/dL   BUN 9 6 - 23 mg/dL   Creat 8.291.00 5.620.50 - 1.301.35 mg/dL   Total Bilirubin 0.5 0.2 - 1.2 mg/dL   Alkaline Phosphatase 39 39 - 117 U/L   AST 16 0 - 37 U/L   ALT 20 0 - 53 U/L   Total Protein 7.5 6.0 - 8.3 g/dL   Albumin 4.2 3.5 - 5.2 g/dL   Calcium 9.1 8.4 - 86.510.5 mg/dL  CK  Result Value Ref Range   Total CK 107 7 - 232 U/L  POCT UA - Microscopic Only  Result Value Ref Range   WBC, Ur, HPF, POC 0-2    RBC, urine, microscopic 0-1    Bacteria, U Microscopic neg    Mucus, UA positive    Epithelial cells, urine per micros neg    Crystals, Ur, HPF, POC neg    Casts, Ur, LPF, POC neg    Yeast, UA neg   POCT urinalysis dipstick  Result Value Ref Range   Color, UA yellow    Clarity, UA clear    Glucose, UA neg    Bilirubin, UA neg    Ketones, UA neg    Spec Grav, UA 1.020    Blood, UA tr-lysed    pH, UA 5.5    Protein, UA neg    Urobilinogen, UA 0.2    Nitrite, UA neg    Leukocytes, UA Negative   POCT CBC  Result Value Ref Range   WBC 7.3 4.6 - 10.2 K/uL   Lymph,  poc 1.6 0.6 - 3.4  POC LYMPH PERCENT 21.5 10 - 50 %L   MID (cbc) 0.6 0 - 0.9   POC MID % 8.3 0 - 12 %M   POC Granulocyte 5.1 2 - 6.9   Granulocyte percent 70.2 37 - 80 %G   RBC 5.34 4.69 - 6.13 M/uL   Hemoglobin 15.7 14.1 - 18.1 g/dL   HCT, POC 16.1 09.6 - 53.7 %   MCV 89.3 80 - 97 fL   MCH, POC 29.5 27 - 31.2 pg   MCHC 33.0 31.8 - 35.4 g/dL   RDW, POC 04.5 %   Platelet Count, POC 341 142 - 424 K/uL   MPV 6.8 0 - 99.8 fL

## 2014-09-01 ENCOUNTER — Ambulatory Visit (INDEPENDENT_AMBULATORY_CARE_PROVIDER_SITE_OTHER): Payer: 59 | Admitting: Family Medicine

## 2014-09-01 ENCOUNTER — Ambulatory Visit (INDEPENDENT_AMBULATORY_CARE_PROVIDER_SITE_OTHER): Payer: 59

## 2014-09-01 VITALS — BP 128/86 | HR 100 | Temp 99.2°F | Resp 17 | Ht 70.5 in | Wt 227.0 lb

## 2014-09-01 DIAGNOSIS — R059 Cough, unspecified: Secondary | ICD-10-CM

## 2014-09-01 DIAGNOSIS — R509 Fever, unspecified: Secondary | ICD-10-CM

## 2014-09-01 DIAGNOSIS — R05 Cough: Secondary | ICD-10-CM

## 2014-09-01 LAB — POCT CBC
Granulocyte percent: 68 %G (ref 37–80)
HCT, POC: 48.9 % (ref 43.5–53.7)
Hemoglobin: 15.9 g/dL (ref 14.1–18.1)
LYMPH, POC: 2.1 (ref 0.6–3.4)
MCH, POC: 28.7 pg (ref 27–31.2)
MCHC: 32.5 g/dL (ref 31.8–35.4)
MCV: 88.5 fL (ref 80–97)
MID (cbc): 1.2 — AB (ref 0–0.9)
MPV: 7.2 fL (ref 0–99.8)
PLATELET COUNT, POC: 327 10*3/uL (ref 142–424)
POC Granulocyte: 7 — AB (ref 2–6.9)
POC LYMPH PERCENT: 20.2 %L (ref 10–50)
POC MID %: 11.8 % (ref 0–12)
RBC: 5.52 M/uL (ref 4.69–6.13)
RDW, POC: 13.1 %
WBC: 10.3 10*3/uL — AB (ref 4.6–10.2)

## 2014-09-01 LAB — POCT INFLUENZA A/B
INFLUENZA A, POC: NEGATIVE
INFLUENZA B, POC: NEGATIVE

## 2014-09-01 MED ORDER — CEFDINIR 300 MG PO CAPS: 300.0000 mg | ORAL_CAPSULE | Freq: Two times a day (BID) | ORAL | Status: DC

## 2014-09-01 NOTE — Progress Notes (Signed)
[facility] [address] [place] [address] [phone]  Date:  09/01/2014   Name:  Eric Chapman.   DOB:  1965/08/17   MRN:  [id number]  PCP:  Dois Davenport., MD    Chief Complaint: Fever; Cough; Nasal Congestion; and Chest Pain   History of Present Illness:  Eric Chapman. is a 49 y.o. very pleasant male patient who presents with the following:  He is here today with illness.  States " I have sinus infection" as he notes discolored mucus from his nose and productive cough  He noted onset of sx yesterday.  He felt a little tired the evening before, but yesterday is when he really got sick Yesterday he had a fever of 102, then 103.  This am it was 102.  He did check his temp at home prior to coming into clinic and it was back to normal normal.   He also started coughing up discolored mucus today- "it was infection, it was a blob of stuff and blood in it." No GI symptoms.   He has treated HIV disease under fairly good control.  Most recent counts in March of this year showed just slightly low helper T cells  He has used ibuprofen for his sx- his last dose was today at 0800.  "I feel like it's wearing off now."     He did get quite sick over the winter and had to be admitted- he does not feel nearly as bad today as he did then  Patient Active Problem List   Diagnosis Date Noted  . Sepsis 03/15/2014  . SIRS (systemic inflammatory response syndrome) 03/15/2014  . Pyrexia   . HIV (human immunodeficiency virus infection)   . Abdominal pain, acute   . Nausea vomiting and diarrhea 03/14/2014  . History of kidney stones 08/12/2011  . Abdominal pain, R sided periumbilical 05/25/2011  . Human immunodeficiency virus (HIV) disease 02/10/2006  . GASTROENTERITIS 02/10/2006  . ILEUS 02/10/2006    Past Medical History  Diagnosis Date  . HIV infection   . RLQ abdominal pain     Past Surgical History  Procedure Laterality Date  . Hernia repair  2009   Open primary repair Dr. Lindie Spruce  . Hernia repair  02/2010    Lap VWH  repair with mesh  . Tonsillectomy      History  Substance Use Topics  . Smoking status: Never Smoker   . Smokeless tobacco: Never Used  . Alcohol Use: 0.0 oz/week    0 Standard drinks or equivalent per week     Comment: social    Family History  Problem Relation Age of Onset  . Cancer Father     Allergies  Allergen Reactions  . Percocet [Oxycodone-Acetaminophen] Itching and Other (See Comments)    Extreme agitation    Medication list has been reviewed and updated.  Current Outpatient Prescriptions on File Prior to Visit  Medication Sig Dispense Refill  . elvitegravir-cobicistat-emtricitabine-tenofovir (STRIBILD) 150-150-200-300 MG TABS tablet Take 1 tablet by mouth daily with breakfast. 30 tablet 11   No current facility-administered medications on file prior to visit.    Review of Systems:  As per HPI- otherwise negative.   Physical Examination: Filed Vitals:   09/01/14 1427  BP: 128/86  Pulse: 100  Temp: 99.2 F (37.3 C)  Resp: 17   Filed Vitals:   09/01/14 1427  Height: 5' 10.5" (1.791 m)  Weight: 227 lb (102.967 kg)  Body mass index is 32.1 kg/(m^2). Ideal Body Weight: Weight in (lb) to have BMI = 25: 176.4  GEN: WDWN, NAD, Non-toxic, A & O x 3, overweight, looks well today HEENT: Atraumatic, Normocephalic. Neck supple. No masses, No LAD.  Bilateral TM wnl, oropharynx normal.  PEERL,EOMI.   Ears and Nose: No external deformity. CV: RRR, No M/G/R. No JVD. No thrill. No extra heart sounds. PULM: CTA B, no wheezes, crackles, rhonchi. No retractions. No resp. distress. No accessory muscle use. ABD: S, NT, ND EXTR: No c/c/e NEURO Normal gait.  PSYCH: Normally interactive. Conversant. Not depressed or anxious appearing.  Calm demeanor.   [facility] reading (PRIMARY) by  Dr. Patsy Lageropland. CXR:  No definite infiltrate  CHEST 2 VIEW  COMPARISON: None.  FINDINGS: The heart size and  mediastinal contours are within normal limits. Calcified granulomas noted in the right upper lobe. Both lungs are clear. The visualized skeletal structures are unremarkable.  IMPRESSION: No active cardiopulmonary disease.  Results for orders placed or performed in visit on 09/01/14  POCT CBC  Result Value Ref Range   WBC 10.3 (A) 4.6 - 10.2 K/uL   Lymph, poc 2.1 0.6 - 3.4   POC LYMPH PERCENT 20.2 10 - 50 %L   MID (cbc) 1.2 (A) 0 - 0.9   POC MID % 11.8 0 - 12 %M   POC Granulocyte 7.0 (A) 2 - 6.9   Granulocyte percent 68.0 37 - 80 %G   RBC 5.52 4.69 - 6.13 M/uL   Hemoglobin 15.9 14.1 - 18.1 g/dL   HCT, POC 40.948.9 81.143.5 - 53.7 %   MCV 88.5 80 - 97 fL   MCH, POC 28.7 27 - 31.2 pg   MCHC 32.5 31.8 - 35.4 g/dL   RDW, POC 91.413.1 %   Platelet Count, POC 327 142 - 424 K/uL   MPV 7.2 0 - 99.8 fL  POCT Influenza A/B  Result Value Ref Range   Influenza A, POC Negative    Influenza B, POC Negative       Assessment and Plan: Fever and chills - Plan: DG Chest 2 View, cefdinir (OMNICEF) 300 MG capsule  Cough - Plan: POCT CBC, DG Chest 2 View, cefdinir (OMNICEF) 300 MG capsule  Her today with illness and significant fever at home.  He is currently AF and is not neutropenic.  Given his history of HIV and suppressed immunity will treat with omnicef for sinusitis/ possible early pneumonia.  Cautioned him to seek care again if not improving in the next couple of days- Sooner if worse.     Signed Abbe AmsterdamJessica Shaneya Taketa, MD

## 2014-09-01 NOTE — Patient Instructions (Signed)
We are going to treat you for any sinus or chest infection with omnicef- please let me know if you are not improved in the next 1-2 days, sooner if you get worse or continue to run high fevers.

## 2014-09-02 ENCOUNTER — Telehealth: Payer: Self-pay

## 2014-09-02 NOTE — Telephone Encounter (Signed)
Pt saw Copland yesterday for illness and she prescribed antibiotics.  However, he is still coughing non stop and can not rest.  Can we please call him in some cough medicine.  [phone]

## 2014-09-03 NOTE — Telephone Encounter (Signed)
Called him back- reached him.  He had this high fever at 0400 this am.  He is feeling much better right now per his report.  Last dose of ibuprofen was at 2pm.  He agrees to come in tomorrow if he has any fever at all, he does not feel like he needs to come in now as he is feeling improved

## 2014-09-03 NOTE — Telephone Encounter (Signed)
[facility]  Please seek care with us or at ED today.

## 2014-09-03 NOTE — Telephone Encounter (Signed)
Patint is calling back for some advising. His temperature is 103 and he would like advise on how to make the antibiotics kick in. Please call! [phone]

## 2014-09-04 ENCOUNTER — Ambulatory Visit (INDEPENDENT_AMBULATORY_CARE_PROVIDER_SITE_OTHER): Payer: 59 | Admitting: Family Medicine

## 2014-09-04 ENCOUNTER — Ambulatory Visit (INDEPENDENT_AMBULATORY_CARE_PROVIDER_SITE_OTHER): Payer: 59

## 2014-09-04 VITALS — BP 132/86 | HR 101 | Temp 99.7°F | Resp 14 | Ht 70.0 in | Wt 227.4 lb

## 2014-09-04 DIAGNOSIS — R509 Fever, unspecified: Secondary | ICD-10-CM

## 2014-09-04 DIAGNOSIS — R059 Cough, unspecified: Secondary | ICD-10-CM

## 2014-09-04 DIAGNOSIS — R05 Cough: Secondary | ICD-10-CM | POA: Diagnosis not present

## 2014-09-04 LAB — POCT CBC
GRANULOCYTE PERCENT: 68.9 % (ref 37–80)
HCT, POC: 45.2 % (ref 43.5–53.7)
Hemoglobin: 15 g/dL (ref 14.1–18.1)
Lymph, poc: 2.3 (ref 0.6–3.4)
MCH: 29 pg (ref 27–31.2)
MCHC: 33.1 g/dL (ref 31.8–35.4)
MCV: 87.4 fL (ref 80–97)
MID (cbc): 0.6 (ref 0–0.9)
MPV: 6.5 fL (ref 0–99.8)
POC Granulocyte: 6.5 (ref 2–6.9)
POC LYMPH %: 24.5 % (ref 10–50)
POC MID %: 6.6 %M (ref 0–12)
Platelet Count, POC: 351 10*3/uL (ref 142–424)
RBC: 5.17 M/uL (ref 4.69–6.13)
RDW, POC: 12.3 %
WBC: 9.5 10*3/uL (ref 4.6–10.2)

## 2014-09-04 MED ORDER — AZITHROMYCIN 250 MG PO TABS: ORAL_TABLET | ORAL | Status: DC

## 2014-09-04 NOTE — Patient Instructions (Signed)
Continue to take the omnicef, and add the azithromycin antibiotic.  Let me know if you continue to run any fevers!

## 2014-09-04 NOTE — Progress Notes (Signed)
[facility] [address] [place] [address] [phone]  Date:  09/04/2014   Name:  Eric Chapman.   DOB:  28-Aug-1965   MRN:  [id number]  PCP:  Dois Davenport., MD    Chief Complaint: Follow-up; Cough; Nasal Congestion; and Chills   History of Present Illness:  Eric Chapman. is a 49 y.o. very pleasant male patient who presents with the following:  Here today for a recheck- seen here on 6/6 and was started on omnicef for cough and fever in an HIV positive pt. We had spoken on the phone and I was concerned about his reports of fever up to 103 over the last couple of days and asked him to come back in.   He has not had any antipyretics since last night.  He checked his temp at home prior to coming in and it was normal.   He did not run a fever last night as far as he can tell.  Maybe to tmax 100 yesterday The cough does seem to be worse; he is coughing up come yellow mucus.   No nausea or vomiting He is eating well.   Patient Active Problem List   Diagnosis Date Noted  . Sepsis 03/15/2014  . SIRS (systemic inflammatory response syndrome) 03/15/2014  . Pyrexia   . HIV (human immunodeficiency virus infection)   . Abdominal pain, acute   . Nausea vomiting and diarrhea 03/14/2014  . History of kidney stones 08/12/2011  . Abdominal pain, R sided periumbilical 05/25/2011  . Human immunodeficiency virus (HIV) disease 02/10/2006  . GASTROENTERITIS 02/10/2006  . ILEUS 02/10/2006    Past Medical History  Diagnosis Date  . HIV infection   . RLQ abdominal pain     Past Surgical History  Procedure Laterality Date  . Hernia repair  2009    Open primary repair Dr. Lindie Spruce  . Hernia repair  02/2010    Lap VWH  repair with mesh  . Tonsillectomy      History  Substance Use Topics  . Smoking status: Never Smoker   . Smokeless tobacco: Never Used  . Alcohol Use: 0.0 oz/week    0 Standard drinks or equivalent per week     Comment: social    Family  History  Problem Relation Age of Onset  . Cancer Father     Allergies  Allergen Reactions  . Percocet [Oxycodone-Acetaminophen] Itching and Other (See Comments)    Extreme agitation    Medication list has been reviewed and updated.  Current Outpatient Prescriptions on File Prior to Visit  Medication Sig Dispense Refill  . cefdinir (OMNICEF) 300 MG capsule Take 1 capsule (300 mg total) by mouth 2 (two) times daily. 20 capsule 0  . elvitegravir-cobicistat-emtricitabine-tenofovir (STRIBILD) 150-150-200-300 MG TABS tablet Take 1 tablet by mouth daily with breakfast. 30 tablet 11   No current facility-administered medications on file prior to visit.    Review of Systems:  As per HPI- otherwise negative.   Physical Examination: Filed Vitals:   09/04/14 1531  BP: 132/86  Pulse: 83  Temp: 99.7 F (37.6 C)  Resp: 14   Filed Vitals:   09/04/14 1531  Height:  (1.778 m)  Weight: 227 lb 6.4 oz (103.148 kg)   Body mass index is 32.63 kg/(m^2). Ideal Body Weight: Weight in (lb) to have BMI = 25: 173.9  GEN: WDWN, NAD, Non-toxic, A & O x 3, overweight, looks well, some cough in room  HEENT: Atraumatic, Normocephalic. Neck supple. No masses, No LAD.    Bilateral TM wnl, oropharynx normal.  PEERL,EOMI.   Ears and Nose: No external deformity. CV: RRR, No M/G/R. No JVD. No thrill. No extra heart sounds. PULM: CTA B, no wheezes, crackles, rhonchi. No retractions. No resp. distress. No accessory muscle use. ABD: S, NT, ND EXTR: No c/c/e NEURO Normal gait.  PSYCH: Normally interactive. Conversant. Not depressed or anxious appearing.  Calm demeanor.   [facility] reading (PRIMARY) by  Dr. Patsy Lager. CXR: negative  COMPARISON: Chest x-ray of 09/01/2014  FINDINGS: No pneumonia or effusion is seen. There is some peribronchial thickening which may indicate bronchitis. Mediastinal and hilar contours are unremarkable. The heart is within normal limits in size. No bony abnormality is  seen.  IMPRESSION: No pneumonia. Peribronchial thickening may indicate bronchitis.  Results for orders placed or performed in visit on 09/04/14  POCT CBC  Result Value Ref Range   WBC 9.5 4.6 - 10.2 K/uL   Lymph, poc 2.3 0.6 - 3.4   POC LYMPH PERCENT 24.5 10 - 50 %L   MID (cbc) 0.6 0 - 0.9   POC MID % 6.6 0 - 12 %M   POC Granulocyte 6.5 2 - 6.9   Granulocyte percent 68.9 37 - 80 %G   RBC 5.17 4.69 - 6.13 M/uL   Hemoglobin 15.0 14.1 - 18.1 g/dL   HCT, POC 56.2 13.0 - 53.7 %   MCV 87.4 80 - 97 fL   MCH, POC 29.0 27 - 31.2 pg   MCHC 33.1 31.8 - 35.4 g/dL   RDW, POC 86.5 %   Platelet Count, POC 351 142 - 424 K/uL   MPV 6.5 0 - 99.8 fL    Assessment and Plan: Cough - Plan: POCT CBC, DG Chest 2 View, azithromycin (ZITHROMAX) 250 MG tablet  Fever, unspecified fever cause - Plan: POCT CBC, DG Chest 2 View, azithromycin (ZITHROMAX) 250 MG tablet  Add azithromycin to cover for atypicals.  He will be sure to let me know if he is not continuing to feel better or if he is running more fevers.   Overall he does seem to be improving   Signed Abbe Amsterdam, MD

## 2014-09-08 ENCOUNTER — Ambulatory Visit (INDEPENDENT_AMBULATORY_CARE_PROVIDER_SITE_OTHER): Payer: 59 | Admitting: Family Medicine

## 2014-09-08 VITALS — BP 122/74 | HR 84 | Temp 98.1°F | Resp 18 | Ht 70.0 in | Wt 230.0 lb

## 2014-09-08 DIAGNOSIS — R05 Cough: Secondary | ICD-10-CM | POA: Diagnosis not present

## 2014-09-08 DIAGNOSIS — R059 Cough, unspecified: Secondary | ICD-10-CM

## 2014-09-08 DIAGNOSIS — R062 Wheezing: Secondary | ICD-10-CM

## 2014-09-08 MED ORDER — ALBUTEROL SULFATE HFA 108 (90 BASE) MCG/ACT IN AERS: 2.0000 | INHALATION_SPRAY | Freq: Four times a day (QID) | RESPIRATORY_TRACT | Status: DC | PRN

## 2014-09-08 MED ORDER — ALBUTEROL SULFATE (2.5 MG/3ML) 0.083% IN NEBU
2.5000 mg | INHALATION_SOLUTION | Freq: Once | RESPIRATORY_TRACT | Status: AC
  Administered 2014-09-08: 2.5 mg via RESPIRATORY_TRACT

## 2014-09-08 MED ORDER — ALBUTEROL SULFATE (2.5 MG/3ML) 0.083% IN NEBU: 2.5000 mg | INHALATION_SOLUTION | Freq: Four times a day (QID) | RESPIRATORY_TRACT | Status: DC | PRN

## 2014-09-08 NOTE — Patient Instructions (Signed)
Use the albuterol (neb OR inhaler/ puffer) every 6 hours as needed Finish out your antibiotic.   I do think that you are improving; let me know if you start gown downhill or running fevers again

## 2014-09-08 NOTE — Progress Notes (Signed)
[facility] [address], [place] [address] [phone]  Date:  09/08/2014   Name:  Eric Chapman.   DOB:  [birth year]   MRN:  [id number]  PCP:  Dois Davenport., MD    Chief Complaint: Follow-up and Cough   History of Present Illness:  Eric Chapman. is a 49 y.o. very pleasant male patient who presents with the following:  He is here today for recheck- seen last week with fevers and bronchitis.  We have used omnicef and azitrhomcyin.  He still has a couple of omnicef pills left.   He notes some chest congestion, wonders if albuterol might be helpful for him  No further fevers He still has low energy but feels that he is improving some  Most recent CXR on 09/04/14.  Showed bronchitis only He has not used albuterol in the past but this is used by several family members in the past for cough  Patient Active Problem List   Diagnosis Date Noted  . Sepsis 03/15/2014  . SIRS (systemic inflammatory response syndrome) 03/15/2014  . Pyrexia   . HIV (human immunodeficiency virus infection)   . Abdominal pain, acute   . Nausea vomiting and diarrhea 03/14/2014  . History of kidney stones 08/12/2011  . Abdominal pain, R sided periumbilical 05/25/2011  . Human immunodeficiency virus (HIV) disease 02/10/2006  . GASTROENTERITIS 02/10/2006  . ILEUS 02/10/2006    Past Medical History  Diagnosis Date  . HIV infection   . RLQ abdominal pain     Past Surgical History  Procedure Laterality Date  . Hernia repair  2009    Open primary repair Dr. Lindie Spruce  . Hernia repair  02/2010    Lap VWH  repair with mesh  . Tonsillectomy      History  Substance Use Topics  . Smoking status: Never Smoker   . Smokeless tobacco: Never Used  . Alcohol Use: 0.0 oz/week    0 Standard drinks or equivalent per week     Comment: social    Family History  Problem Relation Age of Onset  . Cancer Father     Allergies  Allergen Reactions  . Percocet  [Oxycodone-Acetaminophen] Itching and Other (See Comments)    Extreme agitation    Medication list has been reviewed and updated.  Current Outpatient Prescriptions on File Prior to Visit  Medication Sig Dispense Refill  . cefdinir (OMNICEF) 300 MG capsule Take 1 capsule (300 mg total) by mouth 2 (two) times daily. 20 capsule 0  . elvitegravir-cobicistat-emtricitabine-tenofovir (STRIBILD) 150-150-200-300 MG TABS tablet Take 1 tablet by mouth daily with breakfast. 30 tablet 11  . azithromycin (ZITHROMAX) 250 MG tablet Use as a zpack (Patient not taking: Reported on 09/08/2014) 6 tablet 0   No current facility-administered medications on file prior to visit.    Review of Systems:  As per HPI- otherwise negative.   Physical Examination: Filed Vitals:   09/08/14 1340  BP: 122/74  Pulse: 84  Temp: 98.1 F (36.7 C)  Resp: 18   Filed Vitals:   09/08/14 1340  Height: 5\' 10"  (1.778 m)  Weight: 230 lb (104.327 kg)   Body mass index is 33 kg/(m^2). Ideal Body Weight: Weight in (lb) to have BMI = 25: 173.9  GEN: WDWN, NAD, Non-toxic, A & O x 3, obese, looks well HEENT: Atraumatic, Normocephalic. Neck supple. No masses, No LAD.  Bilateral TM wnl, oropharynx normal.  PEERL,EOMI.   Ears and Nose: No external  deformity. CV: RRR, No M/G/R. No JVD. No thrill. No extra heart sounds. PULM: CTA B, no  crackles, rhonchi. No retractions. No resp. distress. No accessory muscle use.  Mild end expiratory wheezes  ABD: S, NT, ND, +BS. No rebound. No HSM. EXTR: No c/c/e NEURO Normal gait.  PSYCH: Normally interactive. Conversant. Not depressed or anxious appearing.  Calm demeanor.   Albuterol neb:  Improved his air flow and he did feel that this gave him some improvement  Assessment and Plan: Wheezing - Plan: albuterol (PROVENTIL) (2.5 MG/3ML) 0.083% nebulizer solution 2.5 mg, albuterol (PROVENTIL HFA;VENTOLIN HFA) 108 (90 BASE) MCG/ACT inhaler, albuterol (PROVENTIL) (2.5 MG/3ML) 0.083% nebulizer  solution  Cough - Plan: albuterol (PROVENTIL) (2.5 MG/3ML) 0.083% nebulizer solution 2.5 mg  rx albuterol for him- his mother has a neb machine that he can use, and he would like an inhaler to use as needed He will follow-up if not continuing to improve  Signed Abbe Amsterdam, MD

## 2014-10-07 ENCOUNTER — Other Ambulatory Visit: Payer: 59

## 2014-10-21 ENCOUNTER — Ambulatory Visit: Payer: 59 | Admitting: Internal Medicine

## 2015-06-15 ENCOUNTER — Ambulatory Visit (INDEPENDENT_AMBULATORY_CARE_PROVIDER_SITE_OTHER): Payer: BLUE CROSS/BLUE SHIELD | Admitting: Internal Medicine

## 2015-06-15 VITALS — BP 124/80 | HR 113 | Temp 100.4°F | Resp 18 | Ht 69.5 in | Wt 218.0 lb

## 2015-06-15 DIAGNOSIS — R059 Cough, unspecified: Secondary | ICD-10-CM

## 2015-06-15 DIAGNOSIS — B20 Human immunodeficiency virus [HIV] disease: Secondary | ICD-10-CM | POA: Diagnosis not present

## 2015-06-15 DIAGNOSIS — R05 Cough: Secondary | ICD-10-CM | POA: Diagnosis not present

## 2015-06-15 DIAGNOSIS — J3489 Other specified disorders of nose and nasal sinuses: Secondary | ICD-10-CM | POA: Diagnosis not present

## 2015-06-15 DIAGNOSIS — R509 Fever, unspecified: Secondary | ICD-10-CM | POA: Diagnosis not present

## 2015-06-15 LAB — POCT INFLUENZA A/B
Influenza A, POC: NEGATIVE
Influenza B, POC: NEGATIVE

## 2015-06-15 MED ORDER — CEFDINIR 300 MG PO CAPS: 300.0000 mg | ORAL_CAPSULE | Freq: Two times a day (BID) | ORAL | Status: DC

## 2015-06-15 MED ORDER — AZITHROMYCIN 250 MG PO TABS: ORAL_TABLET | ORAL | Status: DC

## 2015-06-15 NOTE — Progress Notes (Signed)
Subjective:  By signing my name below, I, Eric Chapman, attest that this documentation has been prepared under the direction and in the presence of Robert P. [name], MD. Electronically Signed: [name], ED Scribe. 06/15/2015. 6:51 PM.   Patient ID: Eric Chapman., male    DOB: October 26, 1965, 50 y.o.   MRN: [id number] Chief Complaint  Patient presents with  . Sore Throat    X last night  . Nasal Congestion    X 2 weeks  . Cough    X 2 weeks   HPI  HPI Comments: Eric Chapman. is a 50 y.o. male who presents to the [facility] complaining of flu-like symptoms that began 2 weeks ago. Pt symptoms began with sinus pressure and chills. Pt currently reports a sore throat, cough, nasal congestion, and fever. Pt denies any appetite changes, or any other symptoms at this time. Still working [employer] representative  Patient Active Problem List   Diagnosis Date Noted  . Sepsis (HCC) 03/15/2014  . SIRS (systemic inflammatory response syndrome) (HCC) 03/15/2014  . Pyrexia   . HIV (human immunodeficiency virus infection) (HCC)   . Abdominal pain, acute   . Nausea vomiting and diarrhea 03/14/2014  . History of kidney stones 08/12/2011  . Abdominal pain, R sided periumbilical 05/25/2011  . Human immunodeficiency virus (HIV) disease (HCC) 02/10/2006  . GASTROENTERITIS 02/10/2006  . ILEUS 02/10/2006   Unsure current CD4  Past Medical History  Diagnosis Date  . HIV infection (HCC)   . RLQ abdominal pain    Current Outpatient Prescriptions on File Prior to Visit  Medication Sig Dispense Refill  . elvitegravir-cobicistat-emtricitabine-tenofovir (STRIBILD) 150-150-200-300 MG TABS tablet Take 1 tablet by mouth daily with breakfast. (Patient not taking: Reported on 06/15/2015) OFF    No current facility-administered medications on file prior to visit.   Allergies  Allergen Reactions  . Percocet [Oxycodone-Acetaminophen] Itching and Other (See Comments)    Extreme agitation     Review of Systems  Constitutional: Positive for fever and chills.  HENT: Positive for sinus pressure and sore throat.   Respiratory: Positive for cough.       Objective:   Physical Exam  Constitutional: He is oriented to person, place, and time. He appears well-developed and well-nourished.  HENT:  Head: Normocephalic and atraumatic.  Purulent nasal discharge; red throat  Cardiovascular: Normal rate.   Pulmonary/Chest: Effort normal and breath sounds normal.  Abdominal: He exhibits no distension.  Lymphadenopathy:    He has no cervical adenopathy.  Neurological: He is alert and oriented to person, place, and time.  Skin: Skin is warm and dry.  Psychiatric: He has a normal mood and affect.  Nursing note and vitals reviewed. BP 124/80 mmHg  Pulse 113  Temp(Src) 100.4 F (38 C) (Oral)  Resp 18  Ht 5' 9.5" (1.765 m)  Wt 218 lb (98.884 kg)  BMI 31.74 kg/m2  SpO2 96%     Results for orders placed or performed in visit on 06/15/15  POCT Influenza A/B  Result Value Ref Range   Influenza A, POC Negative Negative   Influenza B, POC Negative Negative    Assessment & Plan:  Fever  Rhinorrhea-now purulent  Cough-nonproductive   HIV disease (HCC)--out of treatment--see 3/16 OV Dr Snider//needs immed f/u  Meds ordered this encounter  Medications  . cefdinir (OMNICEF) 300 MG capsule    Sig: Take 1 capsule (300 mg total) by mouth 2 (two) times daily.    Dispense:  20 capsule    Refill:  0  Started as he thinks this is same illn as summer '16 see dr Copeland's OVs Can add zith if not responding BUT needs to restart HIV f/u and Rx and if not better here in response to treatment in 5-7 d he should return for CXR and CBC, CD4 as early P.Car possible     I have completed the patient encounter in its entirety as documented by the scribe, with editing by me where necessary. Robert P. Merla Richesoolittle, M.D.

## 2015-06-15 NOTE — Patient Instructions (Signed)
     IF you received an x-ray today, you will receive an invoice from [facility]. Please contact [facility] at [phone] with questions or concerns regarding your invoice.   IF you received labwork today, you will receive an invoice from [facility]. Please contact [facility] at [phone] with questions or concerns regarding your invoice.   Our billing staff will not be able to assist you with questions regarding bills from these companies.  You will be contacted with the lab results as soon as they are available. The fastest way to get your results is to activate your My Chart account. Instructions are located on the last page of this paperwork. If you have not heard from us regarding the results in 2 weeks, please contact this office.      

## 2015-06-16 ENCOUNTER — Telehealth: Payer: Self-pay | Admitting: *Deleted

## 2015-06-16 ENCOUNTER — Ambulatory Visit (INDEPENDENT_AMBULATORY_CARE_PROVIDER_SITE_OTHER): Payer: BLUE CROSS/BLUE SHIELD | Admitting: Internal Medicine

## 2015-06-16 VITALS — BP 132/80 | HR 94 | Temp 98.7°F | Resp 16 | Ht 71.0 in | Wt 221.4 lb

## 2015-06-16 DIAGNOSIS — J3489 Other specified disorders of nose and nasal sinuses: Secondary | ICD-10-CM | POA: Diagnosis not present

## 2015-06-16 DIAGNOSIS — R05 Cough: Secondary | ICD-10-CM | POA: Diagnosis not present

## 2015-06-16 DIAGNOSIS — R509 Fever, unspecified: Secondary | ICD-10-CM | POA: Diagnosis not present

## 2015-06-16 DIAGNOSIS — R059 Cough, unspecified: Secondary | ICD-10-CM

## 2015-06-16 LAB — POCT URINALYSIS DIP (MANUAL ENTRY)
BILIRUBIN UA: NEGATIVE
BILIRUBIN UA: NEGATIVE
Glucose, UA: NEGATIVE
LEUKOCYTES UA: NEGATIVE
NITRITE UA: NEGATIVE
PH UA: 5.5
PROTEIN UA: NEGATIVE
Spec Grav, UA: <=1.005
Urobilinogen, UA: 0.2

## 2015-06-16 LAB — POC MICROSCOPIC URINALYSIS (UMFC): MUCUS RE: ABSENT

## 2015-06-16 NOTE — Progress Notes (Signed)
Subjective:  By signing my name below, I, Eric Chapman, attest that this documentation has been prepared under the direction and in the presence of Eric Pearsonobert P Saarah Dewing, MD  Electronically Signed: Charline BillsEssence Chapman, ED Scribe 06/16/2015 at 6:38 PM.   Patient ID: Eric SereneJames T Symanski Jr., male    DOB: 10/28/1965, 50 y.o.   MRN: [id number]  Chief Complaint  Patient presents with  . Follow-up    feeling foggy /sinus    HPI HPI Comments: Eric SereneJames T Engdahl Jr. is a 50 y.o. male, with a h/o HIV, who presents to the [facility] complaining of persistent fatigue since yesterday. Pt states that he gets dehydrated often and states that this feels similar. Pt states that he has tried Pedialyte in the past to treat dehydration. He reports associated fever last night that has resolved. Triage temperature 98.7 F. He has been off of his HIV medications for a while since he lost his insurance and is currently self-employed. He wants to be contacted via his his cell first when his UA results, however, if his phone is dead he can be contacted via his mother's cell at [phone].   Past Medical History  Diagnosis Date  . HIV infection (HCC)   . RLQ abdominal pain    Current Outpatient Prescriptions on File Prior to Visit  Medication Sig Dispense Refill  . cefdinir (OMNICEF) 300 MG capsule Take 1 capsule (300 mg total) by mouth 2 (two) times daily. 20 capsule 0  . albuterol (PROVENTIL HFA;VENTOLIN HFA) 108 (90 BASE) MCG/ACT inhaler Inhale 2 puffs into the lungs every 6 (six) hours as needed for wheezing or shortness of breath. (Patient not taking: Reported on 06/15/2015) 1 Inhaler 0  . albuterol (PROVENTIL) (2.5 MG/3ML) 0.083% nebulizer solution Take 3 mLs (2.5 mg total) by nebulization every 6 (six) hours as needed for wheezing or shortness of breath. (Patient not taking: Reported on 06/15/2015) 150 mL 1  . azithromycin (ZITHROMAX) 250 MG tablet As packaged (Patient not taking: Reported on 06/16/2015) 6  tablet 0  . cefdinir (OMNICEF) 300 MG capsule Take 1 capsule (300 mg total) by mouth 2 (two) times daily. (Patient not taking: Reported on 06/15/2015) 20 capsule 0  . elvitegravir-cobicistat-emtricitabine-tenofovir (STRIBILD) 150-150-200-300 MG TABS tablet Take 1 tablet by mouth daily with breakfast. (Patient not taking: Reported on 06/15/2015) 30 tablet 11   No current facility-administered medications on file prior to visit.   Allergies  Allergen Reactions  . Percocet [Oxycodone-Acetaminophen] Itching and Other (See Comments)    Extreme agitation   Review of Systems  Constitutional: Positive for fever (resolved) and fatigue.   BP 132/80 mmHg  Pulse 94  Temp(Src) 98.7 F (37.1 C) (Oral)  Resp 16  Ht 5\' 11"  (1.803 m)  Wt 221 lb 6.4 oz (100.426 kg)  BMI 30.89 kg/m2  SpO2 97%    Objective:   Physical Exam  Constitutional: He is oriented to person, place, and time. He appears well-developed and well-nourished. No distress.  HENT:  Head: Normocephalic and atraumatic.  Lips moist. Tongue dry.   Eyes: Conjunctivae and EOM are normal.  Neck: Neck supple.  Cardiovascular: Normal rate, regular rhythm and normal heart sounds.   Pulmonary/Chest: Effort normal and breath sounds normal. No respiratory distress.  Lungs are clear to auscultation.   Musculoskeletal: Normal range of motion.  Neurological: He is alert and oriented to person, place, and time.  Skin: Skin is warm and dry.  Normal skin turgor.   Psychiatric: He has  a normal mood and affect. His behavior is normal.  Nursing note and vitals reviewed.  Results for orders placed or performed in visit on 06/16/15  POCT urinalysis dipstick  Result Value Ref Range   Color, UA yellow yellow   Clarity, UA clear clear   Glucose, UA negative negative   Bilirubin, UA negative negative   Ketones, POC UA negative negative   Spec Grav, UA <=1.005    Blood, UA trace-lysed (A) negative   pH, UA 5.5    Protein Ur, POC negative negative     Urobilinogen, UA 0.2    Nitrite, UA Negative Negative   Leukocytes, UA Negative Negative  POCT Microscopic Urinalysis ([facility])  Result Value Ref Range   WBC,UR,HPF,POC None None WBC/hpf   RBC,UR,HPF,POC None None RBC/hpf   Bacteria None None, Too numerous to count   Mucus Absent Absent   Epithelial Cells, UR Per Microscopy Few (A) None, Too numerous to count cells/hpf      Assessment & Plan:  Fever, unspecified fever cause -Cough and Rhinorrhea--actually better today  Reassured by phone message is not dehydrated--cont meds --incr fluids He called Dr Drue Second today to set up f/u.   I have completed the patient encounter in its entirety as documented by the scribe, with editing by me where necessary. Eric Chapman, M.D.

## 2015-06-16 NOTE — Telephone Encounter (Signed)
Left detailed message patient is not dehydrated and urine looks fine Per  Dr. Merla Richesoolittle

## 2015-06-16 NOTE — Progress Notes (Signed)
Tried to call pt, LMOM to CB to give status update on his condition and will ask him about f/up with Dr Drue SecondSnider at that time.

## 2015-06-16 NOTE — Patient Instructions (Signed)
     IF you received an x-ray today, you will receive an invoice from [facility]. Please contact [facility] at [phone] with questions or concerns regarding your invoice.   IF you received labwork today, you will receive an invoice from [facility]. Please contact [facility] at [phone] with questions or concerns regarding your invoice.   Our billing staff will not be able to assist you with questions regarding bills from these companies.  You will be contacted with the lab results as soon as they are available. The fastest way to get your results is to activate your My Chart account. Instructions are located on the last page of this paperwork. If you have not heard from us regarding the results in 2 weeks, please contact this office.      

## 2015-06-16 NOTE — Addendum Note (Signed)
Addended by: Sheppard PlumberBRIGGS, Christl Fessenden A on: 06/16/2015 04:25 PM   Modules accepted: Kipp BroodSmartSet

## 2015-06-16 NOTE — Addendum Note (Signed)
Addended by: Sheppard PlumberBRIGGS, Lakela Kuba A on: 06/16/2015 04:23 PM   Modules accepted: Kipp BroodSmartSet

## 2015-06-16 NOTE — Addendum Note (Signed)
Addended by: Sheppard PlumberBRIGGS, Nimrat Woolworth A on: 06/16/2015 04:22 PM   Modules accepted: Kipp BroodSmartSet

## 2015-06-24 NOTE — Progress Notes (Signed)
Pt came in to be seen again on 3/21 and reported that he had contacted Dr Feliz BeamSnider's office to set up appt.

## 2015-08-27 ENCOUNTER — Encounter: Payer: Self-pay | Admitting: Internal Medicine

## 2015-08-27 ENCOUNTER — Ambulatory Visit (INDEPENDENT_AMBULATORY_CARE_PROVIDER_SITE_OTHER): Payer: BLUE CROSS/BLUE SHIELD | Admitting: Internal Medicine

## 2015-08-27 VITALS — BP 138/94 | HR 105 | Temp 98.0°F | Ht 71.0 in | Wt 222.0 lb

## 2015-08-27 DIAGNOSIS — B20 Human immunodeficiency virus [HIV] disease: Secondary | ICD-10-CM | POA: Diagnosis not present

## 2015-08-27 LAB — CBC WITH DIFFERENTIAL/PLATELET
Basophils Absolute: 0 cells/uL (ref 0–200)
Basophils Relative: 0 %
EOS PCT: 4 %
Eosinophils Absolute: 240 cells/uL (ref 15–500)
HCT: 44.4 % (ref 38.5–50.0)
HEMOGLOBIN: 14.6 g/dL (ref 13.2–17.1)
LYMPHS ABS: 1800 {cells}/uL (ref 850–3900)
Lymphocytes Relative: 30 %
MCH: 28 pg (ref 27.0–33.0)
MCHC: 32.9 g/dL (ref 32.0–36.0)
MCV: 85.2 fL (ref 80.0–100.0)
MONOS PCT: 11 %
MPV: 9.3 fL (ref 7.5–12.5)
Monocytes Absolute: 660 cells/uL (ref 200–950)
NEUTROS ABS: 3300 {cells}/uL (ref 1500–7800)
NEUTROS PCT: 55 %
PLATELETS: 286 10*3/uL (ref 140–400)
RBC: 5.21 MIL/uL (ref 4.20–5.80)
RDW: 13.7 % (ref 11.0–15.0)
WBC: 6 10*3/uL (ref 3.8–10.8)

## 2015-08-27 LAB — LIPID PANEL
CHOL/HDL RATIO: 4.4 ratio (ref <=5.0)
Cholesterol: 132 mg/dL (ref 125–200)
HDL: 30 mg/dL — AB (ref >=40)
LDL CALC: 74 mg/dL (ref <130)
TRIGLYCERIDES: 141 mg/dL (ref <150)
VLDL: 28 mg/dL (ref <30)

## 2015-08-27 LAB — COMPLETE METABOLIC PANEL WITH GFR
ALBUMIN: 4.5 g/dL (ref 3.6–5.1)
ALT: 30 U/L (ref 9–46)
AST: 24 U/L (ref 10–40)
Alkaline Phosphatase: 34 U/L — ABNORMAL LOW (ref 40–115)
BUN: 10 mg/dL (ref 7–25)
CHLORIDE: 103 mmol/L (ref 98–110)
CO2: 25 mmol/L (ref 20–31)
Calcium: 9.1 mg/dL (ref 8.6–10.3)
Creat: 0.84 mg/dL (ref 0.60–1.35)
GFR, Est African American: >89 mL/min (ref >=60)
GFR, Est Non African American: >89 mL/min (ref >=60)
GLUCOSE: 90 mg/dL (ref 65–99)
POTASSIUM: 4.1 mmol/L (ref 3.5–5.3)
SODIUM: 137 mmol/L (ref 135–146)
Total Bilirubin: 0.5 mg/dL (ref 0.2–1.2)
Total Protein: 7.7 g/dL (ref 6.1–8.1)

## 2015-08-27 MED ORDER — ELVITEG-COBIC-EMTRICIT-TENOFDF 150-150-200-300 MG PO TABS: 1.0000 | ORAL_TABLET | Freq: Every day | ORAL | Status: DC

## 2015-08-27 NOTE — Progress Notes (Signed)
Patient ID: Eric SereneJames T Fellows Jr., male   DOB: 01/05/1966, 50 y.o.   MRN: [id number]     RFV: returning back into care  Patient ID: Eric SereneJames T Demirjian Jr., male   DOB: 11/26/1965, 50 y.o.   MRN: [id number]  HPI 50yo M with HIV disease, previously well controlled, CD 4 count of 460/VL 25  in MARch 2016 on stribild. He states that he has been off of medications for greater than a year, where he stopped medications, "just a mental thing"- not ready to be in care. 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  Interestingly, didn't expand much details "mental thing". He has disclosed his hiv status to others, including family but not mention what specific barrier he has to taking medication  In good health. Last week he thought he was developing mrsa boil to right distal thigh that resolved on its own  Outpatient Encounter Prescriptions as of 08/27/2015  Medication Sig  . albuterol (PROVENTIL HFA;VENTOLIN HFA) 108 (90 BASE) MCG/ACT inhaler Inhale 2 puffs into the lungs every 6 (six) hours as needed for wheezing or shortness of breath. (Patient not taking: Reported on 06/15/2015)  . albuterol (PROVENTIL) (2.5 MG/3ML) 0.083% nebulizer solution Take 3 mLs (2.5 mg total) by nebulization every 6 (six) hours as needed for wheezing or shortness of breath. (Patient not taking: Reported on 06/15/2015)  . azithromycin (ZITHROMAX) 250 MG tablet As packaged (Patient not taking: Reported on 06/16/2015)  . cefdinir (OMNICEF) 300 MG capsule Take 1 capsule (300 mg total) by mouth 2 (two) times daily. (Patient not taking: Reported on 06/15/2015)  . cefdinir (OMNICEF) 300 MG capsule Take 1 capsule (300 mg total) by mouth 2 (two) times daily. (Patient not taking: Reported on 08/27/2015)  . elvitegravir-cobicistat-emtricitabine-tenofovir (STRIBILD) 150-150-200-300 MG  TABS tablet Take 1 tablet by mouth daily with breakfast. (Patient not taking: Reported on 06/15/2015)   No facility-administered encounter medications on file as of 08/27/2015.     Patient Active Problem List   Diagnosis Date Noted  . Sepsis (HCC) 03/15/2014  . SIRS (systemic inflammatory response syndrome) (HCC) 03/15/2014  . Pyrexia   . HIV (human immunodeficiency virus infection) (HCC)   . Abdominal pain, acute   . Nausea vomiting and diarrhea 03/14/2014  . History of kidney stones 08/12/2011  . Abdominal pain, R sided periumbilical 05/25/2011  . Human immunodeficiency virus (HIV) disease (HCC) 02/10/2006  . GASTROENTERITIS 02/10/2006  . ILEUS 02/10/2006     There are no preventive care reminders to display for this patient.   Review of Systems Per hpi, otherwise 10 point ros is negative Physical Exam   BP 138/94 mmHg  Pulse 105  Temp(Src) 98 F (36.7 C) (Oral)  Ht 5\' 11"  (1.803 m)  Wt 222 lb (100.699 kg)  BMI 30.98 kg/m2 Physical Exam  Constitutional: He is oriented to person, place, and time. He appears well-developed and well-nourished. No distress.  HENT:  Mouth/Throat: Oropharynx is clear and moist. No oropharyngeal exudate.  Cardiovascular: Normal rate, regular rhythm and normal heart sounds. Exam reveals no gallop and no friction rub.  No murmur heard.  Pulmonary/Chest: Effort normal and breath sounds normal. No respiratory distress. He has no wheezes.  Abdominal: Soft. Bowel sounds are normal. He exhibits no distension. There is no tenderness.  Lymphadenopathy:  He  has no cervical adenopathy.  Neurological: He is alert and oriented to person, place, and time.  Skin: Skin is warm and dry. No rash noted. No erythema.  Psychiatric: He has a normal mood and affect. His behavior is normal.    Lab Results  Component Value Date   CD4TCELL 18* 06/26/2014   Lab Results  Component Value Date   CD4TABS 460 06/26/2014   CD4TABS 240* 07/23/2013   CD4TABS 200*  06/10/2013   Lab Results  Component Value Date   HIV1RNAQUANT 25* 06/26/2014   No results found for: HEPBSAB No results found for: RPR  CBC Lab Results  Component Value Date   WBC 9.5 09/04/2014   RBC 5.17 09/04/2014   HGB 15.0 09/04/2014   HCT 45.2 09/04/2014   PLT 356 06/26/2014   MCV 87.4 09/04/2014   MCH 29.0 09/04/2014   MCHC 33.1 09/04/2014   RDW 13.3 06/26/2014   LYMPHSABS 2.7 06/26/2014   MONOABS 0.8 06/26/2014   EOSABS 0.3 06/26/2014   BASOSABS 0.0 06/26/2014   BMET Lab Results  Component Value Date   NA 138 07/30/2014   K 4.2 07/30/2014   CL 100 07/30/2014   CO2 27 07/30/2014   GLUCOSE 79 07/30/2014   BUN 9 07/30/2014   CREATININE 1.00 07/30/2014   CALCIUM 9.1 07/30/2014   GFRNONAA >89 06/26/2014   GFRAA >89 06/26/2014     Assessment and Plan  hiv disease = presently uncontrolled since being off of meds. Will check cd 4 count and viral load, genotype. Will start back on stribild for now.  oi proph = await for labs results to see if needs bactrim for  pcp proph  " mental thing" = i suspect element of denial for why he hasn't been in care. Offered counseling through clinic

## 2015-08-28 LAB — RPR: RPR Ser Ql: REACTIVE — AB

## 2015-08-28 LAB — T-HELPER CELL (CD4) - (RCID CLINIC ONLY)
CD4 % Helper T Cell: 10 % — ABNORMAL LOW (ref 33–55)
CD4 T Cell Abs: 190 /uL — ABNORMAL LOW (ref 400–2700)

## 2015-08-28 LAB — RPR TITER

## 2015-08-28 LAB — FLUORESCENT TREPONEMAL AB(FTA)-IGG-BLD: FLUORESCENT TREPONEMAL ABS: REACTIVE — AB

## 2015-08-31 LAB — HIV-1 RNA ULTRAQUANT REFLEX TO GENTYP+
HIV 1 RNA QUANT: 62561 {copies}/mL — AB (ref <20)
HIV-1 RNA Quant, Log: 4.8 Log copies/mL — ABNORMAL HIGH (ref <1.30)

## 2015-09-12 LAB — HIV-1 GENOTYPR PLUS

## 2015-09-24 ENCOUNTER — Ambulatory Visit (INDEPENDENT_AMBULATORY_CARE_PROVIDER_SITE_OTHER): Payer: BLUE CROSS/BLUE SHIELD | Admitting: Physician Assistant

## 2015-09-24 VITALS — BP 136/84 | HR 91 | Temp 98.5°F | Resp 18 | Ht 71.0 in | Wt 218.0 lb

## 2015-09-24 DIAGNOSIS — H109 Unspecified conjunctivitis: Secondary | ICD-10-CM | POA: Diagnosis not present

## 2015-09-24 MED ORDER — POLYMYXIN B-TRIMETHOPRIM 10000-0.1 UNIT/ML-% OP SOLN: 2.0000 [drp] | OPHTHALMIC | Status: DC

## 2015-09-24 NOTE — Patient Instructions (Addendum)
If you are not improving in 48 hour then call an I will send you to an eye doctor.  If you develop and increasing eye pain, painful sensitivity to light, or changes in vision then go to the ED.      IF you received an x-ray today, you will receive an invoice from [facility]. Please contact [facility] at [phone] with questions or concerns regarding your invoice.   IF you received labwork today, you will receive an invoice from [facility]. Please contact [facility] at [phone] with questions or concerns regarding your invoice.   Our billing staff will not be able to assist you with questions regarding bills from these companies.  You will be contacted with the lab results as soon as they are available. The fastest way to get your results is to activate your My Chart account. Instructions are located on the last page of this paperwork. If you have not heard from us regarding the results in 2 weeks, please contact this office.

## 2015-09-24 NOTE — Progress Notes (Signed)
09/26/2015 8:34 AM   DOB: 11/15/1965 / MRN: [id number]  SUBJECTIVE:  Eric SereneJames T Din Jr. is a 50 y.o. male with a history of poorly controlled HIV disease presenting for left sided red eye. This started 4 days ago and he complains of copious pus eminating from the eye, and states as of yesterday the problem started occuring in the right eye as well. He denies changes in vision, frank eye pain HA, photophobia and temporal tenderness.     He is allergic to percocet.   He  has a past medical history of HIV infection (HCC) and RLQ abdominal pain.    He  reports that he has never smoked. He has never used smokeless tobacco. He reports that he drinks alcohol. He reports that he does not use illicit drugs. He  reports that he currently engages in sexual activity. The patient  has past surgical history that includes Hernia repair (2009); Hernia repair (02/2010); and Tonsillectomy.  His family history includes Cancer in his father.  Review of Systems  Constitutional: Negative for fever and chills.  Eyes: Negative for blurred vision.  Respiratory: Negative for cough and shortness of breath.   Cardiovascular: Negative for chest pain.  Gastrointestinal: Negative for nausea and abdominal pain.  Genitourinary: Negative for dysuria, urgency and frequency.  Musculoskeletal: Negative for myalgias.  Skin: Negative for rash.  Neurological: Negative for dizziness, tingling and headaches.  Psychiatric/Behavioral: Negative for depression. The patient is not nervous/anxious.     Problem list and medications reviewed and updated by myself where necessary, and exist elsewhere in the encounter.   OBJECTIVE:  BP 136/84 mmHg  Pulse 91  Temp(Src) 98.5 F (36.9 C) (Oral)  Resp 18  Ht 5\' 11"  (1.803 m)  Wt 218 lb (98.884 kg)  BMI 30.42 kg/m2  SpO2 96%  Physical Exam  Constitutional: He is oriented to person, place, and time.  Eyes: Right conjunctiva is injected. Right conjunctiva has no hemorrhage. Left  conjunctiva is injected. Left conjunctiva has no hemorrhage.  Slit lamp exam:      The right eye shows no corneal abrasion, no foreign body and no fluorescein uptake.       The left eye shows no corneal abrasion, no foreign body and no fluorescein uptake.  Cardiovascular: Normal rate and regular rhythm.   Pulmonary/Chest: Effort normal and breath sounds normal.  Musculoskeletal: Normal range of motion.  Neurological: He is alert and oriented to person, place, and time.  Nursing note and vitals reviewed.   No results found for this or any previous visit (from the past 72 hour(s)).  No results found.  Lab Results  Component Value Date   CD4TCELL 10* 08/27/2015   CD4TABS 190* 08/27/2015    ASSESSMENT AND PLAN  [name] was seen today for eye infection.  Diagnoses and all orders for this visit:  Bilateral conjunctivitis: I have spoken to Dr. Margo AyeHall the on call opthamologist at Dr. Laruth BouchardGroat's office and he was very reassuring in that this is likely a simple case of conjunctivitis despite his poorly controlled HIV disease.  I have advised he start drops and he will have a low threshold to RTC or go to the ED if he worsens.      -     trimethoprim-polymyxin b (POLYTRIM) ophthalmic solution; Place 2 drops into both eyes every 4 (four) hours.     The patient was advised to call or return to clinic if he does not see an improvement in symptoms or to seek  the care of the closest emergency department if he worsens with the above plan.   Deliah BostonMichael Ayumi Wangerin, MHS, PA-C [facility] 09/26/2015 8:34 AM

## 2015-10-15 ENCOUNTER — Telehealth: Payer: Self-pay | Admitting: Family Medicine

## 2015-10-15 ENCOUNTER — Ambulatory Visit (INDEPENDENT_AMBULATORY_CARE_PROVIDER_SITE_OTHER): Payer: BLUE CROSS/BLUE SHIELD

## 2015-10-15 ENCOUNTER — Emergency Department (HOSPITAL_COMMUNITY): Payer: BLUE CROSS/BLUE SHIELD

## 2015-10-15 ENCOUNTER — Encounter (HOSPITAL_COMMUNITY): Payer: Self-pay | Admitting: Emergency Medicine

## 2015-10-15 ENCOUNTER — Ambulatory Visit (INDEPENDENT_AMBULATORY_CARE_PROVIDER_SITE_OTHER): Payer: BLUE CROSS/BLUE SHIELD | Admitting: Family Medicine

## 2015-10-15 ENCOUNTER — Emergency Department (HOSPITAL_COMMUNITY)
Admission: EM | Admit: 2015-10-15 | Discharge: 2015-10-15 | Disposition: A | Discharge status: Home / Self Care | Payer: BLUE CROSS/BLUE SHIELD | Attending: Emergency Medicine | Admitting: Emergency Medicine

## 2015-10-15 VITALS — BP 112/90 | HR 86 | Temp 98.6°F | Resp 18 | Ht 70.0 in | Wt 206.6 lb

## 2015-10-15 DIAGNOSIS — B20 Human immunodeficiency virus [HIV] disease: Secondary | ICD-10-CM | POA: Diagnosis not present

## 2015-10-15 DIAGNOSIS — R109 Unspecified abdominal pain: Secondary | ICD-10-CM

## 2015-10-15 DIAGNOSIS — R1032 Left lower quadrant pain: Secondary | ICD-10-CM | POA: Diagnosis not present

## 2015-10-15 DIAGNOSIS — A09 Infectious gastroenteritis and colitis, unspecified: Secondary | ICD-10-CM

## 2015-10-15 DIAGNOSIS — R197 Diarrhea, unspecified: Secondary | ICD-10-CM | POA: Diagnosis not present

## 2015-10-15 DIAGNOSIS — K529 Noninfective gastroenteritis and colitis, unspecified: Secondary | ICD-10-CM | POA: Insufficient documentation

## 2015-10-15 DIAGNOSIS — E860 Dehydration: Secondary | ICD-10-CM | POA: Diagnosis not present

## 2015-10-15 DIAGNOSIS — K439 Ventral hernia without obstruction or gangrene: Secondary | ICD-10-CM | POA: Diagnosis not present

## 2015-10-15 DIAGNOSIS — R1084 Generalized abdominal pain: Secondary | ICD-10-CM | POA: Diagnosis not present

## 2015-10-15 LAB — URINE MICROSCOPIC-ADD ON

## 2015-10-15 LAB — COMPREHENSIVE METABOLIC PANEL
ALBUMIN: 4.1 g/dL (ref 3.5–5.0)
ALK PHOS: 32 U/L — AB (ref 38–126)
ALT: 22 U/L (ref 17–63)
ANION GAP: 9 (ref 5–15)
AST: 19 U/L (ref 15–41)
BUN: 11 mg/dL (ref 6–20)
CALCIUM: 9.6 mg/dL (ref 8.9–10.3)
CHLORIDE: 100 mmol/L — AB (ref 101–111)
CO2: 26 mmol/L (ref 22–32)
Creatinine, Ser: 0.93 mg/dL (ref 0.61–1.24)
GFR calc non Af Amer: >60 mL/min (ref >60)
GLUCOSE: 99 mg/dL (ref 65–99)
POTASSIUM: 3.3 mmol/L — AB (ref 3.5–5.1)
SODIUM: 135 mmol/L (ref 135–145)
Total Bilirubin: 0.7 mg/dL (ref 0.3–1.2)
Total Protein: 7.7 g/dL (ref 6.5–8.1)

## 2015-10-15 LAB — CBC
HEMATOCRIT: 44.2 % (ref 39.0–52.0)
HEMOGLOBIN: 15 g/dL (ref 13.0–17.0)
MCH: 29.1 pg (ref 26.0–34.0)
MCHC: 33.9 g/dL (ref 30.0–36.0)
MCV: 85.7 fL (ref 78.0–100.0)
Platelets: 339 10*3/uL (ref 150–400)
RBC: 5.16 MIL/uL (ref 4.22–5.81)
RDW: 13.7 % (ref 11.5–15.5)
WBC: 11.2 10*3/uL — ABNORMAL HIGH (ref 4.0–10.5)

## 2015-10-15 LAB — POCT CBC
GRANULOCYTE PERCENT: 81.6 % — AB (ref 37–80)
HEMATOCRIT: 47.2 % (ref 43.5–53.7)
HEMOGLOBIN: 16.1 g/dL (ref 14.1–18.1)
Lymph, poc: 1.6 (ref 0.6–3.4)
MCH: 28.7 pg (ref 27–31.2)
MCHC: 34.1 g/dL (ref 31.8–35.4)
MCV: 84.3 fL (ref 80–97)
MID (cbc): 0.3 (ref 0–0.9)
MPV: 6.9 fL (ref 0–99.8)
PLATELET COUNT, POC: 343 10*3/uL (ref 142–424)
POC GRANULOCYTE: 8.4 — AB (ref 2–6.9)
POC LYMPH PERCENT: 15.9 %L (ref 10–50)
POC MID %: 2.5 %M (ref 0–12)
RBC: 5.59 M/uL (ref 4.69–6.13)
RDW, POC: 14.1 %
WBC: 10.3 10*3/uL — AB (ref 4.6–10.2)

## 2015-10-15 LAB — URINALYSIS, ROUTINE W REFLEX MICROSCOPIC
BILIRUBIN URINE: NEGATIVE
Glucose, UA: NEGATIVE mg/dL
Ketones, ur: 40 mg/dL — AB
Leukocytes, UA: NEGATIVE
Nitrite: NEGATIVE
PH: 6 (ref 5.0–8.0)
Protein, ur: NEGATIVE mg/dL

## 2015-10-15 LAB — POC MICROSCOPIC URINALYSIS (UMFC): MUCUS RE: ABSENT

## 2015-10-15 LAB — POCT URINALYSIS DIP (MANUAL ENTRY)
Glucose, UA: NEGATIVE
Leukocytes, UA: NEGATIVE
Nitrite, UA: NEGATIVE
PH UA: 6
Urobilinogen, UA: 1

## 2015-10-15 LAB — LIPASE, BLOOD: LIPASE: 13 U/L (ref 11–51)

## 2015-10-15 MED ORDER — SODIUM CHLORIDE 0.9 % IV BOLUS (SEPSIS)
500.0000 mL | Freq: Once | INTRAVENOUS | Status: AC
  Administered 2015-10-15: 500 mL via INTRAVENOUS

## 2015-10-15 MED ORDER — SODIUM CHLORIDE 0.9 % IV BOLUS (SEPSIS)
1000.0000 mL | Freq: Once | INTRAVENOUS | Status: AC
  Administered 2015-10-15: 1000 mL via INTRAVENOUS

## 2015-10-15 MED ORDER — DICYCLOMINE HCL 10 MG PO CAPS
20.0000 mg | ORAL_CAPSULE | Freq: Once | ORAL | Status: AC
  Administered 2015-10-15: 20 mg via ORAL
  Filled 2015-10-15: qty 2

## 2015-10-15 MED ORDER — IOPAMIDOL (ISOVUE-300) INJECTION 61%
INTRAVENOUS | Status: AC
  Administered 2015-10-15: 100 mL
  Filled 2015-10-15: qty 100

## 2015-10-15 NOTE — ED Notes (Signed)
Pt states he still does not feel good enough to go home, talked with PA who talked with patient about his discharge, patient states he does not agree with how the PA who previously took care of him talked to him, instructed patient he has been medically cleared by the physician and addressed his concerns prior to discharge. Charge nurse notified.

## 2015-10-15 NOTE — ED Notes (Signed)
Pt tolerating po fluids

## 2015-10-15 NOTE — ED Notes (Signed)
Patient states that he does not feel good enough to go home, er md notified, will continue to monitor

## 2015-10-15 NOTE — ED Notes (Signed)
Pt states that he cannot void at this time.

## 2015-10-15 NOTE — Telephone Encounter (Signed)
Pt called to inform us that [facility] for [facility] and he is still in the lobby with out any fluids given he has been there sine 1:30 I advised Dr Katrinka BlazingSmith what the patient said and she states that he will call th charge nurse on duty i called pt to let him know what dr Katrinka Blazingsmith ws going to give the nurse a call

## 2015-10-15 NOTE — Discharge Instructions (Signed)
There does not appear to be an emergent cause for your symptoms at this time. Your exam, labs are reassuring. Your CT scan showed no obstruction. Is important for you to stay well hydrated and drink lots of water, Gatorade or other electrolyte about solution as we discussed. He may take Gas-X to help with the gas in your intestines. Follow-up with your doctor next week for reevaluation. Return to ED for new or worsening symptoms as we discussed.

## 2015-10-15 NOTE — Progress Notes (Signed)
Subjective:    Patient ID: Eric SereneJames T Margolis Jr., male    DOB: 04/14/1965, 50 y.o.   MRN: [id number]  10/15/2015  Abdominal Pain and Diarrhea   HPI This 50 y.o. male presents for evaluation of diarrhea, abdominal pain.  Recent follow-up with Dr. Drue SecondSnider of ID to reestablish care for HIV and to restart medication; stopped medication in 2016-2017; was not ready to treat HIV.  Recently broke up four year relationship with HIV+male.  Labs obtained on 08/27/2015; started Stribild.  HIV RNA Quant 62,000; CD4 count 190 on 6/1; RPR reactive 1:1 titer.  Similar symptoms in the past.  Came on differently this time; got hot 48 hours ago.  Had a biscuit and muscle milk. Went home; went to bed; stomach started cramping; yesterday started cramping as well.  Diarrhea started yesterday; did have one episode two days ago; small amount.  Pressure builds up in stomach and gets bigger and bigger.  Vomited this morning with relief in pressure; felt much better. Took antacids with some relief.  History of dehydration easily.  Does not want to go to hospital. Last two times, admitted.  Symptoms much worse last time. 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  No diarrhea yesterday.  Watery diarrhea this morning; non-bloody; dark stools; almost black but so runny.  No ulcer history.  Self employed.  No sick contacts.  No travel.  No antibiotic use lately.  No previous colonoscopy.  Vomit this morning food contents; mostly liquid. Minimal food for three days.  Trying to drink; water, tea; 2 gallons in three days.  No abdominal burning.  +belching a lot; this is new.  Lots of hiccups.  Decent flatus.     Review of Systems  Constitutional: Negative for fever, chills, diaphoresis, activity change, appetite change and fatigue.  Respiratory: Negative for cough and shortness of breath.   Cardiovascular: Negative for chest pain, palpitations and leg swelling.  Gastrointestinal: Positive for nausea,  abdominal pain, diarrhea and abdominal distention. Negative for vomiting, constipation, blood in stool, anal bleeding and rectal pain.  Endocrine: Negative for cold intolerance, heat intolerance, polydipsia, polyphagia and polyuria.  Skin: Negative for color change, rash and wound.  Neurological: Negative for dizziness, tremors, seizures, syncope, facial asymmetry, speech difficulty, weakness, light-headedness, numbness and headaches.  Psychiatric/Behavioral: Negative for sleep disturbance and dysphoric mood. The patient is not nervous/anxious.     Past Medical History  Diagnosis Date  . HIV infection (HCC)   . RLQ abdominal pain    Past Surgical History  Procedure Laterality Date  . Hernia repair  2009    Open primary repair Dr. Lindie SpruceWyatt  . Hernia repair  02/2010    Lap VWH  repair with mesh  . Tonsillectomy     Allergies  Allergen Reactions  . Percocet [Oxycodone-Acetaminophen] Itching and Other (See Comments)    Extreme agitation   Current Outpatient Prescriptions  Medication Sig Dispense Refill  . elvitegravir-cobicistat-emtricitabine-tenofovir (STRIBILD) 150-150-200-300 MG TABS tablet Take 1 tablet by mouth daily with breakfast. 30 tablet 11  . trimethoprim-polymyxin b (POLYTRIM) ophthalmic solution Place 2 drops into both eyes every 4 (four) hours. 10 mL 0   No current facility-administered medications for this visit.   Social History   Social History  . Marital Status: Single    Spouse Name: N/A  . Number of Children: N/A  . Years of Education: N/A   Occupational History  . Not on file.   Social History Main Topics  .  Smoking status: Never Smoker   . Smokeless tobacco: Never Used  . Alcohol Use: 0.0 oz/week    0 Standard drinks or equivalent per week     Comment: social  . Drug Use: No  . Sexual Activity: Yes     Comment: decined condoms   Other Topics Concern  . Not on file   Social History Narrative   Family History  Problem Relation Age of Onset  .  Cancer Father        Objective:    BP 112/90 mmHg  Pulse 86  Temp(Src) 98.6 F (37 C) (Oral)  Resp 18  Ht 5\' 10"  (1.778 m)  Wt 206 lb 9.6 oz (93.713 kg)  BMI 29.64 kg/m2  SpO2 97% Physical Exam  Constitutional: He is oriented to person, place, and time. He appears well-developed and well-nourished. No distress.  HENT:  Head: Normocephalic and atraumatic.  Right Ear: External ear normal.  Left Ear: External ear normal.  Nose: Nose normal.  Mouth/Throat: Oropharynx is clear and moist.  Eyes: Conjunctivae and EOM are normal. Pupils are equal, round, and reactive to light.  Neck: Normal range of motion. Neck supple. Carotid bruit is not present. No thyromegaly present.  Cardiovascular: Normal rate, regular rhythm, normal heart sounds and intact distal pulses.  Exam reveals no gallop and no friction rub.   No murmur heard. Pulmonary/Chest: Effort normal and breath sounds normal. He has no wheezes. He has no rales.  Abdominal: Soft. He exhibits no distension and no mass. Bowel sounds are increased. There is tenderness in the left upper quadrant and left lower quadrant. There is no rigidity, no rebound, no guarding and negative Murphy's sign. No hernia.  Lymphadenopathy:    He has no cervical adenopathy.  Neurological: He is alert and oriented to person, place, and time. No cranial nerve deficit.  Skin: Skin is warm and dry. No rash noted. He is not diaphoretic.  Psychiatric: He has a normal mood and affect. His behavior is normal.  Nursing note and vitals reviewed.  Results for orders placed or performed in visit on 10/15/15  POCT CBC  Result Value Ref Range   WBC 10.3 (A) 4.6 - 10.2 K/uL   Lymph, poc 1.6 0.6 - 3.4   POC LYMPH PERCENT 15.9 10 - 50 %L   MID (cbc) 0.3 0 - 0.9   POC MID % 2.5 0 - 12 %M   POC Granulocyte 8.4 (A) 2 - 6.9   Granulocyte percent 81.6 (A) 37 - 80 %G   RBC 5.59 4.69 - 6.13 M/uL   Hemoglobin 16.1 14.1 - 18.1 g/dL   HCT, POC 16.1 09.6 - 53.7 %   MCV  84.3 80 - 97 fL   MCH, POC 28.7 27 - 31.2 pg   MCHC 34.1 31.8 - 35.4 g/dL   RDW, POC 04.5 %   Platelet Count, POC 343 142 - 424 K/uL   MPV 6.9 0 - 99.8 fL  POCT urinalysis dipstick  Result Value Ref Range   Color, UA red (A) yellow   Clarity, UA clear clear   Glucose, UA negative negative   Bilirubin, UA moderate (A) negative   Ketones, POC UA large (80) (A) negative   Spec Grav, UA >=1.030    Blood, UA small (A) negative   pH, UA 6.0    Protein Ur, POC =30 (A) negative   Urobilinogen, UA 1.0    Nitrite, UA Negative Negative   Leukocytes, UA Negative Negative  POCT Microscopic  Urinalysis ([facility])  Result Value Ref Range   WBC,UR,HPF,POC None None WBC/hpf   RBC,UR,HPF,POC Few (A) None RBC/hpf   Bacteria None None, Too numerous to count   Mucus Absent Absent   Epithelial Cells, UR Per Microscopy Few (A) None, Too numerous to count cells/hpf   Calcium Oxalate present    Dg Abd Acute W/chest  10/15/2015  CLINICAL DATA:  Abdominal pain.  Diarrhea and vomiting. EXAM: DG ABDOMEN ACUTE W/ 1V CHEST COMPARISON:  09/04/2014.  CT 03/15/2014. FINDINGS: Mediastinum hilar structures normal. Lungs are clear. Heart size normal. No pleural effusion or pneumothorax. No acute bony abnormality. Multiple distended loops of small bowel a paucity of intra colonic air noted. These findings are suggestive of small-bowel obstruction. Small-bowel fold thickening is noted suggesting inflammatory or ischemic change. IV contrast-enhanced chest CT may prove useful for further evaluation. IMPRESSION: 1. Multiple dilated loops of small bowel with paucity of intra colonic gas. These findings are 6-tip of small-bowel obstruction. Associated small bowel wall thickening is noted. This may be from inflammatory or ischemic change. IV contrast-enhanced CT of the abdomen and pelvis may prove useful for further evaluation. 2.  No acute cardiopulmonary disease. Electronically Signed   By: Maisie Fus  Register   On: 10/15/2015 12:24       Assessment & Plan:   1. Abdominal pain, LLQ   2. Diarrhea of infectious origin   3. Human immunodeficiency virus (HIV) disease (HCC)   4. Dehydration    -New. -To ED for hydration, CT abd/pelvis to evaluate for evolving SBO. -remain NPO en route to ED.   Orders Placed This Encounter  Procedures  . Stool culture  . Ova and parasite examination  . Clostridium Difficile by PCR    Order Specific Question:  Is your patient experiencing loose or watery stools (3 or more in 24 hours)?    Answer:  Yes    Order Specific Question:  Has the patient received laxatives in the last 24 hours?    Answer:  No    Order Specific Question:  Has a negative Cdiff test resulted in the last 7 days?    Answer:  No  . DG Abd Acute W/Chest    Standing Status: Future     Number of Occurrences: 1     Standing Expiration Date: 10/14/2016    Order Specific Question:  Reason for exam:    Answer:  abdominal pain and distention, diarrhea, vomiting    Order Specific Question:  Preferred imaging location?    Answer:  External  . Comprehensive metabolic panel  . Amylase  . Lipase  . RPR  . Gastrointestinal Pathogen Panel PCR  . Orthostatic vital signs  . POCT CBC  . POCT urinalysis dipstick  . POCT Microscopic Urinalysis ([facility])   No orders of the defined types were placed in this encounter.    No Follow-up on file.    Tristin Vandeusen Paulita Fujita, M.D. [facility] [address] [place], [place]  [address] [phone] phone [phone] fax

## 2015-10-15 NOTE — ED Notes (Signed)
Pt sts here for generalized abd pain and "thinks he has air in intestines" with hx of same; pt sts pain x 4 days

## 2015-10-15 NOTE — ED Provider Notes (Signed)
CSN: [id number]     Arrival date & time 10/15/15  1420 History   First MD Initiated Contact with Patient 10/15/15 1745     Chief Complaint  Patient presents with  . Abdominal Pain     (Consider location/radiation/quality/duration/timing/severity/associated sxs/prior Treatment) HPI history of HIV, hernia repair, comes in for evaluation of abdominal pain. Patient reports diffuse abdominal pain "that is migrating all over", characterized as sharp, ongoing over the past 2 days. He reports nausea with one episode of emesis, 2x diarrhea. Denies any hematochezia or melena. Denies any urinary symptoms, rectal pain, fevers or chills, cough, shortness of breath or chest pain. Nothing makes the problem better or worse. Patient versus not eaten or had anything to drink in the last 48 hours.  Past Medical History  Diagnosis Date  . HIV infection (HCC)   . RLQ abdominal pain    Past Surgical History  Procedure Laterality Date  . Hernia repair  2009    Open primary repair Dr. Lindie Spruce  . Hernia repair  02/2010    Lap VWH  repair with mesh  . Tonsillectomy     Family History  Problem Relation Age of Onset  . Cancer Father    Social History  Substance Use Topics  . Smoking status: Never Smoker   . Smokeless tobacco: Never Used  . Alcohol Use: 0.0 oz/week    0 Standard drinks or equivalent per week     Comment: social    Review of Systems A 10 point review of systems was completed and was negative except for pertinent positives and negatives as mentioned in the history of present illness     Allergies  Percocet  Home Medications   Prior to Admission medications   Medication Sig Start Date End Date Taking? Authorizing Provider  elvitegravir-cobicistat-emtricitabine-tenofovir (STRIBILD) 150-150-200-300 MG TABS tablet Take 1 tablet by mouth daily with breakfast. 08/27/15  Yes Judyann Munson, MD  trimethoprim-polymyxin b (POLYTRIM) ophthalmic solution Place 2 drops into both eyes every 4  (four) hours. 09/24/15  Yes Ofilia Neas, PA-C   BP 132/93 mmHg  Pulse 83  Temp(Src) 98.1 F (36.7 C) (Oral)  Resp 18  SpO2 100% Physical Exam  Constitutional: He is oriented to person, place, and time. He appears well-developed and well-nourished.  HENT:  Head: Normocephalic and atraumatic.  Mouth/Throat: Oropharynx is clear and moist.  Eyes: Conjunctivae are normal. Pupils are equal, round, and reactive to light. Right eye exhibits no discharge. Left eye exhibits no discharge. No scleral icterus.  Neck: Neck supple.  Cardiovascular: Normal rate, regular rhythm and normal heart sounds.   Pulmonary/Chest: Effort normal and breath sounds normal. No respiratory distress. He has no wheezes. He has no rales.  Abdominal: Soft. There is no tenderness.  Musculoskeletal: He exhibits no tenderness.  Neurological: He is alert and oriented to person, place, and time.  Cranial Nerves II-XII grossly intact  Skin: Skin is warm and dry. No rash noted.  Psychiatric: He has a normal mood and affect.  Nursing note and vitals reviewed.   ED Course  Procedures (including critical care time) Labs Review Labs Reviewed  COMPREHENSIVE METABOLIC PANEL - Abnormal; Notable for the following:    Potassium 3.3 (*)    Chloride 100 (*)    Alkaline Phosphatase 32 (*)    All other components within normal limits  CBC - Abnormal; Notable for the following:    WBC 11.2 (*)    All other components within normal limits  LIPASE, BLOOD  URINALYSIS, ROUTINE W REFLEX MICROSCOPIC (NOT AT [facility])    Imaging Review Dg Abd Acute W/chest  10/15/2015  CLINICAL DATA:  Abdominal pain.  Diarrhea and vomiting. EXAM: DG ABDOMEN ACUTE W/ 1V CHEST COMPARISON:  09/04/2014.  CT 03/15/2014. FINDINGS: Mediastinum hilar structures normal. Lungs are clear. Heart size normal. No pleural effusion or pneumothorax. No acute bony abnormality. Multiple distended loops of small bowel a paucity of intra colonic air noted. These findings  are suggestive of small-bowel obstruction. Small-bowel fold thickening is noted suggesting inflammatory or ischemic change. IV contrast-enhanced chest CT may prove useful for further evaluation. IMPRESSION: 1. Multiple dilated loops of small bowel with paucity of intra colonic gas. These findings are 6-tip of small-bowel obstruction. Associated small bowel wall thickening is noted. This may be from inflammatory or ischemic change. IV contrast-enhanced CT of the abdomen and pelvis may prove useful for further evaluation. 2.  No acute cardiopulmonary disease. Electronically Signed   By: Maisie Fushomas  Register   On: 10/15/2015 12:24   I have personally reviewed and evaluated these images and lab results as part of my medical decision-making.   EKG Interpretation None       MDM  Patient with history of HIV, here for evaluation of 2 day history of abdominal discomfort with nausea and vomiting, diarrhea. Since by PCP for fluid rehydration and CT scan to rule out SBO. Vital signs are stable, afebrile. Screening labs are reassuring. Very mild leukocytosis at 11.2. Pending CT scan.  CT scan without evidence of SBO. Findings may be due to infectious versus inflammatory small bowel enteritis. Discussed symptomatic support at home, aggressive oral fluid rehydration. Follow-up with PCP in next 2-3 days. Patient verbalizes understanding, agrees with plan and subsequent discharge. Prior to patient discharge, I discussed and reviewed this case with Dr.Pickering    Final diagnoses:  Abdominal discomfort  Enteritis       Joycie PeekBenjamin Fawn Desrocher, PA-C 10/20/15 0602  Benjiman CoreNathan Pickering, MD 10/21/15 [phone]

## 2015-10-15 NOTE — Patient Instructions (Addendum)
1.  [facility] for further evaluation including iv fluids, CT scan.  Do not eat or drink anything.    IF you received an x-ray today, you will receive an invoice from [facility]. Please contact [facility] at [phone] with questions or concerns regarding your invoice.   IF you received labwork today, you will receive an invoice from [facility]. Please contact [facility] at [phone] with questions or concerns regarding your invoice.   Our billing staff will not be able to assist you with questions regarding bills from these companies.  You will be contacted with the lab results as soon as they are available. The fastest way to get your results is to activate your My Chart account. Instructions are located on the last page of this paperwork. If you have not heard from us regarding the results in 2 weeks, please contact this office.

## 2015-10-16 ENCOUNTER — Telehealth: Payer: Self-pay | Admitting: Emergency Medicine

## 2015-10-16 ENCOUNTER — Telehealth: Payer: Self-pay

## 2015-10-16 DIAGNOSIS — B20 Human immunodeficiency virus [HIV] disease: Secondary | ICD-10-CM | POA: Diagnosis not present

## 2015-10-16 DIAGNOSIS — A09 Infectious gastroenteritis and colitis, unspecified: Secondary | ICD-10-CM | POA: Diagnosis not present

## 2015-10-16 LAB — COMPREHENSIVE METABOLIC PANEL
ALBUMIN: 4.3 g/dL (ref 3.6–5.1)
ALT: 19 U/L (ref 9–46)
AST: 17 U/L (ref 10–40)
Alkaline Phosphatase: 32 U/L — ABNORMAL LOW (ref 40–115)
BILIRUBIN TOTAL: 0.5 mg/dL (ref 0.2–1.2)
BUN: 11 mg/dL (ref 7–25)
CO2: 26 mmol/L (ref 20–31)
CREATININE: 0.84 mg/dL (ref 0.60–1.35)
Calcium: 9.7 mg/dL (ref 8.6–10.3)
Chloride: 96 mmol/L — ABNORMAL LOW (ref 98–110)
Glucose, Bld: 96 mg/dL (ref 65–99)
Potassium: 3.8 mmol/L (ref 3.5–5.3)
SODIUM: 138 mmol/L (ref 135–146)
Total Protein: 7.8 g/dL (ref 6.1–8.1)

## 2015-10-16 LAB — OVA AND PARASITE EXAMINATION: OP: NONE SEEN

## 2015-10-16 LAB — RPR

## 2015-10-16 LAB — LIPASE: LIPASE: 6 U/L — AB (ref 7–60)

## 2015-10-16 LAB — AMYLASE: Amylase: 14 U/L (ref 0–105)

## 2015-10-16 NOTE — Telephone Encounter (Signed)
[facility] sent patient home and refused his admission.     His stomach is worse today.  Dr. Katrinka BlazingSmith   Looking for the results from his tests.   May have a virsus - he was ripped out two wet ceilings which may contributed to his illness.  This has happen before - twice he had two GI Tubes - he has air in his system.    Very careful about what he eats.    [phone]  [phone] mother (okay to talk to her - on file)

## 2015-10-16 NOTE — Telephone Encounter (Signed)
Spoke to pt regarding worsening stomach issues  Pt requesting lab results per OV with Dr. Katrinka BlazingSmith 7/20 Informed pt, we will notify him when results are posted, usually 5-7 business days Pt does not have My Chart Denies vomiting, fever or diarrhea at this time. [facility], and didn't like service CT scan negative for SBO, Lipase wnl Informed pt Stool culture, Ova/parasites test in process. Pt states, if abdominal pain worsens, he will go to ER again.

## 2015-10-17 ENCOUNTER — Encounter: Payer: Self-pay | Admitting: Family Medicine

## 2015-10-17 ENCOUNTER — Ambulatory Visit (INDEPENDENT_AMBULATORY_CARE_PROVIDER_SITE_OTHER): Payer: BLUE CROSS/BLUE SHIELD | Admitting: Family Medicine

## 2015-10-17 VITALS — BP 122/80 | HR 113 | Temp 98.4°F | Resp 16 | Ht 70.0 in | Wt 206.0 lb

## 2015-10-17 DIAGNOSIS — E860 Dehydration: Secondary | ICD-10-CM

## 2015-10-17 DIAGNOSIS — K529 Noninfective gastroenteritis and colitis, unspecified: Secondary | ICD-10-CM | POA: Diagnosis not present

## 2015-10-17 LAB — POCT URINALYSIS DIP (MANUAL ENTRY)
BILIRUBIN UA: NEGATIVE
GLUCOSE UA: NEGATIVE
NITRITE UA: NEGATIVE
Protein Ur, POC: NEGATIVE
Spec Grav, UA: 1.015
Urobilinogen, UA: 0.2
pH, UA: 7

## 2015-10-17 LAB — POCT CBC
Granulocyte percent: 84.5 %G — AB (ref 37–80)
HCT, POC: 42.3 % — AB (ref 43.5–53.7)
Hemoglobin: 15.1 g/dL (ref 14.1–18.1)
LYMPH, POC: 1.7 (ref 0.6–3.4)
MCH, POC: 29.6 pg (ref 27–31.2)
MCHC: 35.7 g/dL — AB (ref 31.8–35.4)
MCV: 82.9 fL (ref 80–97)
MID (CBC): 0.4 (ref 0–0.9)
MPV: 6.7 fL (ref 0–99.8)
PLATELET COUNT, POC: 310 10*3/uL (ref 142–424)
POC Granulocyte: 11.3 — AB (ref 2–6.9)
POC LYMPH %: 12.5 % (ref 10–50)
POC MID %: 3 % (ref 0–12)
RBC: 5.1 M/uL (ref 4.69–6.13)
RDW, POC: 13.4 %
WBC: 13.4 10*3/uL — AB (ref 4.6–10.2)

## 2015-10-17 LAB — COMPREHENSIVE METABOLIC PANEL
ALK PHOS: 31 U/L — AB (ref 40–115)
ALT: 16 U/L (ref 9–46)
AST: 13 U/L (ref 10–40)
Albumin: 4.2 g/dL (ref 3.6–5.1)
BILIRUBIN TOTAL: 0.8 mg/dL (ref 0.2–1.2)
BUN: 9 mg/dL (ref 7–25)
CALCIUM: 9.3 mg/dL (ref 8.6–10.3)
CO2: 30 mmol/L (ref 20–31)
Chloride: 96 mmol/L — ABNORMAL LOW (ref 98–110)
Creat: 0.84 mg/dL (ref 0.60–1.35)
Glucose, Bld: 93 mg/dL (ref 65–99)
POTASSIUM: 3.7 mmol/L (ref 3.5–5.3)
Sodium: 136 mmol/L (ref 135–146)
TOTAL PROTEIN: 7.1 g/dL (ref 6.1–8.1)

## 2015-10-17 LAB — POC MICROSCOPIC URINALYSIS (UMFC)

## 2015-10-17 LAB — CLOSTRIDIUM DIFFICILE BY PCR: Toxigenic C. Difficile by PCR: NOT DETECTED

## 2015-10-17 MED ORDER — CIPROFLOXACIN HCL 500 MG PO TABS: 500.0000 mg | ORAL_TABLET | Freq: Two times a day (BID) | ORAL | Status: DC

## 2015-10-17 MED ORDER — ONDANSETRON 8 MG PO TBDP: 8.0000 mg | ORAL_TABLET | Freq: Three times a day (TID) | ORAL | Status: DC | PRN

## 2015-10-17 MED ORDER — ONDANSETRON 4 MG PO TBDP
8.0000 mg | ORAL_TABLET | Freq: Once | ORAL | Status: AC
  Administered 2015-10-17: 8 mg via ORAL

## 2015-10-17 NOTE — Patient Instructions (Addendum)
IF you received an x-ray today, you will receive an invoice from [facility]. Please contact [facility] at [phone] with questions or concerns regarding your invoice.   IF you received labwork today, you will receive an invoice from [employer]. Please contact [facility] at [phone] with questions or concerns regarding your invoice.   Our billing staff will not be able to assist you with questions regarding bills from these companies.  You will be contacted with the lab results as soon as they are available. The fastest way to get your results is to activate your My Chart account. Instructions are located on the last page of this paperwork. If you have not heard from [place] regarding the results in 2 weeks, please contact this office.    Full Liquid Diet A full liquid diet may be used:   To help you transition from a clear liquid diet to a soft diet.   When your body is healing and can only tolerate foods that are easy to digest.  Before or after certain a procedure, test, or surgery (such as stomach or intestinal surgeries).   If you have trouble swallowing or chewing.  A full liquid diet includes fluids and foods that are liquid or will become liquid at room temperature. The full liquid diet gives you the proteins, fluids, salts, and minerals that you need for energy. If you continue this diet for more than 72 hours, talk to your health care provider about how many calories you need to consume. If you continue the diet for more than 5 days, talk to your health care provider about taking a multivitamin or a nutritional supplement. WHAT DO I NEED TO KNOW ABOUT A FULL LIQUID DIET?  You may have any liquid.  You may have any food that becomes a liquid at room temperature. The food is considered a liquid if it can be poured off a spoon at room temperature.  Drink one serving of citrus or vitamin C-enriched fruit juice daily. WHAT  FOODS CAN I EAT? Grains Any grain food that can be pureed in soup (such as crackers, pasta, and rice). Hot cereal (such as farina or oatmeal) that has been blended. Talk to your health care provider or dietitian about these foods. Vegetables Pulp-free tomato or vegetable juice. Vegetables pureed in soup.  Fruits Fruit juice, including nectars and juices with pulp. Meats and Other Protein Sources Eggs in custard, eggnog mix, and eggs used in ice cream or pudding. Strained meats, like in baby food, may be allowed. Consult your health care provider.  Dairy Milk and milk-based beverages, including milk shakes and instant breakfast mixes. Smooth yogurt. Pureed cottage cheese. Avoid these foods if they are not well tolerated. Beverages All beverages, including liquid nutritional supplements. Ask your health care provider if you can have carbonated beverages. They may not be well tolerated. Condiments Iodized salt, pepper, spices, and flavorings. Cocoa powder. Vinegar, ketchup, yellow mustard, smooth sauces (such as hollandaise, cheese sauce, or white sauce), and soy sauce. Sweets and Desserts Custard, smooth pudding. Flavored gelatin. Tapioca, junket. Plain ice cream, sherbet, fruit ices. Frozen ice pops, frozen fudge pops, pudding pops, and other frozen bars with cream. Syrups, including chocolate syrup. Sugar, honey, jelly.  Fats and Oils Margarine, butter, cream, sour cream, and oils. Other Broth and cream soups. Strained, broth-based soups. The items listed above may not be a complete list of recommended foods or beverages. Contact your dietitian for more options.  WHAT FOODS  CAN I NOT EAT? Grains All breads. Grains are not allowed unless they are pureed into soup. Vegetables Vegetables are not allowed unless they are juiced, or cooked and pureed into soup. Fruits Fruits are not allowed unless they are juiced. Meats and Other Protein Sources Any meat or fish. Cooked or raw eggs. Nut  butters.  Dairy Cheese.  Condiments Stone ground mustards. Fats and Oils Fats that are coarse or chunky. Sweets and Desserts Ice cream or other frozen desserts that have any solids in them or on top, such as nuts, chocolate chips, and pieces of cookies. Cakes. Cookies. Candy. Others Soups with chunks or pieces in them. The items listed above may not be a complete list of foods and beverages to avoid. Contact your dietitian for more information.   This information is not intended to replace advice given to you by your health care provider. Make sure you discuss any questions you have with your health care provider.   Document Released: 03/14/2005 Document Revised: 03/19/2013 Document Reviewed: 01/17/2013 Elsevier Interactive Patient Education 2016 Elsevier Inc.  Small Bowel Obstruction A small bowel obstruction is a blockage in the small bowel. The small bowel, which is also called the small intestine, is a long, slender tube that connects the stomach to the colon. When a person eats and drinks, food and fluids go from the stomach to the small bowel. This is where most of the nutrients in the food and fluids are absorbed. A small bowel obstruction will prevent food and fluids from passing through the small bowel as they normally do during digestion. The small bowel can become partially or completely blocked. This can cause symptoms such as abdominal pain, vomiting, and bloating. If this condition is not treated, it can be dangerous because the small bowel could rupture. CAUSES Common causes of this condition include:  Scar tissue from previous surgery or radiation treatment.  Recent surgery. This may cause the movements of the bowel to slow down and cause food to block the intestine.  Hernias.  Inflammatory bowel disease (colitis).  Twisting of the bowel (volvulus).  Tumors.  A foreign body.  Slipping of a part of the bowel into another part  (intussusception). SYMPTOMS Symptoms of this condition include:  Abdominal pain. This may be dull cramps or sharp pain. It may occur in one area, or it may be present in the entire abdomen. Pain can range from mild to severe, depending on the degree of obstruction.  Nausea and vomiting. Vomit may be greenish or a yellow bile color.  Abdominal bloating.  Constipation.  Lack of passing gas.  Frequent belching.  Diarrhea. This may occur if the obstruction is partial and runny stool is able to leak around the obstruction. DIAGNOSIS This condition may be diagnosed based on a physical exam, medical history, and X-rays of the abdomen. You may also have other tests, such as a CT scan of the abdomen and pelvis. TREATMENT Treatment for this condition depends on the cause and severity of the problem. Treatment options may include:  Bed rest along with fluids and pain medicines that are given through an IV tube inserted into one of your veins. Sometimes, this is all that is needed for the obstruction to improve.  Following a simple diet. In some cases, a clear liquid diet may be required for several days. This allows the bowel to rest.  Placement of a small tube (nasogastric tube) into the stomach. When the bowel is blocked, it usually swells up like a  balloon that is filled with air and fluids. The air and fluids may be removed by suction through the nasogastric tube. This can help with pain, discomfort, and nausea. It can also help the obstruction to clear up faster.  Surgery. This may be required if other treatments do not work. Bowel obstruction from a hernia may require early surgery and can be an emergency procedure. Surgery may also be required for scar tissue that causes frequent or severe obstructions. HOME CARE INSTRUCTIONS  Get plenty of rest.  Follow instructions from your health care provider about eating restrictions. You may need to avoid solid foods and consume only clear liquids  until your condition improves.  Take over-the-counter and prescription medicines only as told by your health care provider.  Keep all follow-up visits as told by your health care provider. This is important. SEEK MEDICAL CARE IF:  You have a fever.  You have chills. SEEK IMMEDIATE MEDICAL CARE IF:  You have increased pain or cramping.  You vomit blood.  You have uncontrolled vomiting or nausea.  You cannot drink fluids because of vomiting or pain.  You develop confusion.  You begin feeling very dry or thirsty (dehydrated).  You have severe bloating.  You feel extremely weak or you faint.   This information is not intended to replace advice given to you by your health care provider. Make sure you discuss any questions you have with your health care provider.   Document Released: 05/31/2005 Document Revised: 12/03/2014 Document Reviewed: 05/08/2014 Elsevier Interactive Patient Education 2016 Elsevier Inc.  Soft-Food Meal Plan A soft-food meal plan includes foods that are safe and easy to swallow. This meal plan typically is used:  If you are having trouble chewing or swallowing foods.  As a transition meal plan after only having had liquid meals for a long period. WHAT DO I NEED TO KNOW ABOUT THE SOFT-FOOD MEAL PLAN? A soft-food meal plan includes tender foods that are soft and easy to chew and swallow. In most cases, bite-sized pieces of food are easier to swallow. A bite-sized piece is about  inch or smaller. Foods in this plan do not need to be ground or pureed. Foods that are very hard, crunchy, or sticky should be avoided. Also, breads, cereals, yogurts, and desserts with nuts, seeds, or fruits should be avoided. WHAT FOODS CAN I EAT? Grains Rice and wild rice. Moist bread, dressing, pasta, and noodles. Well-moistened dry or cooked cereals, such as farina (cooked wheat cereal), oatmeal, or grits. Biscuits, breads, muffins, pancakes, and waffles that have been well  moistened. Vegetables Shredded lettuce. Cooked, tender vegetables, including potatoes without skins. Vegetable juices. Broths or creamed soups made with vegetables that are not stringy or chewy. Strained tomatoes (without seeds). Fruits Canned or well-cooked fruits. Soft (ripe), peeled fresh fruits, such as peaches, nectarines, kiwi, cantaloupe, honeydew melon, and watermelon (without seeds). Soft berries with small seeds, such as strawberries. Fruit juices (without pulp). Meats and Other Protein Sources Moist, tender, lean beef. Mutton. Lamb. Veal. Chicken. [place]. Liver. Ham. Fish without bones. Eggs. Dairy Milk, milk drinks, and cream. Plain cream cheese and cottage cheese. Plain yogurt. Sweets/Desserts Flavored gelatin desserts. Custard. Plain ice cream, frozen yogurt, sherbet, milk shakes, and malts. Plain cakes and cookies. Plain hard candy.  Other Butter, margarine (without trans fat), and cooking oils. Mayonnaise. Cream sauces. Mild spices, salt, and sugar. Syrup, molasses, honey, and jelly. The items listed above may not be a complete list of recommended foods or beverages. Contact your  dietitian for more options. WHAT FOODS ARE NOT RECOMMENDED? Grains Dry bread, toast, crackers that have not been moistened. Coarse or dry cereals, such as bran, granola, and shredded wheat. Tough or chewy crusty breads, such as [place] bread or baguettes. Vegetables Corn. Raw vegetables except shredded lettuce. Cooked vegetables that are tough or stringy. Tough, crisp, fried potatoes and potato skins. Fruits Fresh fruits with skins or seeds or both, such as apples, pears, or grapes. Stringy, high-pulp fruits, such as papaya, pineapple, coconut, or mango. Fruit leather, fruit roll-ups, and all dried fruits. Meats and Other Protein Sources Sausages and hot dogs. Meats with gristle. Fish with bones. Nuts, seeds, and chunky peanut or other nut butters. Sweets/Desserts Cakes or cookies that are very dry or  chewy.  The items listed above may not be a complete list of foods and beverages to avoid. Contact your dietitian for more information.   This information is not intended to replace advice given to you by your health care provider. Make sure you discuss any questions you have with your health care provider.   Document Released: 06/21/2007 Document Revised: 03/19/2013 Document Reviewed: 02/08/2013 Elsevier Interactive Patient Education [employer]

## 2015-10-17 NOTE — Progress Notes (Signed)
Subjective:    Patient ID: Eric Chapman., male    DOB: [birth year], 50 y.o.   MRN: [id number] By signing my name below, I, Eric Chapman, attest that this documentation has been prepared under the direction and in the presence of Eric Sorenson, MD. Electronically Signed: Javier Chapman, ER Scribe. 10/17/2015. 12:29 PM.  Chief Complaint  Patient presents with  . Abdominal Pain    Gas in intestines, pt recently seen in ER  . Dehydration   HPI HPI Comments: [name]. is a 50 y.o. male who presents to [facility] for a follow up for his continuing diarrhea, vomiting, and dehydration which has been ongoing for five days. He denies fever or chills. He is only nauseous when he eats or drinks. He does not have any nausea medication at home. He hasn't eaten in five days.  He was last seen here two days ago. PMH of HIV. He came in with abdominal cramping, diarrhea, and emesis. Urine reflected dehydration and abdominal films showed dilated small bowel loops. Positive intracolonic gas concerning for small bowel obstruction. Pt did have a mild leucytosis and potassium was 3.3. CT scan showed infectious vs inflammatory small bowel enteritis. He also had a tiny right renal cyst and a small ventral hernia. Pt had a hx of SBO x2 requiring NG tubes. Cdiff and GI panel are still pending. Lipase is normal, stool culture pend negative ONP.negative RPR and amylase.   Past Medical History  Diagnosis Date  . HIV infection (HCC)   . RLQ abdominal pain    Allergies  Allergen Reactions  . Percocet [Oxycodone-Acetaminophen] Itching and Other (See Comments)    Extreme agitation   Current Outpatient Prescriptions on File Prior to Visit  Medication Sig Dispense Refill  . elvitegravir-cobicistat-emtricitabine-tenofovir (STRIBILD) 150-150-200-300 MG TABS tablet Take 1 tablet by mouth daily with breakfast. 30 tablet 11   No current facility-administered medications on file prior to visit.    Review of Systems    Constitutional: Positive for fatigue. Negative for fever and chills.  Gastrointestinal: Positive for nausea, vomiting and abdominal pain.  Neurological: Positive for dizziness and light-headedness. Negative for headaches.       Objective:   Physical Exam  Constitutional: He is oriented to person, place, and time. He appears well-developed and well-nourished. No distress.  HENT:  Head: Normocephalic and atraumatic.  Eyes: Pupils are equal, round, and reactive to light.  Neck: Neck supple.  Cardiovascular: Normal rate.   No murmur heard. Tachycardic with regular rate. Bowel sounds heard in chest.  Pulmonary/Chest: Effort normal. No respiratory distress.  Abdominal:  Tympanitic hyperactive bowel sounds. Abdomen distended with tenderness and guarding.  Musculoskeletal: Normal range of motion.  Neurological: He is alert and oriented to person, place, and time. Coordination normal.  Skin: Skin is warm and dry. He is not diaphoretic.  Psychiatric: He has a normal mood and affect. His behavior is normal.  Nursing note and vitals reviewed.  Positive orthostatics.   Results for orders placed or performed in visit on 10/17/15  POCT CBC  Result Value Ref Range   WBC 13.4 (A) 4.6 - 10.2 K/uL   Lymph, poc 1.7 0.6 - 3.4   POC LYMPH PERCENT 12.5 10 - 50 %L   MID (cbc) 0.4 0 - 0.9   POC MID % 3.0 0 - 12 %M   POC Granulocyte 11.3 (A) 2 - 6.9   Granulocyte percent 84.5 (A) 37 - 80 %G   RBC 5.10  4.69 - 6.13 M/uL   Hemoglobin 15.1 14.1 - 18.1 g/dL   HCT, POC 98.1 (A) 19.1 - 53.7 %   MCV 82.9 80 - 97 fL   MCH, POC 29.6 27 - 31.2 pg   MCHC 35.7 (A) 31.8 - 35.4 g/dL   RDW, POC 47.8 %   Platelet Count, POC 310 142 - 424 K/uL   MPV 6.7 0 - 99.8 fL   12:33 PM pt is feeling better with hyperactive bowel sounds.     Assessment & Plan:   1. Gastroenteritis/enteritis   2. Dehydration   Sxs and exam much improved after 3L IVF.  CT shows small bowel enteritis - infectious vs inflammatory - but  will cover for infectious by starting cipro since wbc are increasing. Stool studies - c. Diff, GI pathogens, clx - are still P. Keep to full liquid but can advance to very soft, low fiber if sxs are continuing to improve. Recheck in 2d.  Orders Placed This Encounter  Procedures  . Comprehensive metabolic panel    Order Specific Question:  Has the patient fasted?    Answer:  Yes  . Orthostatic vital signs  . POCT urinalysis dipstick  . POCT Microscopic Urinalysis ([facility])  . POCT CBC  . Insert peripheral IV    Meds ordered this encounter  Medications  . ondansetron (ZOFRAN-ODT) disintegrating tablet 8 mg    Sig:   . ciprofloxacin (CIPRO) 500 MG tablet    Sig: Take 1 tablet (500 mg total) by mouth 2 (two) times daily.    Dispense:  14 tablet    Refill:  0  . ondansetron (ZOFRAN ODT) 8 MG disintegrating tablet    Sig: Take 1 tablet (8 mg total) by mouth every 8 (eight) hours as needed for nausea or vomiting.    Dispense:  20 tablet    Refill:  0    I personally performed the services described in this documentation, which was scribed in my presence. The recorded information has been reviewed and considered, and addended by me as needed.   Eric Chapman, M.D.  [facility] [address] [place], [place] [address] [phone] phone [phone] fax  10/17/2015 9:31 PM

## 2015-10-19 LAB — STOOL CULTURE

## 2015-10-20 ENCOUNTER — Telehealth: Payer: Self-pay | Admitting: Physician Assistant

## 2015-10-20 LAB — GASTROINTESTINAL PATHOGEN PANEL PCR
C. DIFFICILE TOX A/B, PCR: NOT DETECTED
CRYPTOSPORIDIUM, PCR: NOT DETECTED
Campylobacter, PCR: NOT DETECTED
E COLI (ETEC) LT/ST, PCR: NOT DETECTED
E COLI (STEC) STX1/STX2, PCR: NOT DETECTED
E COLI 0157, PCR: NOT DETECTED
Giardia lamblia, PCR: NOT DETECTED
Norovirus, PCR: DETECTED — CR
ROTAVIRUS, PCR: NOT DETECTED
SALMONELLA, PCR: NOT DETECTED
Shigella, PCR: NOT DETECTED

## 2015-10-20 NOTE — Telephone Encounter (Signed)
Critical value lab result call from [facility].  NOROVIRUS POSITIVE

## 2015-10-20 NOTE — Telephone Encounter (Signed)
Called pt. He had never started the cipro so will not fill. He is feeling better.

## 2016-02-23 ENCOUNTER — Ambulatory Visit (INDEPENDENT_AMBULATORY_CARE_PROVIDER_SITE_OTHER): Payer: BLUE CROSS/BLUE SHIELD | Admitting: Family Medicine

## 2016-02-23 VITALS — BP 124/86 | HR 91 | Temp 98.5°F | Resp 16 | Ht 70.5 in | Wt 219.0 lb

## 2016-02-23 DIAGNOSIS — L245 Irritant contact dermatitis due to other chemical products: Secondary | ICD-10-CM

## 2016-02-23 MED ORDER — DIPHENHYDRAMINE HCL 25 MG PO TABS: 25.0000 mg | ORAL_TABLET | Freq: Four times a day (QID) | ORAL | 0 refills | Status: DC | PRN

## 2016-02-23 MED ORDER — CLOTRIMAZOLE-BETAMETHASONE 1-0.05 % EX CREA: 1.0000 "application " | TOPICAL_CREAM | Freq: Two times a day (BID) | CUTANEOUS | 0 refills | Status: DC

## 2016-02-23 NOTE — Patient Instructions (Addendum)
   IF you received an x-ray today, you will receive an invoice from [facility]. Please contact [facility] at [phone] with questions or concerns regarding your invoice.   IF you received labwork today, you will receive an invoice from [facility]. Please contact [facility] at [phone] with questions or concerns regarding your invoice.   Our billing staff will not be able to assist you with questions regarding bills from these companies.  You will be contacted with the lab results as soon as they are available. The fastest way to get your results is to activate your My Chart account. Instructions are located on the last page of this paperwork. If you have not heard from us regarding the results in 2 weeks, please contact this office.      Contact Dermatitis Introduction Dermatitis is redness, soreness, and swelling (inflammation) of the skin. Contact dermatitis is a reaction to certain substances that touch the skin. There are two types of contact dermatitis:  Irritant contact dermatitis. This type is caused by something that irritates your skin, such as dry hands from washing them too much. This type does not require previous exposure to the substance for a reaction to occur. This type is more common.  Allergic contact dermatitis. This type is caused by a substance that you are allergic to, such as a nickel allergy or poison ivy. This type only occurs if you have been exposed to the substance (allergen) before. Upon a repeat exposure, your body reacts to the substance. This type is less common. What are the causes? Many different substances can cause contact dermatitis. Irritant contact dermatitis is most commonly caused by exposure to:  Makeup.  Soaps.  Detergents.  Bleaches.  Acids.  Metal salts, such as nickel. Allergic contact dermatitis is most commonly caused by exposure to:  Poisonous  plants.  Chemicals.  Jewelry.  Latex.  Medicines.  Preservatives in products, such as clothing. What increases the risk? This condition is more likely to develop in:  People who have jobs that expose them to irritants or allergens.  People who have certain medical conditions, such as asthma or eczema. What are the signs or symptoms? Symptoms of this condition may occur anywhere on your body where the irritant has touched you or is touched by you. Symptoms include:  Dryness or flaking.  Redness.  Cracks.  Itching.  Pain or a burning feeling.  Blisters.  Drainage of small amounts of blood or clear fluid from skin cracks. With allergic contact dermatitis, there may also be swelling in areas such as the eyelids, mouth, or genitals. How is this diagnosed? This condition is diagnosed with a medical history and physical exam. A patch skin test may be performed to help determine the cause. If the condition is related to your job, you may need to see an occupational medicine specialist. How is this treated? Treatment for this condition includes figuring out what caused the reaction and protecting your skin from further contact. Treatment may also include:  Steroid creams or ointments. Oral steroid medicines may be needed in more severe cases.  Antibiotics or antibacterial ointments, if a skin infection is present.  Antihistamine lotion or an antihistamine taken by mouth to ease itching.  A bandage (dressing). Follow these instructions at home: Skin Care  Moisturize your skin as needed.  Apply cool compresses to the affected areas.  Try taking a bath with:  Epsom salts. Follow the instructions on the packaging. You can get these at   your local pharmacy or grocery store.  Baking soda. Pour a small amount into the bath as directed by your health care provider.  Colloidal oatmeal. Follow the instructions on the packaging. You can get this at your local pharmacy or grocery  store.  Try applying baking soda paste to your skin. Stir water into baking soda until it reaches a paste-like consistency.  Do not scratch your skin.  Bathe less frequently, such as every other day.  Bathe in lukewarm water. Avoid using hot water. Medicines  Take or apply over-the-counter and prescription medicines only as told by your health care provider.  If you were prescribed an antibiotic medicine, take or apply your antibiotic as told by your health care provider. Do not stop using the antibiotic even if your condition starts to improve. General instructions  Keep all follow-up visits as told by your health care provider. This is important.  Avoid the substance that caused your reaction. If you do not know what caused it, keep a journal to try to track what caused it. Write down:  What you eat.  What cosmetic products you use.  What you drink.  What you wear in the affected area. This includes jewelry.  If you were given a dressing, take care of it as told by your health care provider. This includes when to change and remove it. Contact a health care provider if:  Your condition does not improve with treatment.  Your condition gets worse.  You have signs of infection such as swelling, tenderness, redness, soreness, or warmth in the affected area.  You have a fever.  You have new symptoms. Get help right away if:  You have a severe headache, neck pain, or neck stiffness.  You vomit.  You feel very sleepy.  You notice red streaks coming from the affected area.  Your bone or joint underneath the affected area becomes painful after the skin has healed.  The affected area turns darker.  You have difficulty breathing. This information is not intended to replace advice given to you by your health care provider. Make sure you discuss any questions you have with your health care provider. Document Released: 03/11/2000 Document Revised: 08/20/2015 Document  Reviewed: 07/30/2014  2017 Elsevier  

## 2016-02-23 NOTE — Progress Notes (Signed)
Chief Complaint  Patient presents with  . Rash    right hand    HPI   He has been working with dry wall spackling He reports dry cracking skin that is now red and blistering He has a dry itchy rash on the right hand and between the fingers He tried a little hydrocortisone cream with aloe but it burns like crazy. He reports that he works with his hands.  He reports clear vesicle if she rubs it too much. He denies purulent drainage. 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     Past Medical History:  Diagnosis Date  . HIV infection (HCC)   . RLQ abdominal pain     Current Outpatient Prescriptions  Medication Sig Dispense Refill  . elvitegravir-cobicistat-emtricitabine-tenofovir (STRIBILD) 150-150-200-300 MG TABS tablet Take 1 tablet by mouth daily with breakfast. 30 tablet 11  . clotrimazole-betamethasone (LOTRISONE) cream Apply 1 application topically 2 (two) times daily. 30 g 0  . diphenhydrAMINE (BENADRYL) 25 MG tablet Take 1 tablet (25 mg total) by mouth every 6 (six) hours as needed for itching. 30 tablet 0   No current facility-administered medications for this visit.     Allergies:  Allergies  Allergen Reactions  . Percocet [Oxycodone-Acetaminophen] Itching and Other (See Comments)    Extreme agitation    Past Surgical History:  Procedure Laterality Date  . HERNIA REPAIR  2009   Open primary repair Dr. Lindie SpruceWyatt  . HERNIA REPAIR  02/2010   Lap VWH  repair with mesh  . TONSILLECTOMY      Social History   Social History  . Marital status: Single    Spouse name: N/A  . Number of children: N/A  . Years of education: N/A   Social History Main Topics  . Smoking status: Never Smoker  . Smokeless tobacco: Never Used  . Alcohol use 0.0 oz/week     Comment: social  . Drug use: No  . Sexual activity: Yes     Comment: decined condoms   Other Topics Concern  . None   Social History Narrative  . None     ROS  Objective: Vitals:   02/23/16 1757  BP: 124/86  Pulse: 91  Resp: 16  Temp: 98.5 F (36.9 C)  SpO2: 98%  Weight: 219 lb (99.3 kg)  Height: 5' 10.5" (1.791 m)    Physical Exam  Constitutional: He is oriented to person, place, and time. He appears well-developed and well-nourished.  HENT:  Head: Normocephalic and atraumatic.  Eyes: Conjunctivae and EOM are normal.  Pulmonary/Chest: Effort normal.  Neurological: He is alert and oriented to person, place, and time.  Skin: Skin is warm. Capillary refill takes less than 2 seconds.  Psychiatric: He has a normal mood and affect. His behavior is normal. Judgment and thought content normal.   Rash-  Right hand erythematous, with excoriation, pain visible in the wound worse over the MCP joint Left hand without erythema but scaly rash between the fingers especially between the 3rd and 4th digit   Assessment and Plan [name] was seen today for rash.  Diagnoses and all orders for this visit:  Irritant contact dermatitis due to other chemical products Discussed with patient to use tegaderm to cover the affected areas at work To use luke warm to cool water without soap to wash hands To remove pain use mineral oil instead of solvents Benadryl to prevent itching Avoid neosporin No topical hand sanitizer  Other orders -     clotrimazole-betamethasone (LOTRISONE) cream; Apply 1 application topically 2 (two) times daily. -     diphenhydrAMINE (BENADRYL) 25 MG tablet; Take 1 tablet (25 mg total) by mouth every 6 (six) hours as needed for itching.  -   Zoe A Creta LevinStallings

## 2016-03-01 ENCOUNTER — Ambulatory Visit (INDEPENDENT_AMBULATORY_CARE_PROVIDER_SITE_OTHER): Payer: BLUE CROSS/BLUE SHIELD | Admitting: Family Medicine

## 2016-03-01 VITALS — BP 120/78 | HR 120 | Temp 102.3°F | Ht 70.5 in | Wt 220.0 lb

## 2016-03-01 DIAGNOSIS — E860 Dehydration: Secondary | ICD-10-CM

## 2016-03-01 DIAGNOSIS — R509 Fever, unspecified: Secondary | ICD-10-CM | POA: Diagnosis not present

## 2016-03-01 LAB — POCT INFLUENZA A/B
INFLUENZA A, POC: NEGATIVE
INFLUENZA B, POC: NEGATIVE

## 2016-03-01 NOTE — Progress Notes (Signed)
By signing my name below, I, Mesha Guinyard, attest that this documentation has been prepared under the direction and in the presence of Norberto SorensonEva Shaw, MD.  Electronically Signed: Arvilla MarketMesha Guinyard, Medical Scribe. 03/01/16. 5:54 PM.  Subjective:    Patient ID: Eric SereneJames T Alred Jr., male    DOB: 12/29/1965, 50 y.o.   MRN: [id number]  HPI Chief Complaint  Patient presents with  . Fever    Onset today  . Generalized Body Aches  . Chills    HPI Comments: Eric SereneJames T Dechert Jr. is a 50 y.o. male who presents to the [facility] complaining of HA yesterday. Reports worsening myalgias, chills, fever, and developing sore throat. He has an active job so he gets dehydrated easily. He suspected his myalgias was from his job until he started feeling "funny" around 12:45 pm today (5 hours ago), and his sxs have been worsening since. Denies congestion, nausea, and emesis.  Patient Active Problem List   Diagnosis Date Noted  . Sepsis (HCC) 03/15/2014  . SIRS (systemic inflammatory response syndrome) (HCC) 03/15/2014  . Pyrexia   . HIV (human immunodeficiency virus infection) (HCC)   . Abdominal pain, acute   . Nausea vomiting and diarrhea 03/14/2014  . History of kidney stones 08/12/2011  . Abdominal pain, R sided periumbilical 05/25/2011  . Human immunodeficiency virus (HIV) disease (HCC) 02/10/2006  . 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 ILEUS 02/10/2006   Past Medical History:  Diagnosis Date  . HIV infection (HCC)   . RLQ abdominal pain    Past Surgical History:  Procedure Laterality Date  . HERNIA REPAIR  2009   Open primary repair Dr. Lindie SpruceWyatt  . HERNIA REPAIR  02/2010   Lap VWH  repair with mesh  . TONSILLECTOMY     Allergies  Allergen Reactions  . Percocet [Oxycodone-Acetaminophen] Itching and Other (See Comments)    Extreme agitation   Prior to Admission medications   Medication Sig Start Date End Date Taking? Authorizing Provider    clotrimazole-betamethasone (LOTRISONE) cream Apply 1 application topically 2 (two) times daily. 02/23/16  Yes Doristine BosworthZoe A Stallings, MD  elvitegravir-cobicistat-emtricitabine-tenofovir (STRIBILD) 150-150-200-300 MG TABS tablet Take 1 tablet by mouth daily with breakfast. 08/27/15  Yes Judyann Munsonynthia Snider, MD  diphenhydrAMINE (BENADRYL) 25 MG tablet Take 1 tablet (25 mg total) by mouth every 6 (six) hours as needed for itching. Patient not taking: Reported on 03/01/2016 02/23/16   Doristine BosworthZoe A Stallings, MD   Social History   Social History  . Marital status: Single    Spouse name: N/A  . Number of children: N/A  . Years of education: N/A   Occupational History  . Not on file.   Social History Main Topics  . Smoking status: Never Smoker  . Smokeless tobacco: Never Used  . Alcohol use 0.0 oz/week     Comment: social  . Drug use: No  . Sexual activity: Yes     Comment: decined condoms   Other Topics Concern  . Not on file   Social History Narrative  . No narrative on file   Review of Systems  Constitutional: Positive for chills and fever.  HENT: Positive for sore throat. Negative for congestion.   Gastrointestinal: Negative for nausea and vomiting.  Musculoskeletal: Positive for myalgias.   Objective:  Physical Exam  Constitutional: He appears well-developed and well-nourished. No distress.  HENT:  Head: Normocephalic and atraumatic.  Eyes: Conjunctivae are normal.  Neck: Neck supple.  Cardiovascular: Normal  rate, regular rhythm and normal heart sounds.  Exam reveals no gallop and no friction rub.   No murmur heard. Pulmonary/Chest: Effort normal and breath sounds normal. No respiratory distress. He has no wheezes. He has no rales.  Neurological: He is alert.  Skin: Skin is warm and dry.  Psychiatric: He has a normal mood and affect. His behavior is normal.  Nursing note and vitals reviewed.  BP 120/78 (BP Location: Right Arm, Patient Position: Sitting, Cuff Size: Normal)   Pulse (!)  120   Temp (!) 102.3 F (39.1 C) (Oral)   Ht 5' 10.5" (1.791 m)   Wt 220 lb (99.8 kg)   SpO2 97%   BMI 31.12 kg/m    Results for orders placed or performed in visit on 03/01/16  POCT Influenza A/B  Result Value Ref Range   Influenza A, POC Negative Negative   Influenza B, POC Negative Negative   Given 1g of tylenol at 5:30 when temp was 102._ , ibuprofen 800mg  at 6:30 when temp was 103 S/p 1L NS IVF.  Neg orthostatics. Assessment & Plan:   1. Fever, unspecified fever cause   2. Dehydration    Highly suspect influenza despite neg test due to generalized sxs, nml exam, and very sudden onset of sxs 5 hrs prior to arrival with rapid progression. Treat with w/ tamiflu. Alt tylenol/ibuprofen for antipyretic. Push fluids. Recheck in 2-3d, sooner if worsening.  Orders Placed This Encounter  Procedures  . Orthostatic vital signs  . POCT Influenza A/B  . POCT urinalysis dipstick    I personally performed the services described in this documentation, which was scribed in my presence. The recorded information has been reviewed and considered, and addended by me as needed.   Norberto SorensonEva Shaw, M.D.  [facility]  [place] [address] [place], [place] [address] [phone] phone [phone] fax  03/02/16 12:04 AM

## 2016-03-01 NOTE — Patient Instructions (Signed)
     IF you received an x-ray today, you will receive an invoice from [facility]. Please contact [facility] at [phone] with questions or concerns regarding your invoice.   IF you received labwork today, you will receive an invoice from [facility]. Please contact [facility] at [phone] with questions or concerns regarding your invoice.   Our billing staff will not be able to assist you with questions regarding bills from these companies.  You will be contacted with the lab results as soon as they are available. The fastest way to get your results is to activate your My Chart account. Instructions are located on the last page of this paperwork. If you have not heard from us regarding the results in 2 weeks, please contact this office.      

## 2016-03-02 MED ORDER — OSELTAMIVIR PHOSPHATE 75 MG PO CAPS: 75.0000 mg | ORAL_CAPSULE | Freq: Two times a day (BID) | ORAL | 0 refills | Status: DC

## 2016-03-05 ENCOUNTER — Ambulatory Visit (INDEPENDENT_AMBULATORY_CARE_PROVIDER_SITE_OTHER): Payer: BLUE CROSS/BLUE SHIELD | Admitting: Family Medicine

## 2016-03-05 VITALS — BP 118/80 | HR 103 | Temp 98.4°F | Resp 16 | Ht 70.5 in | Wt 221.0 lb

## 2016-03-05 DIAGNOSIS — E860 Dehydration: Secondary | ICD-10-CM

## 2016-03-05 LAB — POCT URINALYSIS DIP (MANUAL ENTRY)
BILIRUBIN UA: NEGATIVE
BILIRUBIN UA: NEGATIVE
Glucose, UA: NEGATIVE
LEUKOCYTES UA: NEGATIVE
Nitrite, UA: NEGATIVE
PROTEIN UA: NEGATIVE
Urobilinogen, UA: 0.2
pH, UA: 5

## 2016-03-05 NOTE — Progress Notes (Signed)
Patient ID: Eric SereneJames T Berthelot Jr., male    DOB: 01/04/1966, 50 y.o.   MRN: [id number]  PCP: Nilda SimmerSMITH,KRISTI, MD  Chief Complaint  Patient presents with  . Fatigue  . Dehydration    Feels like he could be dehydrated   . Diarrhea    Subjective:   HPI 50 year old male presents for follow-up after recent dx of influenza. [facility]  He reports a good appetite and he feels he has been drinking plenty of fluids but feels he is dehydrated and weak. Reports abdominal discomfort last evening with a few episodes of diarrhea. Diarrhea had a mucus like texture. Reports knowing that its time for his colonoscopy although he has someone in particular he would like to see and will call back with [place] of [place] [facility] [place]. Reports he has remained afebrile but would like fluids to rehydrate himself.  Social History   Social History  . Marital status: Single    Spouse name: N/A  . Number of children: N/A  . Years of education: N/A   Occupational History  . Not on file.   Social History Main Topics  . Smoking status: Never Smoker  . Smokeless tobacco: Never Used  . Alcohol use 0.0 oz/week     Comment: social  . Drug use: No  . Sexual activity: Yes     Comment: decined condoms   Other Topics Concern  . Not on file   Social History Narrative  . No narrative on file    Family History  Problem Relation Age of Onset  . Cancer Father    Review of Systems See HPI   Patient Active Problem List   Diagnosis Date Noted  . Sepsis (HCC) 03/15/2014  . SIRS (systemic inflammatory response syndrome) (HCC) 03/15/2014  . Pyrexia   . HIV (human immunodeficiency virus infection) (HCC)   . Abdominal pain, acute   . Nausea vomiting and diarrhea 03/14/2014  . History of kidney stones 08/12/2011  . Abdominal pain, R sided periumbilical 05/25/2011  . Human immunodeficiency virus (HIV) disease (HCC) 02/10/2006  . 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 ILEUS  02/10/2006     Prior to Admission medications   Medication Sig Start Date End Date Taking? Authorizing Provider  clotrimazole-betamethasone (LOTRISONE) cream Apply 1 application topically 2 (two) times daily. 02/23/16  Yes Doristine BosworthZoe A Stallings, MD  elvitegravir-cobicistat-emtricitabine-tenofovir (STRIBILD) 150-150-200-300 MG TABS tablet Take 1 tablet by mouth daily with breakfast. 08/27/15  Yes Judyann Munsonynthia Snider, MD  oseltamivir (TAMIFLU) 75 MG capsule Take 1 capsule (75 mg total) by mouth 2 (two) times daily. 03/02/16  Yes Sherren MochaEva N Shaw, MD  diphenhydrAMINE (BENADRYL) 25 MG tablet Take 1 tablet (25 mg total) by mouth every 6 (six) hours as needed for itching. Patient not taking: Reported on 03/05/2016 02/23/16   Doristine BosworthZoe A Stallings, MD    Allergies  Allergen Reactions  . Percocet [Oxycodone-Acetaminophen] Itching and Other (See Comments)    Extreme agitation      Objective:  Physical Exam  Constitutional: He is oriented to person, place, and time. He appears well-developed and well-nourished.  HENT:  Head: Normocephalic and atraumatic.  Eyes: Conjunctivae are normal. Pupils are equal, round, and reactive to light.  Neck: Normal range of motion.  Cardiovascular: Regular rhythm, normal heart sounds and intact distal pulses.   Pulmonary/Chest: Effort normal and breath sounds normal.  Musculoskeletal: Normal range of motion.  Lymphadenopathy:    He has cervical adenopathy.  Neurological: He is alert and oriented to person, place, and time.  Skin: Skin is warm and dry.  Psychiatric: He has a normal mood and affect. His behavior is normal. Judgment and thought content normal.    Vitals:   03/05/16 1504  BP: 118/80  Pulse: (!) 103  Resp: 16  Temp: 98.4 F (36.9 C)    Assessment & Plan:  1. Dehydration - POCT urinalysis dipstick Plan: -1 liter bolus of Normal Saline -Return for follow-up if diarrhea worsens or fever develops.  Godfrey PickKimberly S. Tiburcio PeaHarris, MSN, FNP-C [facility]

## 2016-03-05 NOTE — Patient Instructions (Addendum)
Continue oral fluid intake of water and Gatorade.   Return for follow-up as needed.  IF you received an x-ray today, you will receive an invoice from [facility]. Please contact [facility] at [phone] with questions or concerns regarding your invoice.   IF you received labwork today, you will receive an invoice from [facility]. Please contact [facility] at [phone] with questions or concerns regarding your invoice.   Our billing staff will not be able to assist you with questions regarding bills from these companies.  You will be contacted with the lab results as soon as they are available. The fastest way to get your results is to activate your My Chart account. Instructions are located on the last page of this paperwork. If you have not heard from us regarding the results in 2 weeks, please contact this office.      Dehydration, Adult Dehydration is when there is not enough fluid or water in your body. This happens when you lose more fluids than you take in. Dehydration can range from mild to very bad. It should be treated right away to keep it from getting very bad. Symptoms of mild dehydration may include:  Thirst.  Dry lips.  Slightly dry mouth.  Dry, warm skin.  Dizziness. Symptoms of moderate dehydration may include:  Very dry mouth.  Muscle cramps.  Dark pee (urine). Pee may be the color of tea.  Your body making less pee.  Your eyes making fewer tears.  Heartbeat that is uneven or faster than normal (palpitations).  Headache.  Light-headedness, especially when you stand up from sitting.  Fainting (syncope). Symptoms of very bad dehydration may include:  Changes in skin, such as:  Cold and clammy skin.  Blotchy (mottled) or pale skin.  Skin that does not quickly return to normal after being lightly pinched and let go (poor skin turgor).  Changes in body fluids, such as:  Feeling very  thirsty.  Your eyes making fewer tears.  Not sweating when body temperature is high, such as in hot weather.  Your body making very little pee.  Changes in vital signs, such as:  Weak pulse.  Pulse that is more than 100 beats a minute when you are sitting still.  Fast breathing.  Low blood pressure.  Other changes, such as:  Sunken eyes.  Cold hands and feet.  Confusion.  Lack of energy (lethargy).  Trouble waking up from sleep.  Short-term weight loss.  Unconsciousness. Follow these instructions at home:  If told by your doctor, drink an ORS:  Make an ORS by using instructions on the package.  Start by drinking small amounts, about  cup (120 mL) every 5-10 minutes.  Slowly drink more until you have had the amount that your doctor said to have.  Drink enough clear fluid to keep your pee clear or pale yellow. If you were told to drink an ORS, finish the ORS first, then start slowly drinking clear fluids. Drink fluids such as:  Water. Do not drink only water by itself. Doing that can make the salt (sodium) level in your body get too low (hyponatremia).  Ice chips.  Fruit juice that you have added water to (diluted).  Low-calorie sports drinks.  Avoid:  Alcohol.  Drinks that have a lot of sugar. These include high-calorie sports drinks, fruit juice that does not have water added, and soda.  Caffeine.  Foods that are greasy or have a lot of fat or sugar.  Take over-the-counter  and prescription medicines only as told by your doctor.  Do not take salt tablets. Doing that can make the salt level in your body get too high (hypernatremia).  Eat foods that have minerals (electrolytes). Examples include bananas, oranges, potatoes, tomatoes, and spinach.  Keep all follow-up visits as told by your doctor. This is important. Contact a doctor if:  You have belly (abdominal) pain that:  Gets worse.  Stays in one area (localizes).  You have a rash.  You  have a stiff neck.  You get angry or annoyed more easily than normal (irritability).  You are more sleepy than normal.  You have a harder time waking up than normal.  You feel:  Weak.  Dizzy.  Very thirsty.  You have peed (urinated) only a small amount of very dark pee during 6-8 hours. Get help right away if:  You have symptoms of very bad dehydration.  You cannot drink fluids without throwing up (vomiting).  Your symptoms get worse with treatment.  You have a fever.  You have a very bad headache.  You are throwing up or having watery poop (diarrhea) and it:  Gets worse.  Does not go away.  You have blood or something green (bile) in your throw-up.  You have blood in your poop (stool). This may cause poop to look black and tarry.  You have not peed in 6-8 hours.  You pass out (faint).  Your heart rate when you are sitting still is more than 100 beats a minute.  You have trouble breathing. This information is not intended to replace advice given to you by your health care provider. Make sure you discuss any questions you have with your health care provider. Document Released: 01/08/2009 Document Revised: 10/02/2015 Document Reviewed: 05/08/2015 Elsevier Interactive Patient Education  2017 [employer]

## 2016-04-16 DIAGNOSIS — J180 Bronchopneumonia, unspecified organism: Secondary | ICD-10-CM | POA: Diagnosis not present

## 2016-05-25 ENCOUNTER — Other Ambulatory Visit: Payer: Self-pay | Admitting: Family Medicine

## 2016-07-11 DIAGNOSIS — R21 Rash and other nonspecific skin eruption: Secondary | ICD-10-CM | POA: Diagnosis not present

## 2016-07-11 DIAGNOSIS — Z7721 Contact with and (suspected) exposure to potentially hazardous body fluids: Secondary | ICD-10-CM | POA: Diagnosis not present

## 2016-07-20 DIAGNOSIS — A515 Early syphilis, latent: Secondary | ICD-10-CM | POA: Diagnosis not present

## 2016-08-17 DIAGNOSIS — L259 Unspecified contact dermatitis, unspecified cause: Secondary | ICD-10-CM | POA: Diagnosis not present

## 2016-08-17 DIAGNOSIS — B20 Human immunodeficiency virus [HIV] disease: Secondary | ICD-10-CM | POA: Diagnosis not present

## 2016-08-17 DIAGNOSIS — A530 Latent syphilis, unspecified as early or late: Secondary | ICD-10-CM | POA: Diagnosis not present

## 2016-09-11 ENCOUNTER — Other Ambulatory Visit: Payer: Self-pay | Admitting: Internal Medicine

## 2016-09-11 DIAGNOSIS — B20 Human immunodeficiency virus [HIV] disease: Secondary | ICD-10-CM

## 2016-09-12 NOTE — Telephone Encounter (Signed)
Called to set up appointment. Pt needs refills. Left VM to call office back.

## 2016-09-15 ENCOUNTER — Telehealth: Payer: Self-pay | Admitting: *Deleted

## 2016-09-15 NOTE — Telephone Encounter (Signed)
Patient called stating pharmacy will not refill his meds until he has an appt here. Advised patient he can come in and see our pharmacist tomorrow and will likely have labs done. He was scheduled at 10:30 AM. Patient uses a mail order pharmacy. Wendall MolaJacqueline Cockerham

## 2016-09-16 ENCOUNTER — Ambulatory Visit (INDEPENDENT_AMBULATORY_CARE_PROVIDER_SITE_OTHER): Payer: BLUE CROSS/BLUE SHIELD | Admitting: Pharmacist Clinician (PhC)/ Clinical Pharmacy Specialist

## 2016-09-16 ENCOUNTER — Other Ambulatory Visit (HOSPITAL_COMMUNITY)
Admission: RE | Admit: 2016-09-16 | Discharge: 2016-09-16 | Disposition: A | Discharge status: Home / Self Care | Payer: BLUE CROSS/BLUE SHIELD | Source: Ambulatory Visit | Attending: Internal Medicine | Admitting: Internal Medicine

## 2016-09-16 ENCOUNTER — Ambulatory Visit: Payer: BLUE CROSS/BLUE SHIELD

## 2016-09-16 DIAGNOSIS — B20 Human immunodeficiency virus [HIV] disease: Secondary | ICD-10-CM | POA: Diagnosis not present

## 2016-09-16 LAB — CBC
HEMATOCRIT: 46.5 % (ref 38.5–50.0)
Hemoglobin: 15.5 g/dL (ref 13.2–17.1)
MCH: 29 pg (ref 27.0–33.0)
MCHC: 33.3 g/dL (ref 32.0–36.0)
MCV: 86.9 fL (ref 80.0–100.0)
MPV: 9.2 fL (ref 7.5–12.5)
PLATELETS: 328 10*3/uL (ref 140–400)
RBC: 5.35 MIL/uL (ref 4.20–5.80)
RDW: 14 % (ref 11.0–15.0)
WBC: 6.5 10*3/uL (ref 3.8–10.8)

## 2016-09-16 LAB — COMPLETE METABOLIC PANEL WITH GFR
ALT: 23 U/L (ref 9–46)
AST: 17 U/L (ref 10–35)
Albumin: 4.2 g/dL (ref 3.6–5.1)
Alkaline Phosphatase: 36 U/L — ABNORMAL LOW (ref 40–115)
BILIRUBIN TOTAL: 0.4 mg/dL (ref 0.2–1.2)
BUN: 13 mg/dL (ref 7–25)
CALCIUM: 9.4 mg/dL (ref 8.6–10.3)
CHLORIDE: 102 mmol/L (ref 98–110)
CO2: 24 mmol/L (ref 20–31)
CREATININE: 0.88 mg/dL (ref 0.70–1.33)
GFR, Est African American: >89 mL/min (ref >=60)
GFR, Est Non African American: >89 mL/min (ref >=60)
Glucose, Bld: 106 mg/dL — ABNORMAL HIGH (ref 65–99)
Potassium: 4 mmol/L (ref 3.5–5.3)
Sodium: 137 mmol/L (ref 135–146)
TOTAL PROTEIN: 7.4 g/dL (ref 6.1–8.1)

## 2016-09-16 LAB — T-HELPER CELL (CD4) - (RCID CLINIC ONLY)
CD4 % Helper T Cell: 16 % — ABNORMAL LOW (ref 33–55)
CD4 T Cell Abs: 320 /uL — ABNORMAL LOW (ref 400–2700)

## 2016-09-16 MED ORDER — ELVITEG-COBIC-EMTRICIT-TENOFAF 150-150-200-10 MG PO TABS: 1.0000 | ORAL_TABLET | Freq: Every day | ORAL | 4 refills | Status: DC

## 2016-09-16 NOTE — Progress Notes (Signed)
HPI: Eric SereneJames T Ramanathan Jr. is a 51 y.o. male who was last seen here in June 2017.   Allergies: Allergies  Allergen Reactions  . Percocet [Oxycodone-Acetaminophen] Itching and Other (See Comments)    Extreme agitation    Vitals:    Past Medical History: Past Medical History:  Diagnosis Date  . HIV infection (HCC)   . RLQ abdominal pain     Social History: Social History   Social History  . Marital status: Single    Spouse name: N/A  . Number of children: N/A  . Years of education: N/A   Social History Main Topics  . Smoking status: Never Smoker  . Smokeless tobacco: Never Used  . Alcohol use 0.0 oz/week     Comment: social  . Drug use: No  . Sexual activity: Yes     Comment: decined condoms   Other Topics Concern  . Not on file   Social History Narrative  . No narrative on file    Previous Regimen: None  Current Regimen: Stribild  Labs: HIV 1 RNA Quant (copies/mL)  Date Value  08/27/2015 62,561 (H)  06/26/2014 25 (H)  03/15/2014 30 (H)   CD4 T Cell Abs (/uL)  Date Value  09/16/2016 320 (L)  08/27/2015 190 (L)  06/26/2014 460   Hepatitis B Surface Ag (no units)  Date Value  04/06/2012 NEGATIVE   HCV Ab (no units)  Date Value  04/06/2012 NEGATIVE    CrCl: CrCl cannot be calculated (Patient's most recent lab result is older than the maximum 21 days allowed.).  Lipids:    Component Value Date/Time   CHOL 132 08/27/2015 1548   TRIG 141 08/27/2015 1548   HDL 30 (L) 08/27/2015 1548   CHOLHDL 4.4 08/27/2015 1548   VLDL 28 08/27/2015 1548   LDLCALC 74 08/27/2015 1548    Assessment:  Eric Chapman was last seen by Dr. Drue SecondSnider in June of 2017. Asked of why he didn't make the f/u appt when he checked out last June, he stated that he was overwhelmed with medical bills so he is trying keeps the medical cost down. Advised him that he must stay in care and we can help to figure out some of the cost issue. He was doing a good job of taking his Stribild until  about 5 months ago, where he stopped it completely, mainly due to stress and lack of motivation. Counseled him on interruption of therapy could cause a lot of health issue including resistance, decline in immune function, etc. When he stopped, he stopped it completely rather than spacing it out. Told him that we have counselor here that could help in that situation.   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 The cost is quite significant also. He is a top partner during sexual encounter. He doesn't use condoms because he is very open with status to the other person. He stated that one recent partner, told him about doubling up the dose of TRUVADA? Told him that I'm not sure if that person was using the daily truvada or the as needed PrEP where they would take 2 tablets upfront then daily for 2 days. Going to get all labs today including all sites STDs since he doesn't use condoms.   Recommendations:  HIV/STD labs today [facility] with only a couple of refills F/u with Dr. Drue SecondSnider next month  Ulyses SouthwardMinh Pham, PharmD, BCPS, AAHIVP, CPP Clinical Infectious Disease Pharmacist [facility]  09/16/2016, 1:57 PM

## 2016-09-16 NOTE — Patient Instructions (Signed)
Stop Stribild, start Genvoya 1 daily with food

## 2016-09-17 LAB — RPR

## 2016-09-19 LAB — CYTOLOGY, (ORAL, ANAL, URETHRAL) ANCILLARY ONLY
CHLAMYDIA, DNA PROBE: NEGATIVE
Chlamydia: NEGATIVE
Neisseria Gonorrhea: NEGATIVE
Neisseria Gonorrhea: NEGATIVE

## 2016-09-19 LAB — HIV RNA, RTPCR W/R GT (RTI, PI,INT)
HIV-1 RNA, QN PCR: DETECTED {copies}/mL
HIV-1 RNA, QN PCR: <1.3 Log copies/mL

## 2016-10-17 ENCOUNTER — Ambulatory Visit (INDEPENDENT_AMBULATORY_CARE_PROVIDER_SITE_OTHER): Payer: BLUE CROSS/BLUE SHIELD | Admitting: Internal Medicine

## 2016-10-17 ENCOUNTER — Encounter: Payer: Self-pay | Admitting: Internal Medicine

## 2016-10-17 VITALS — BP 127/89 | HR 87 | Temp 98.4°F | Wt 220.1 lb

## 2016-10-17 DIAGNOSIS — B20 Human immunodeficiency virus [HIV] disease: Secondary | ICD-10-CM | POA: Diagnosis not present

## 2016-10-17 DIAGNOSIS — Z23 Encounter for immunization: Secondary | ICD-10-CM | POA: Diagnosis not present

## 2016-10-17 NOTE — Progress Notes (Signed)
Patient ID: Eric Serene., male   DOB: 1965-10-03, 51 y.o.   MRN: [id number]  HPI Eric Chapman is a 51yo M with hiv disease, getting back into care, CD 4 count of 320/VL 62,500 started on genvoya last month when he saw clinical pharmacist. He has been off and on of HAART  Likely due to not prioritizing his health but he appears to have a change of perspective he tells me at this visit and now is motivated to take his meds. He reports taking alternating genvoya and stribild since he was going between 2 locations.  Outpatient Encounter Prescriptions as of 10/17/2016  Medication Sig  . elvitegravir-cobicistat-emtricitabine-tenofovir (GENVOYA) 150-150-200-10 MG TABS tablet Take 1 tablet by mouth daily with breakfast.  . clotrimazole-betamethasone (LOTRISONE) cream APPLY EXTERNALLY TO THE AFFECTED AREA TWICE DAILY (Patient not taking: Reported on 09/16/2016)  . diphenhydrAMINE (BENADRYL) 25 MG tablet Take 1 tablet (25 mg total) by mouth every 6 (six) hours as needed for itching. (Patient not taking: Reported on 03/05/2016)   No facility-administered encounter medications on file as of 10/17/2016.      Patient Active Problem List   Diagnosis Date Noted  . Sepsis (HCC) 03/15/2014  . SIRS (systemic inflammatory response syndrome) (HCC) 03/15/2014  . Pyrexia   . HIV (human immunodeficiency virus infection) (HCC)   . Abdominal pain, acute   . Nausea vomiting and diarrhea 03/14/2014  . History of kidney stones 08/12/2011  . Abdominal pain, R sided periumbilical 05/25/2011  . Human immunodeficiency virus (HIV) disease (HCC) 02/10/2006  . 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 ILEUS 02/10/2006     Health Maintenance Due  Topic Date Due  . COLONOSCOPY  03/02/2016     Review of Systems Review of Systems  Constitutional: Negative for fever, chills, diaphoresis, activity change, appetite change, fatigue and unexpected weight change.  HENT: Negative for congestion, sore throat,  rhinorrhea, sneezing, trouble swallowing and sinus pressure.  Eyes: Negative for photophobia and visual disturbance.  Respiratory: Negative for cough, chest tightness, shortness of breath, wheezing and stridor.  Cardiovascular: Negative for chest pain, palpitations and leg swelling.  Gastrointestinal: Negative for nausea, vomiting, abdominal pain, diarrhea, constipation, blood in stool, abdominal distention and anal bleeding.  Genitourinary: Negative for dysuria, hematuria, flank pain and difficulty urinating.  Musculoskeletal: Negative for myalgias, back pain, joint swelling, arthralgias and gait problem.  Skin: Negative for color change, pallor, rash and wound.  Neurological: Negative for dizziness, tremors, weakness and light-headedness.  Hematological: Negative for adenopathy. Does not bruise/bleed easily.  Psychiatric/Behavioral: Negative for behavioral problems, confusion, sleep disturbance, dysphoric mood, decreased concentration and agitation.    Physical Exam   BP 127/89   Pulse 87   Temp 98.4 F (36.9 C) (Oral)   Wt 220 lb 1.9 oz (99.8 kg)   BMI 31.14 kg/m   Physical Exam  Constitutional: He is oriented to person, place, and time. He appears well-developed and well-nourished. No distress.  HENT:  Mouth/Throat: Oropharynx is clear and moist. No oropharyngeal exudate.  Cardiovascular: Normal rate, regular rhythm and normal heart sounds. Exam reveals no gallop and no friction rub.  No murmur heard.  Pulmonary/Chest: Effort normal and breath sounds normal. No respiratory distress. He has no wheezes.  Abdominal: Soft. Bowel sounds are normal. He exhibits no distension. There is no tenderness.  Lymphadenopathy:  He has no cervical adenopathy.  Neurological: He is alert and oriented to person, place, and time.  Skin: Skin is warm and dry. No  rash noted. No erythema.  Psychiatric: He has a normal mood and affect. His behavior is normal.    Lab Results  Component Value Date    CD4TCELL 16 (L) 09/16/2016   Lab Results  Component Value Date   CD4TABS 320 (L) 09/16/2016   CD4TABS 190 (L) 08/27/2015   CD4TABS 460 06/26/2014   Lab Results  Component Value Date   HIV1RNAQUANT 62,561 (H) 08/27/2015   No results found for: HEPBSAB Lab Results  Component Value Date   LABRPR NON REAC 09/16/2016    CBC Lab Results  Component Value Date   WBC 6.5 09/16/2016   RBC 5.35 09/16/2016   HGB 15.5 09/16/2016   HCT 46.5 09/16/2016   PLT 328 09/16/2016   MCV 86.9 09/16/2016   MCH 29.0 09/16/2016   MCHC 33.3 09/16/2016   RDW 14.0 09/16/2016   LYMPHSABS 1,800 08/27/2015   MONOABS 660 08/27/2015   EOSABS 240 08/27/2015    BMET Lab Results  Component Value Date   NA 137 09/16/2016   K 4.0 09/16/2016   CL 102 09/16/2016   CO2 24 09/16/2016   GLUCOSE 106 (H) 09/16/2016   BUN 13 09/16/2016   CREATININE 0.88 09/16/2016   CALCIUM 9.4 09/16/2016   GFRNONAA >89 09/16/2016   GFRAA >89 09/16/2016      Assessment and Plan  hiv disease= continue genvoya. Spent 15-20 min on adherence counseling. Will see how he does over the next 2 months with improved adherence. If adherence is still poor, then need to consider boosted pi regimen.  Spent 25 min with adherence counseling on hiv disease.

## 2016-10-18 LAB — HEPATITIS C ANTIBODY: HCV AB: NEGATIVE

## 2017-01-26 ENCOUNTER — Ambulatory Visit (INDEPENDENT_AMBULATORY_CARE_PROVIDER_SITE_OTHER): Payer: BLUE CROSS/BLUE SHIELD | Admitting: Internal Medicine

## 2017-01-26 ENCOUNTER — Encounter: Payer: Self-pay | Admitting: Internal Medicine

## 2017-01-26 VITALS — BP 123/88 | HR 88 | Temp 98.6°F | Wt 220.0 lb

## 2017-01-26 DIAGNOSIS — F439 Reaction to severe stress, unspecified: Secondary | ICD-10-CM

## 2017-01-26 DIAGNOSIS — B20 Human immunodeficiency virus [HIV] disease: Secondary | ICD-10-CM | POA: Diagnosis not present

## 2017-01-26 DIAGNOSIS — Z9119 Patient's noncompliance with other medical treatment and regimen: Secondary | ICD-10-CM | POA: Diagnosis not present

## 2017-01-26 DIAGNOSIS — Z91199 Patient's noncompliance with other medical treatment and regimen due to unspecified reason: Secondary | ICD-10-CM

## 2017-01-26 MED ORDER — DARUNAVIR ETHANOLATE 800 MG PO TABS: 800.0000 mg | ORAL_TABLET | Freq: Every day | ORAL | 11 refills | Status: DC

## 2017-01-26 MED ORDER — ELVITEG-COBIC-EMTRICIT-TENOFAF 150-150-200-10 MG PO TABS: 1.0000 | ORAL_TABLET | Freq: Every day | ORAL | 11 refills | Status: DC

## 2017-01-26 NOTE — Progress Notes (Signed)
Patient ID: Eric Serene., male   DOB: October 23, 1965, 51 y.o.   MRN: [id number]  HPI Eric Chapman isa 51yo M with hiv disease, currently on genvoya with occ gaps on meds. He reports that he does not think he is virologically controlled. Spent all of visit discussing family stressors, financial argument about property.  Outpatient Encounter Prescriptions as of 01/26/2017  Medication Sig  . elvitegravir-cobicistat-emtricitabine-tenofovir (GENVOYA) 150-150-200-10 MG TABS tablet Take 1 tablet by mouth daily with breakfast.   No facility-administered encounter medications on file as of 01/26/2017.      Patient Active Problem List   Diagnosis Date Noted  . Sepsis (HCC) 03/15/2014  . SIRS (systemic inflammatory response syndrome) (HCC) 03/15/2014  . Pyrexia   . HIV (human immunodeficiency virus infection) (HCC)   . Abdominal pain, acute   . Nausea vomiting and diarrhea 03/14/2014  . History of kidney stones 08/12/2011  . Abdominal pain, R sided periumbilical 05/25/2011  . Human immunodeficiency virus (HIV) disease (HCC) 02/10/2006  . 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 ILEUS 02/10/2006     Health Maintenance Due  Topic Date Due  . COLONOSCOPY  03/02/2016  . INFLUENZA VACCINE  10/26/2016     Review of Systems  Constitutional: Negative for fever, chills, diaphoresis, activity change, appetite change, fatigue and unexpected weight change.  HENT: Negative for congestion, sore throat, rhinorrhea, sneezing, trouble swallowing and sinus pressure.  Eyes: Negative for photophobia and visual disturbance.  Respiratory: Negative for cough, chest tightness, shortness of breath, wheezing and stridor.  Cardiovascular: Negative for chest pain, palpitations and leg swelling.  Gastrointestinal: Negative for nausea, vomiting, abdominal pain, diarrhea, constipation, blood in stool, abdominal distention and anal bleeding.  Genitourinary: Negative for dysuria, hematuria, flank pain and  difficulty urinating.  Musculoskeletal: Negative for myalgias, back pain, joint swelling, arthralgias and gait problem.  Skin: Negative for color change, pallor, rash and wound.  Neurological: Negative for dizziness, tremors, weakness and light-headedness.  Hematological: Negative for adenopathy. Does not bruise/bleed easily.  Psychiatric/Behavioral: +stress   Physical Exam   BP 123/88   Pulse 88   Temp 98.6 F (37 C) (Oral)   Wt 220 lb (99.8 kg)   BMI 31.12 kg/m   Physical Exam  Constitutional: He is oriented to person, place, and time. He appears well-developed and well-nourished. No distress.  HENT:  Mouth/Throat: Oropharynx is clear and moist. No oropharyngeal exudate.  Cardiovascular: Normal rate, regular rhythm and normal heart sounds. Exam reveals no gallop and no friction rub.  No murmur heard.  Pulmonary/Chest: Effort normal and breath sounds normal. No respiratory distress. He has no wheezes.  Abdominal: Soft. Bowel sounds are normal. He exhibits no distension. There is no tenderness.  Lymphadenopathy:  He has no cervical adenopathy.  Neurological: He is alert and oriented to person, place, and time.  Skin: Skin is warm and dry. No rash noted. No erythema.  Psychiatric: He has a normal mood and affect. His behavior is normal.    Lab Results  Component Value Date   CD4TCELL 16 (L) 09/16/2016   Lab Results  Component Value Date   CD4TABS 320 (L) 09/16/2016   CD4TABS 190 (L) 08/27/2015   CD4TABS 460 06/26/2014   Lab Results  Component Value Date   HIV1RNAQUANT 62,561 (H) 08/27/2015   No results found for: HEPBSAB Lab Results  Component Value Date   LABRPR NON REAC 09/16/2016    CBC Lab Results  Component Value Date   WBC 6.5  09/16/2016   RBC 5.35 09/16/2016   HGB 15.5 09/16/2016   HCT 46.5 09/16/2016   PLT 328 09/16/2016   MCV 86.9 09/16/2016   MCH 29.0 09/16/2016   MCHC 33.3 09/16/2016   RDW 14.0 09/16/2016   LYMPHSABS 1,800 08/27/2015    MONOABS 660 08/27/2015   EOSABS 240 08/27/2015    BMET Lab Results  Component Value Date   NA 137 09/16/2016   K 4.0 09/16/2016   CL 102 09/16/2016   CO2 24 09/16/2016   GLUCOSE 106 (H) 09/16/2016   BUN 13 09/16/2016   CREATININE 0.88 09/16/2016   CALCIUM 9.4 09/16/2016   GFRNONAA >89 09/16/2016   GFRAA >89 09/16/2016      Assessment and Plan  hiv disease= has spotty adherence. Will check viral load and plan to add darunavir. If detectable, will add genotype  Health maintenance =will give flu vaccine  Acute stressor = gave opportunity to speak with inhouse counselor  Spent 35 min on visit  With greater than 50% on adherence counseling for his hiv regimen

## 2017-01-27 LAB — COMPLETE METABOLIC PANEL WITH GFR
AG Ratio: 1.5 (calc) (ref 1.0–2.5)
ALKALINE PHOSPHATASE (APISO): 40 U/L (ref 40–115)
ALT: 27 U/L (ref 9–46)
AST: 19 U/L (ref 10–35)
Albumin: 4.5 g/dL (ref 3.6–5.1)
BILIRUBIN TOTAL: 0.4 mg/dL (ref 0.2–1.2)
BUN: 15 mg/dL (ref 7–25)
CHLORIDE: 103 mmol/L (ref 98–110)
CO2: 27 mmol/L (ref 20–32)
Calcium: 10 mg/dL (ref 8.6–10.3)
Creat: 0.98 mg/dL (ref 0.70–1.33)
GFR, Est African American: 104 mL/min/{1.73_m2} (ref >=60)
GFR, Est Non African American: 90 mL/min/{1.73_m2} (ref >=60)
GLUCOSE: 96 mg/dL (ref 65–99)
Globulin: 3.1 g/dL (calc) (ref 1.9–3.7)
Potassium: 4.7 mmol/L (ref 3.5–5.3)
Sodium: 138 mmol/L (ref 135–146)
TOTAL PROTEIN: 7.6 g/dL (ref 6.1–8.1)

## 2017-01-27 LAB — CBC WITH DIFFERENTIAL/PLATELET
BASOS ABS: 42 {cells}/uL (ref 0–200)
Basophils Relative: 0.5 %
EOS ABS: 353 {cells}/uL (ref 15–500)
EOS PCT: 4.2 %
HCT: 45.8 % (ref 38.5–50.0)
HEMOGLOBIN: 15.8 g/dL (ref 13.2–17.1)
Lymphs Abs: 2512 cells/uL (ref 850–3900)
MCH: 29.9 pg (ref 27.0–33.0)
MCHC: 34.5 g/dL (ref 32.0–36.0)
MCV: 86.6 fL (ref 80.0–100.0)
MPV: 9.4 fL (ref 7.5–12.5)
Monocytes Relative: 8.4 %
NEUTROS PCT: 57 %
Neutro Abs: 4788 cells/uL (ref 1500–7800)
Platelets: 351 10*3/uL (ref 140–400)
RBC: 5.29 10*6/uL (ref 4.20–5.80)
RDW: 12.1 % (ref 11.0–15.0)
TOTAL LYMPHOCYTE: 29.9 %
WBC mixed population: 706 cells/uL (ref 200–950)
WBC: 8.4 10*3/uL (ref 3.8–10.8)

## 2017-01-27 LAB — T-HELPER CELL (CD4) - (RCID CLINIC ONLY)
CD4 T CELL ABS: 450 /uL (ref 400–2700)
CD4 T CELL HELPER: 15 % — AB (ref 33–55)

## 2017-01-27 LAB — RPR: RPR: NONREACTIVE

## 2017-01-30 LAB — HIV-1 RNA QUANT-NO REFLEX-BLD
HIV 1 RNA QUANT: DETECTED {copies}/mL — AB
HIV-1 RNA Quant, Log: <1.3 Log copies/mL — AB

## 2017-03-02 DIAGNOSIS — I10 Essential (primary) hypertension: Secondary | ICD-10-CM | POA: Diagnosis not present

## 2017-03-02 DIAGNOSIS — J069 Acute upper respiratory infection, unspecified: Secondary | ICD-10-CM | POA: Diagnosis not present

## 2017-03-15 ENCOUNTER — Ambulatory Visit: Payer: BLUE CROSS/BLUE SHIELD | Admitting: Internal Medicine

## 2017-03-16 ENCOUNTER — Ambulatory Visit: Payer: BLUE CROSS/BLUE SHIELD | Admitting: Internal Medicine

## 2017-03-17 DIAGNOSIS — R197 Diarrhea, unspecified: Secondary | ICD-10-CM | POA: Diagnosis not present

## 2017-05-03 DIAGNOSIS — J069 Acute upper respiratory infection, unspecified: Secondary | ICD-10-CM | POA: Diagnosis not present

## 2017-05-03 DIAGNOSIS — R05 Cough: Secondary | ICD-10-CM | POA: Diagnosis not present

## 2017-05-10 ENCOUNTER — Ambulatory Visit: Payer: BLUE CROSS/BLUE SHIELD | Admitting: Internal Medicine

## 2017-05-29 ENCOUNTER — Ambulatory Visit: Payer: BLUE CROSS/BLUE SHIELD | Admitting: Internal Medicine

## 2017-07-13 ENCOUNTER — Ambulatory Visit: Payer: BLUE CROSS/BLUE SHIELD | Admitting: Internal Medicine

## 2017-08-17 ENCOUNTER — Encounter: Payer: Self-pay | Admitting: Family Medicine

## 2018-01-11 DIAGNOSIS — L237 Allergic contact dermatitis due to plants, except food: Secondary | ICD-10-CM | POA: Diagnosis not present

## 2018-02-01 ENCOUNTER — Other Ambulatory Visit: Payer: Self-pay | Admitting: Behavioral Health

## 2018-02-01 DIAGNOSIS — B20 Human immunodeficiency virus [HIV] disease: Secondary | ICD-10-CM

## 2018-02-01 MED ORDER — DARUNAVIR ETHANOLATE 800 MG PO TABS: 800.0000 mg | ORAL_TABLET | Freq: Every day | ORAL | 0 refills | Status: DC

## 2018-02-01 MED ORDER — ELVITEG-COBIC-EMTRICIT-TENOFAF 150-150-200-10 MG PO TABS: 1.0000 | ORAL_TABLET | Freq: Every day | ORAL | 0 refills | Status: DC

## 2018-02-02 DIAGNOSIS — L03032 Cellulitis of left toe: Secondary | ICD-10-CM | POA: Diagnosis not present

## 2018-02-05 DIAGNOSIS — L03019 Cellulitis of unspecified finger: Secondary | ICD-10-CM | POA: Diagnosis not present

## 2018-04-09 ENCOUNTER — Other Ambulatory Visit: Payer: Self-pay

## 2018-04-09 ENCOUNTER — Other Ambulatory Visit: Payer: BLUE CROSS/BLUE SHIELD

## 2018-04-09 DIAGNOSIS — Z79899 Other long term (current) drug therapy: Secondary | ICD-10-CM

## 2018-04-09 DIAGNOSIS — B20 Human immunodeficiency virus [HIV] disease: Secondary | ICD-10-CM

## 2018-04-09 DIAGNOSIS — Z113 Encounter for screening for infections with a predominantly sexual mode of transmission: Secondary | ICD-10-CM

## 2018-04-11 LAB — HIV-1 RNA QUANT-NO REFLEX-BLD
HIV 1 RNA Quant: 1180000 copies/mL — ABNORMAL HIGH
HIV-1 RNA Quant, Log: 6.07 Log copies/mL — ABNORMAL HIGH

## 2018-04-11 LAB — LIPID PANEL
Cholesterol: 126 mg/dL (ref <200)
HDL: 31 mg/dL — ABNORMAL LOW (ref >40)
LDL Cholesterol (Calc): 63 mg/dL (calc)
NON-HDL CHOLESTEROL (CALC): 95 mg/dL (ref <130)
Total CHOL/HDL Ratio: 4.1 (calc) (ref <5.0)
Triglycerides: 275 mg/dL — ABNORMAL HIGH (ref <150)

## 2018-04-11 LAB — CBC WITH DIFFERENTIAL/PLATELET
Absolute Monocytes: 674 cells/uL (ref 200–950)
Basophils Absolute: 32 cells/uL (ref 0–200)
Basophils Relative: 0.5 %
Eosinophils Absolute: 221 cells/uL (ref 15–500)
Eosinophils Relative: 3.5 %
HEMATOCRIT: 44.3 % (ref 38.5–50.0)
Hemoglobin: 15.2 g/dL (ref 13.2–17.1)
Lymphs Abs: 1651 cells/uL (ref 850–3900)
MCH: 29.9 pg (ref 27.0–33.0)
MCHC: 34.3 g/dL (ref 32.0–36.0)
MCV: 87 fL (ref 80.0–100.0)
MPV: 9.8 fL (ref 7.5–12.5)
Monocytes Relative: 10.7 %
Neutro Abs: 3723 cells/uL (ref 1500–7800)
Neutrophils Relative %: 59.1 %
Platelets: 319 10*3/uL (ref 140–400)
RBC: 5.09 10*6/uL (ref 4.20–5.80)
RDW: 11.8 % (ref 11.0–15.0)
Total Lymphocyte: 26.2 %
WBC: 6.3 10*3/uL (ref 3.8–10.8)

## 2018-04-11 LAB — COMPREHENSIVE METABOLIC PANEL
AG Ratio: 1.6 (calc) (ref 1.0–2.5)
ALT: 25 U/L (ref 9–46)
AST: 21 U/L (ref 10–35)
Albumin: 4.2 g/dL (ref 3.6–5.1)
Alkaline phosphatase (APISO): 41 U/L (ref 40–115)
BUN: 9 mg/dL (ref 7–25)
CO2: 25 mmol/L (ref 20–32)
Calcium: 9.5 mg/dL (ref 8.6–10.3)
Chloride: 103 mmol/L (ref 98–110)
Creat: 0.91 mg/dL (ref 0.70–1.33)
GLUCOSE: 103 mg/dL — AB (ref 65–99)
Globulin: 2.6 g/dL (calc) (ref 1.9–3.7)
Potassium: 3.8 mmol/L (ref 3.5–5.3)
Sodium: 140 mmol/L (ref 135–146)
Total Bilirubin: 0.3 mg/dL (ref 0.2–1.2)
Total Protein: 6.8 g/dL (ref 6.1–8.1)

## 2018-04-11 LAB — T-HELPER CELL (CD4) - (RCID CLINIC ONLY)
CD4 % Helper T Cell: 20 % — ABNORMAL LOW (ref 33–55)
CD4 T Cell Abs: 360 /uL — ABNORMAL LOW (ref 400–2700)

## 2018-04-17 ENCOUNTER — Telehealth: Payer: Self-pay | Admitting: *Deleted

## 2018-04-17 NOTE — Telephone Encounter (Signed)
-----   Message from Judyann Munson, MD sent at 04/16/2018  2:55 PM EST ----- Can you call him. Does he need access to his hiv meds. His VL is crazy high at 

## 2018-04-17 NOTE — Telephone Encounter (Signed)
Per Drue Second message called to patient to check and see if he has his meds and if so if he is taking them in an effort to get him back on track. Had to leave a message for him to call the office and get more information.

## 2018-04-23 ENCOUNTER — Ambulatory Visit: Payer: BLUE CROSS/BLUE SHIELD | Admitting: Internal Medicine

## 2018-04-23 ENCOUNTER — Encounter: Payer: Self-pay | Admitting: Internal Medicine

## 2018-04-23 VITALS — BP 143/88 | HR 85 | Temp 98.2°F | Wt 227.0 lb

## 2018-04-23 DIAGNOSIS — F329 Major depressive disorder, single episode, unspecified: Secondary | ICD-10-CM

## 2018-04-23 DIAGNOSIS — Z91120 Patient's intentional underdosing of medication regimen due to financial hardship: Secondary | ICD-10-CM

## 2018-04-23 DIAGNOSIS — Z23 Encounter for immunization: Secondary | ICD-10-CM

## 2018-04-23 DIAGNOSIS — Z21 Asymptomatic human immunodeficiency virus [HIV] infection status: Secondary | ICD-10-CM

## 2018-04-23 DIAGNOSIS — F439 Reaction to severe stress, unspecified: Secondary | ICD-10-CM

## 2018-04-23 DIAGNOSIS — F320 Major depressive disorder, single episode, mild: Secondary | ICD-10-CM

## 2018-04-23 DIAGNOSIS — M1990 Unspecified osteoarthritis, unspecified site: Secondary | ICD-10-CM

## 2018-04-23 DIAGNOSIS — B20 Human immunodeficiency virus [HIV] disease: Secondary | ICD-10-CM

## 2018-04-23 NOTE — Patient Instructions (Signed)
  For muscle aches/athritis =   Recommend to take ibuprofen 200mg , take 2 tabs plus 1 tablet of tylenol 500mg  =together for relief (can take up to 3 times per day)

## 2018-04-23 NOTE — Progress Notes (Signed)
RFV: hiv disease  Patient ID: Eric SereneJames T Giusto Jr., male   DOB: 07/17/1965, 53 y.o.   MRN: [id number]  HPI CD 4 count of 360/VL 1.18MU Previously on genvoya-DRV. Stopped taking meds roughly 6 months ago. Doesn't prioritize his selfworth/health. Tearful today -Financial stress as a Education administratorpainter, focused on getting out of debt. He states that he doesn't notice much change since he has stopped taking his hiv regimen  Outpatient Encounter Medications as of 04/23/2018  Medication Sig  . darunavir (PREZISTA) 800 MG tablet Take 1 tablet (800 mg total) by mouth daily.  Marland Kitchen. elvitegravir-cobicistat-emtricitabine-tenofovir (GENVOYA) 150-150-200-10 MG TABS tablet Take 1 tablet by mouth daily with breakfast.   No facility-administered encounter medications on file as of 04/23/2018.      Patient Active Problem List   Diagnosis Date Noted  . Sepsis (HCC) 03/15/2014  . SIRS (systemic inflammatory response syndrome) (HCC) 03/15/2014  . Pyrexia   . HIV (human immunodeficiency virus infection) (HCC)   . Abdominal pain, acute   . Nausea vomiting and diarrhea 03/14/2014  . History of kidney stones 08/12/2011  . Abdominal pain, R sided periumbilical 05/25/2011  . Human immunodeficiency virus (HIV) disease (HCC) 02/10/2006  . 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 ILEUS 02/10/2006     Health Maintenance Due  Topic Date Due  . COLONOSCOPY  03/02/2016  . INFLUENZA VACCINE  10/26/2017     Review of Systems Review of Systems  Constitutional: Negative for fever, chills, diaphoresis, activity change, appetite change, fatigue and unexpected weight change.  HENT: Negative for congestion, sore throat, rhinorrhea, sneezing, trouble swallowing and sinus pressure.  Eyes: Negative for photophobia and visual disturbance.  Respiratory: Negative for cough, chest tightness, shortness of breath, wheezing and stridor.  Cardiovascular: Negative for chest pain, palpitations and leg swelling.    Gastrointestinal: Negative for nausea, vomiting, abdominal pain, diarrhea, constipation, blood in stool, abdominal distention and anal bleeding.  Genitourinary: Negative for dysuria, hematuria, flank pain and difficulty urinating.  Musculoskeletal: Negative for myalgias, back pain, joint swelling, arthralgias and gait problem.  Skin: Negative for color change, pallor, rash and wound.  Neurological: Negative for dizziness, tremors, weakness and light-headedness.  Hematological: Negative for adenopathy. Does not bruise/bleed easily.  Psychiatric/Behavioral: positive for stress/depression   Physical Exam   Wt 227 lb (103 kg)   BMI 32.11 kg/m   Physical Exam  Constitutional: He is oriented to person, place, and time. He appears well-developed and well-nourished. No distress.  HENT:  Mouth/Throat: Oropharynx is clear and moist. No oropharyngeal exudate.  Cardiovascular: Normal rate, regular rhythm and normal heart sounds. Exam reveals no gallop and no friction rub.  No murmur heard.  Pulmonary/Chest: Effort normal and breath sounds normal. No respiratory distress. He has no wheezes.  Abdominal: Soft. Bowel sounds are normal. He exhibits no distension. There is no tenderness.  Lymphadenopathy:  He has no cervical adenopathy.  Neurological: He is alert and oriented to person, place, and time.  Skin: Skin is warm and dry. No rash noted. No erythema.  Psychiatric: He has a normal mood and affect. His behavior is normal.    Lab Results  Component Value Date   CD4TCELL 20 (L) 04/09/2018   Lab Results  Component Value Date   CD4TABS 360 (L) 04/09/2018   CD4TABS 450 01/26/2017   CD4TABS 320 (L) 09/16/2016   Lab Results  Component Value Date   HIV1RNAQUANT 1,180,000 (H) 04/09/2018   No results found for: HEPBSAB Lab Results  Component Value Date  LABRPR NON-REACTIVE 01/26/2017    CBC Lab Results  Component Value Date   WBC 6.3 04/09/2018   RBC 5.09 04/09/2018   HGB 15.2  04/09/2018   HCT 44.3 04/09/2018   PLT 319 04/09/2018   MCV 87.0 04/09/2018   MCH 29.9 04/09/2018   MCHC 34.3 04/09/2018   RDW 11.8 04/09/2018   LYMPHSABS 1,651 04/09/2018   MONOABS 660 08/27/2015   EOSABS 221 04/09/2018    BMET Lab Results  Component Value Date   NA 140 04/09/2018   K 3.8 04/09/2018   CL 103 04/09/2018   CO2 25 04/09/2018   GLUCOSE 103 (H) 04/09/2018   BUN 9 04/09/2018   CREATININE 0.91 04/09/2018   CALCIUM 9.5 04/09/2018   GFRNONAA 90 01/26/2017   GFRAA 104 01/26/2017      Assessment and Plan - return in 6 wk to check labs ( he has been on meds for 2 wk) - refill hiv meds - spent a majority of the time to discuss importance of adherence  - health promotion =flu and pneumonia today  - depression =not interested in taking medication. Offered counseling through clinic  [facility] - gave combo of ibu400 plus tylenol 500

## 2018-05-10 ENCOUNTER — Other Ambulatory Visit: Payer: Self-pay | Admitting: Internal Medicine

## 2018-05-10 DIAGNOSIS — B20 Human immunodeficiency virus [HIV] disease: Secondary | ICD-10-CM

## 2018-05-11 ENCOUNTER — Other Ambulatory Visit: Payer: Self-pay | Admitting: Behavioral Health

## 2018-05-11 DIAGNOSIS — B20 Human immunodeficiency virus [HIV] disease: Secondary | ICD-10-CM

## 2018-05-11 MED ORDER — ELVITEG-COBIC-EMTRICIT-TENOFAF 150-150-200-10 MG PO TABS: 1.0000 | ORAL_TABLET | Freq: Every day | ORAL | 1 refills | Status: DC

## 2018-05-11 MED ORDER — DARUNAVIR ETHANOLATE 800 MG PO TABS: 800.0000 mg | ORAL_TABLET | Freq: Every day | ORAL | 1 refills | Status: DC

## 2018-06-18 ENCOUNTER — Ambulatory Visit: Payer: BLUE CROSS/BLUE SHIELD | Admitting: Family

## 2018-08-24 ENCOUNTER — Other Ambulatory Visit: Payer: Self-pay | Admitting: *Deleted

## 2018-08-24 DIAGNOSIS — B20 Human immunodeficiency virus [HIV] disease: Secondary | ICD-10-CM

## 2018-08-24 MED ORDER — ELVITEG-COBIC-EMTRICIT-TENOFAF 150-150-200-10 MG PO TABS: 1.0000 | ORAL_TABLET | Freq: Every day | ORAL | 0 refills | Status: DC

## 2018-08-24 MED ORDER — DARUNAVIR ETHANOLATE 800 MG PO TABS: 800.0000 mg | ORAL_TABLET | Freq: Every day | ORAL | 0 refills | Status: DC

## 2018-11-16 ENCOUNTER — Other Ambulatory Visit: Payer: Self-pay | Admitting: Internal Medicine

## 2018-11-16 DIAGNOSIS — B20 Human immunodeficiency virus [HIV] disease: Secondary | ICD-10-CM

## 2019-01-07 ENCOUNTER — Other Ambulatory Visit: Payer: Self-pay | Admitting: Internal Medicine

## 2019-01-07 DIAGNOSIS — B20 Human immunodeficiency virus [HIV] disease: Secondary | ICD-10-CM

## 2019-01-08 ENCOUNTER — Other Ambulatory Visit: Payer: Self-pay | Admitting: *Deleted

## 2019-01-08 DIAGNOSIS — B20 Human immunodeficiency virus [HIV] disease: Secondary | ICD-10-CM

## 2019-01-08 MED ORDER — GENVOYA 150-150-200-10 MG PO TABS: 1.0000 | ORAL_TABLET | Freq: Every day | ORAL | 3 refills | Status: DC

## 2019-01-08 MED ORDER — DARUNAVIR ETHANOLATE 800 MG PO TABS: ORAL_TABLET | ORAL | 3 refills | Status: DC

## 2019-02-08 ENCOUNTER — Ambulatory Visit: Payer: BLUE CROSS/BLUE SHIELD

## 2019-02-08 ENCOUNTER — Other Ambulatory Visit: Payer: Self-pay

## 2019-02-12 ENCOUNTER — Ambulatory Visit: Payer: BLUE CROSS/BLUE SHIELD

## 2019-02-12 ENCOUNTER — Other Ambulatory Visit: Payer: Self-pay

## 2019-02-12 DIAGNOSIS — B20 Human immunodeficiency virus [HIV] disease: Secondary | ICD-10-CM

## 2019-02-12 DIAGNOSIS — Z79899 Other long term (current) drug therapy: Secondary | ICD-10-CM

## 2019-02-12 DIAGNOSIS — Z113 Encounter for screening for infections with a predominantly sexual mode of transmission: Secondary | ICD-10-CM

## 2019-02-13 ENCOUNTER — Other Ambulatory Visit: Payer: Self-pay

## 2019-02-13 ENCOUNTER — Other Ambulatory Visit: Payer: Self-pay | Admitting: Internal Medicine

## 2019-02-13 ENCOUNTER — Other Ambulatory Visit: Payer: BLUE CROSS/BLUE SHIELD

## 2019-02-13 DIAGNOSIS — Z113 Encounter for screening for infections with a predominantly sexual mode of transmission: Secondary | ICD-10-CM | POA: Diagnosis not present

## 2019-02-13 DIAGNOSIS — B20 Human immunodeficiency virus [HIV] disease: Secondary | ICD-10-CM

## 2019-02-13 DIAGNOSIS — Z79899 Other long term (current) drug therapy: Secondary | ICD-10-CM | POA: Diagnosis not present

## 2019-02-13 DIAGNOSIS — A539 Syphilis, unspecified: Secondary | ICD-10-CM | POA: Diagnosis not present

## 2019-02-14 LAB — T-HELPER CELL (CD4) - (RCID CLINIC ONLY)
CD4 % Helper T Cell: 24 % — ABNORMAL LOW (ref 33–65)
CD4 T Cell Abs: 483 /uL (ref 400–1790)

## 2019-02-14 LAB — URINE CYTOLOGY ANCILLARY ONLY
Chlamydia: NEGATIVE
Comment: NEGATIVE
Comment: NORMAL
Neisseria Gonorrhea: NEGATIVE

## 2019-02-15 ENCOUNTER — Ambulatory Visit: Payer: BLUE CROSS/BLUE SHIELD

## 2019-02-18 ENCOUNTER — Encounter: Payer: Self-pay | Admitting: Internal Medicine

## 2019-02-20 LAB — CBC WITH DIFFERENTIAL/PLATELET
Absolute Monocytes: 511 cells/uL (ref 200–950)
Basophils Absolute: 43 cells/uL (ref 0–200)
Basophils Relative: 0.6 %
Eosinophils Absolute: 209 cells/uL (ref 15–500)
Eosinophils Relative: 2.9 %
HCT: 44.1 % (ref 38.5–50.0)
Hemoglobin: 14.6 g/dL (ref 13.2–17.1)
Lymphs Abs: 2045 cells/uL (ref 850–3900)
MCH: 29.7 pg (ref 27.0–33.0)
MCHC: 33.1 g/dL (ref 32.0–36.0)
MCV: 89.8 fL (ref 80.0–100.0)
MPV: 9.6 fL (ref 7.5–12.5)
Monocytes Relative: 7.1 %
Neutro Abs: 4392 cells/uL (ref 1500–7800)
Neutrophils Relative %: 61 %
Platelets: 313 10*3/uL (ref 140–400)
RBC: 4.91 10*6/uL (ref 4.20–5.80)
RDW: 12.1 % (ref 11.0–15.0)
Total Lymphocyte: 28.4 %
WBC: 7.2 10*3/uL (ref 3.8–10.8)

## 2019-02-20 LAB — COMPREHENSIVE METABOLIC PANEL
AG Ratio: 1.7 (calc) (ref 1.0–2.5)
ALT: 16 U/L (ref 9–46)
AST: 14 U/L (ref 10–35)
Albumin: 4.4 g/dL (ref 3.6–5.1)
Alkaline phosphatase (APISO): 36 U/L (ref 35–144)
BUN: 11 mg/dL (ref 7–25)
CO2: 24 mmol/L (ref 20–32)
Calcium: 9.3 mg/dL (ref 8.6–10.3)
Chloride: 103 mmol/L (ref 98–110)
Creat: 0.95 mg/dL (ref 0.70–1.33)
Globulin: 2.6 g/dL (calc) (ref 1.9–3.7)
Glucose, Bld: 153 mg/dL — ABNORMAL HIGH (ref 65–99)
Potassium: 4 mmol/L (ref 3.5–5.3)
Sodium: 138 mmol/L (ref 135–146)
Total Bilirubin: 0.3 mg/dL (ref 0.2–1.2)
Total Protein: 7 g/dL (ref 6.1–8.1)

## 2019-02-20 LAB — LIPID PANEL
Cholesterol: 155 mg/dL (ref <200)
HDL: 34 mg/dL — ABNORMAL LOW (ref >=40)
LDL Cholesterol (Calc): 93 mg/dL (calc)
Non-HDL Cholesterol (Calc): 121 mg/dL (calc) (ref <130)
Total CHOL/HDL Ratio: 4.6 (calc) (ref <5.0)
Triglycerides: 186 mg/dL — ABNORMAL HIGH (ref <150)

## 2019-02-20 LAB — RPR: RPR Ser Ql: REACTIVE — AB

## 2019-02-20 LAB — HIV-1 RNA QUANT-NO REFLEX-BLD
HIV 1 RNA Quant: 28 copies/mL — ABNORMAL HIGH
HIV-1 RNA Quant, Log: 1.45 Log copies/mL — ABNORMAL HIGH

## 2019-02-20 LAB — RPR TITER: RPR Titer: 1:1 {titer} — ABNORMAL HIGH

## 2019-02-20 LAB — FLUORESCENT TREPONEMAL AB(FTA)-IGG-BLD: Fluorescent Treponemal ABS: REACTIVE — AB

## 2019-03-01 ENCOUNTER — Other Ambulatory Visit: Payer: Self-pay

## 2019-03-01 ENCOUNTER — Ambulatory Visit: Payer: BLUE CROSS/BLUE SHIELD

## 2019-03-11 ENCOUNTER — Ambulatory Visit (INDEPENDENT_AMBULATORY_CARE_PROVIDER_SITE_OTHER): Payer: BLUE CROSS/BLUE SHIELD | Admitting: Internal Medicine

## 2019-03-11 ENCOUNTER — Other Ambulatory Visit: Payer: Self-pay

## 2019-03-11 ENCOUNTER — Encounter: Payer: Self-pay | Admitting: Internal Medicine

## 2019-03-11 VITALS — BP 138/94 | HR 89 | Temp 97.4°F | Wt 219.6 lb

## 2019-03-11 DIAGNOSIS — A539 Syphilis, unspecified: Secondary | ICD-10-CM | POA: Diagnosis not present

## 2019-03-11 DIAGNOSIS — Z23 Encounter for immunization: Secondary | ICD-10-CM

## 2019-03-11 DIAGNOSIS — B20 Human immunodeficiency virus [HIV] disease: Secondary | ICD-10-CM | POA: Diagnosis not present

## 2019-03-11 MED ORDER — PENICILLIN G BENZATHINE 1200000 UNIT/2ML IM SUSP
1.2000 10*6.[IU] | Freq: Once | INTRAMUSCULAR | Status: AC
  Administered 2019-03-11: 1.2 10*6.[IU] via INTRAMUSCULAR

## 2019-03-11 MED ORDER — PENICILLIN G BENZATHINE 1200000 UNIT/2ML IM SUSP
1.2000 10*6.[IU] | Freq: Once | INTRAMUSCULAR | Status: AC
  Administered 2019-03-11: 12:00:00 1.2 10*6.[IU] via INTRAMUSCULAR

## 2019-03-11 NOTE — Progress Notes (Signed)
RFV: hiv disease follow up Patient ID: Eric Chapman., male   DOB: 1965-07-20, 53 y.o.   MRN: [id number]  HPI Taro is a 53yo M with hiv disease on genvoya-DRV, only missed 1 dose in the last 30 days, CD 4 count of 483/VL 28. 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 He has not had covid-19.   He reports that he believes that he was exposed to STI from one partner since we last saw him in clinic  Outpatient Encounter Medications as of 03/11/2019  Medication Sig  . darunavir (PREZISTA) 800 MG tablet TAKE 1 TABLET (800 MG TOTAL) BY MOUTH DAILY.  Marland Kitchen elvitegravir-cobicistat-emtricitabine-tenofovir (GENVOYA) 150-150-200-10 MG TABS tablet Take 1 tablet by mouth daily with breakfast.   No facility-administered encounter medications on file as of 03/11/2019.     Patient Active Problem List   Diagnosis Date Noted  . Sepsis (HCC) 03/15/2014  . SIRS (systemic inflammatory response syndrome) (HCC) 03/15/2014  . Pyrexia   . HIV (human immunodeficiency virus infection) (HCC)   . Abdominal pain, acute   . Nausea vomiting and diarrhea 03/14/2014  . History of kidney stones 08/12/2011  . Abdominal pain, R sided periumbilical 05/25/2011  . Human immunodeficiency virus (HIV) disease (HCC) 02/10/2006  . 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 ILEUS 02/10/2006     Health Maintenance Due  Topic Date Due  . COLONOSCOPY  03/02/2016  . INFLUENZA VACCINE  10/27/2018     Review of Systems Review of Systems  Constitutional: Negative for fever, chills, diaphoresis, activity change, appetite change, fatigue and unexpected weight change.  HENT: Negative for congestion, sore throat, rhinorrhea, sneezing, trouble swallowing and sinus pressure.  Eyes: Negative for photophobia and visual disturbance.  Respiratory: Negative for cough, chest tightness, shortness of breath, wheezing and stridor.  Cardiovascular: Negative  for chest pain, palpitations and leg swelling.  Gastrointestinal: Negative for nausea, vomiting, abdominal pain, diarrhea, constipation, blood in stool, abdominal distention and anal bleeding.  Genitourinary: Negative for dysuria, hematuria, flank pain and difficulty urinating.  Musculoskeletal: Negative for myalgias, back pain, joint swelling, arthralgias and gait problem.  Skin: Negative for color change, pallor, rash and wound.  Neurological: Negative for dizziness, tremors, weakness and light-headedness.  Hematological: Negative for adenopathy. Does not bruise/bleed easily.  Psychiatric/Behavioral: +stress   Physical Exam   BP (!) 138/94   Pulse 89   Temp (!) 97.4 F (36.3 C) (Oral)   Wt 219 lb 9.6 oz (99.6 kg)   BMI 31.06 kg/m   Physical Exam  Constitutional: He is oriented to person, place, and time. He appears well-developed and well-nourished. No distress.  HENT:  Mouth/Throat: Oropharynx is clear and moist. No oropharyngeal exudate.  Cardiovascular: Normal rate, regular rhythm and normal heart sounds. Exam reveals no gallop and no friction rub.  No murmur heard.  Pulmonary/Chest: Effort normal and breath sounds normal. No respiratory distress. He has no wheezes.  Abdominal: Soft. Bowel sounds are normal. He exhibits no distension. There is no tenderness.  Lymphadenopathy:  He has no cervical adenopathy.  Neurological: He is alert and oriented to person, place, and time.  Skin: Skin is warm and dry. No rash noted. No erythema.  Psychiatric: He has a normal mood and affect. His behavior is normal.    Lab Results  Component Value Date   CD4TCELL 24 (L) 02/13/2019   Lab Results  Component Value Date   CD4TABS 483 02/13/2019  CD4TABS 360 (L) 04/09/2018   CD4TABS 450 01/26/2017   Lab Results  Component Value Date   HIV1RNAQUANT 28 (H) 02/13/2019   No results found for: HEPBSAB Lab Results  Component Value Date   LABRPR REACTIVE (A) 02/13/2019    CBC Lab  Results  Component Value Date   WBC 7.2 02/13/2019   RBC 4.91 02/13/2019   HGB 14.6 02/13/2019   HCT 44.1 02/13/2019   PLT 313 02/13/2019   MCV 89.8 02/13/2019   MCH 29.7 02/13/2019   MCHC 33.1 02/13/2019   RDW 12.1 02/13/2019   LYMPHSABS 2,045 02/13/2019   MONOABS 660 08/27/2015   EOSABS 209 02/13/2019    BMET Lab Results  Component Value Date   NA 138 02/13/2019   K 4.0 02/13/2019   CL 103 02/13/2019   CO2 24 02/13/2019   GLUCOSE 153 (H) 02/13/2019   BUN 11 02/13/2019   CREATININE 0.95 02/13/2019   CALCIUM 9.3 02/13/2019   GFRNONAA 90 01/26/2017   GFRAA 104 01/26/2017      Assessment and Plan  Possible re exposure to syphilis. Low titer. wll re treat given has had 1 partner in the last 2 months, who also had STI  Will test for sti  Continue with hiv = well controlled, will refill meds  Health maintenance = will give flu vaccine today.

## 2019-03-12 LAB — URINE CYTOLOGY ANCILLARY ONLY
Chlamydia: NEGATIVE
Comment: NEGATIVE
Comment: NORMAL
Neisseria Gonorrhea: NEGATIVE

## 2019-03-12 LAB — CYTOLOGY, (ORAL, ANAL, URETHRAL) ANCILLARY ONLY
Chlamydia: NEGATIVE
Chlamydia: NEGATIVE
Comment: NEGATIVE
Comment: NEGATIVE
Comment: NORMAL
Comment: NORMAL
Neisseria Gonorrhea: NEGATIVE
Neisseria Gonorrhea: NEGATIVE

## 2019-03-15 ENCOUNTER — Telehealth: Payer: Self-pay

## 2019-03-15 NOTE — Telephone Encounter (Signed)
Patient called office today to follow up on STD labs done Monday 12/14. Informed patient that test came back negative,and results should be available on mychart.  Eric Chapman

## 2019-05-03 ENCOUNTER — Ambulatory Visit: Payer: Self-pay

## 2019-05-20 ENCOUNTER — Telehealth: Payer: Self-pay | Admitting: *Deleted

## 2019-05-20 NOTE — Telephone Encounter (Signed)
Patient left message in triage this morning stating he has new insurance, needs to give [place] the information and have [place] send in refills to his new pharmacy. RN returned the call, but call dropped before patient could relay any information.  RN called back, went to voicemail.  Left message asking him to please call back with this information. Andree Coss, RN

## 2019-06-11 ENCOUNTER — Other Ambulatory Visit: Payer: Self-pay | Admitting: *Deleted

## 2019-06-11 DIAGNOSIS — B20 Human immunodeficiency virus [HIV] disease: Secondary | ICD-10-CM

## 2019-06-11 NOTE — Progress Notes (Unsigned)
Patient has market Conservation officer, nature, is unsure where he needs to have medications sent. He will contact them and ask for their pharmacy requirement. Andree Coss, RN

## 2019-06-24 ENCOUNTER — Telehealth: Payer: Self-pay

## 2019-06-24 NOTE — Telephone Encounter (Signed)
COVID-19 Pre-Screening Questions:06/24/19  Do you currently have a fever (>100 F), chills or unexplained body aches?NO   Are you currently experiencing new cough, shortness of breath, sore throat, runny nose?NO  .  Have you recently travelled outside the state of [place] in the last 14 days? NO .  Have you been in contact with someone that is currently pending confirmation of Covid19 testing or has been confirmed to have the Covid19 virus?  NO  **If the patient answers NO to ALL questions -  advise the patient to please call the clinic before coming to the office should any symptoms develop.     

## 2019-06-25 ENCOUNTER — Encounter: Payer: Self-pay | Admitting: Internal Medicine

## 2019-06-25 ENCOUNTER — Ambulatory Visit: Payer: Self-pay

## 2019-06-25 ENCOUNTER — Other Ambulatory Visit: Payer: Self-pay

## 2019-07-15 ENCOUNTER — Other Ambulatory Visit: Payer: Self-pay

## 2019-07-15 ENCOUNTER — Other Ambulatory Visit: Payer: BLUE CROSS/BLUE SHIELD

## 2019-07-15 DIAGNOSIS — B20 Human immunodeficiency virus [HIV] disease: Secondary | ICD-10-CM

## 2019-07-23 ENCOUNTER — Other Ambulatory Visit: Payer: PRIVATE HEALTH INSURANCE

## 2019-07-23 ENCOUNTER — Other Ambulatory Visit: Payer: Self-pay

## 2019-07-23 DIAGNOSIS — B20 Human immunodeficiency virus [HIV] disease: Secondary | ICD-10-CM

## 2019-07-24 LAB — T-HELPER CELL (CD4) - (RCID CLINIC ONLY)
CD4 % Helper T Cell: 18 % — ABNORMAL LOW (ref 33–65)
CD4 T Cell Abs: 313 /uL — ABNORMAL LOW (ref 400–1790)

## 2019-07-26 LAB — COMPREHENSIVE METABOLIC PANEL
AG Ratio: 1.4 (calc) (ref 1.0–2.5)
ALT: 21 U/L (ref 9–46)
AST: 17 U/L (ref 10–35)
Albumin: 4.2 g/dL (ref 3.6–5.1)
Alkaline phosphatase (APISO): 37 U/L (ref 35–144)
BUN: 11 mg/dL (ref 7–25)
CO2: 25 mmol/L (ref 20–32)
Calcium: 9.4 mg/dL (ref 8.6–10.3)
Chloride: 102 mmol/L (ref 98–110)
Creat: 0.82 mg/dL (ref 0.70–1.33)
Globulin: 2.9 g/dL (calc) (ref 1.9–3.7)
Glucose, Bld: 92 mg/dL (ref 65–99)
Potassium: 3.7 mmol/L (ref 3.5–5.3)
Sodium: 136 mmol/L (ref 135–146)
Total Bilirubin: 0.3 mg/dL (ref 0.2–1.2)
Total Protein: 7.1 g/dL (ref 6.1–8.1)

## 2019-07-26 LAB — CBC WITH DIFFERENTIAL/PLATELET
Absolute Monocytes: 795 cells/uL (ref 200–950)
Basophils Absolute: 28 cells/uL (ref 0–200)
Basophils Relative: 0.4 %
Eosinophils Absolute: 277 cells/uL (ref 15–500)
Eosinophils Relative: 3.9 %
HCT: 43.4 % (ref 38.5–50.0)
Hemoglobin: 14.7 g/dL (ref 13.2–17.1)
Lymphs Abs: 1697 cells/uL (ref 850–3900)
MCH: 29.2 pg (ref 27.0–33.0)
MCHC: 33.9 g/dL (ref 32.0–36.0)
MCV: 86.1 fL (ref 80.0–100.0)
MPV: 9.5 fL (ref 7.5–12.5)
Monocytes Relative: 11.2 %
Neutro Abs: 4303 cells/uL (ref 1500–7800)
Neutrophils Relative %: 60.6 %
Platelets: 284 10*3/uL (ref 140–400)
RBC: 5.04 10*6/uL (ref 4.20–5.80)
RDW: 11.7 % (ref 11.0–15.0)
Total Lymphocyte: 23.9 %
WBC: 7.1 10*3/uL (ref 3.8–10.8)

## 2019-07-26 LAB — HIV-1 RNA QUANT-NO REFLEX-BLD
HIV 1 RNA Quant: 64200 copies/mL — ABNORMAL HIGH
HIV-1 RNA Quant, Log: 4.81 Log copies/mL — ABNORMAL HIGH

## 2019-07-31 ENCOUNTER — Other Ambulatory Visit (HOSPITAL_COMMUNITY): Payer: Self-pay | Admitting: Internal Medicine

## 2019-07-31 ENCOUNTER — Telehealth: Payer: Self-pay | Admitting: Pharmacy Technician

## 2019-07-31 ENCOUNTER — Other Ambulatory Visit: Payer: Self-pay

## 2019-07-31 ENCOUNTER — Ambulatory Visit (INDEPENDENT_AMBULATORY_CARE_PROVIDER_SITE_OTHER): Payer: PRIVATE HEALTH INSURANCE | Admitting: Internal Medicine

## 2019-07-31 ENCOUNTER — Encounter: Payer: Self-pay | Admitting: Internal Medicine

## 2019-07-31 VITALS — BP 148/91 | HR 77 | Temp 98.7°F

## 2019-07-31 DIAGNOSIS — B20 Human immunodeficiency virus [HIV] disease: Secondary | ICD-10-CM

## 2019-07-31 DIAGNOSIS — A539 Syphilis, unspecified: Secondary | ICD-10-CM

## 2019-07-31 MED ORDER — DARUNAVIR ETHANOLATE 800 MG PO TABS: ORAL_TABLET | ORAL | 5 refills | Status: DC

## 2019-07-31 MED ORDER — GENVOYA 150-150-200-10 MG PO TABS: 1.0000 | ORAL_TABLET | Freq: Every day | ORAL | 5 refills | Status: DC

## 2019-07-31 NOTE — Progress Notes (Signed)
RFV: follow up for hiv disease  Patient ID: Eric Chapman., male   DOB: May 10, 1965, 54 y.o.   MRN: [id number]  HPI Wilborn is a 54yo M with poorly controlled hiv disease, cd 4 count of 313/VL 64,000 previously on genvoya-DRV.   [place] [facility] [place] [facility]. And got the [place] and johnson 2 months ago.  Outpatient Encounter Medications as of 07/31/2019  Medication Sig  . darunavir (PREZISTA) 800 MG tablet TAKE 1 TABLET (800 MG TOTAL) BY MOUTH DAILY.  Marland Kitchen elvitegravir-cobicistat-emtricitabine-tenofovir (GENVOYA) 150-150-200-10 MG TABS tablet Take 1 tablet by mouth daily with breakfast.   No facility-administered encounter medications on file as of 07/31/2019.     Patient Active Problem List   Diagnosis Date Noted  . Sepsis (HCC) 03/15/2014  . SIRS (systemic inflammatory response syndrome) (HCC) 03/15/2014  . Pyrexia   . HIV (human immunodeficiency virus infection) (HCC)   . Abdominal pain, acute   . Nausea vomiting and diarrhea 03/14/2014  . History of kidney stones 08/12/2011  . Abdominal pain, R sided periumbilical 05/25/2011  . Human immunodeficiency virus (HIV) disease (HCC) 02/10/2006  . 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 ILEUS 02/10/2006     Health Maintenance Due  Topic Date Due  . COVID-19 Vaccine (1) Never done  . COLONOSCOPY  Never done     Review of Systems  Physical Exam   There were no vitals taken for this visit.   Lab Results  Component Value Date   CD4TCELL 18 (L) 07/23/2019   Lab Results  Component Value Date   CD4TABS 313 (L) 07/23/2019   CD4TABS 483 02/13/2019   CD4TABS 360 (L) 04/09/2018   Lab Results  Component Value Date   HIV1RNAQUANT 64,200 (H) 07/23/2019   No results found for: HEPBSAB Lab Results  Component Value Date   LABRPR REACTIVE (A) 02/13/2019    CBC Lab Results  Component Value Date   WBC 7.1 07/23/2019   RBC 5.04 07/23/2019   HGB 14.7 07/23/2019   HCT 43.4 07/23/2019   PLT 284 07/23/2019   MCV 86.1 07/23/2019   MCH 29.2 07/23/2019   MCHC 33.9 07/23/2019   RDW 11.7 07/23/2019   LYMPHSABS 1,697 07/23/2019   MONOABS 660 08/27/2015   EOSABS 277 07/23/2019    BMET Lab Results  Component Value Date   NA 136 07/23/2019   K 3.7 07/23/2019   CL 102 07/23/2019   CO2 25 07/23/2019   GLUCOSE 92 07/23/2019   BUN 11 07/23/2019   CREATININE 0.82 07/23/2019   CALCIUM 9.4 07/23/2019   GFRNONAA 90 01/26/2017   GFRAA 104 01/26/2017      Assessment and Plan  hiv disease = off of meds, but willing to restart genvoya and darunavir  Hx of syphilis = rpr to check response since had 1:1 - in November - since he had new partner and wondered if he got exposure  New insurance = new copay card

## 2019-07-31 NOTE — Telephone Encounter (Signed)
[facility] Patient Advocate Encounter  Patient's medications will be couriered to [facility] from [facility] pharmacy and will be picked up at appointment with pharmacist.    We will need to schedule with him for next week, Tuesday, Wednesday or Thursday.  Micael Hampshire has ordered a month at a time pill tray for him to use.  We will fill the pill tray for him.  I tried to reach him, no answer and voice mail box was full.  We will continue to follow.  Netty Starring. Dimas Aguas CPhT Specialty Pharmacy Patient [facility] for Infectious Disease Phone: [phone] Fax:  [phone]

## 2019-07-31 NOTE — Telephone Encounter (Signed)
[facility] Patient Advocate Encounter   Was successful in obtaining a Eric Chapman copay card for [employer].  This copay card will make the patients copay $0.  The billing information is as follows and has been shared with Eric Chapman.  RxBin: [id number] PCN: [id number] Member ID: [id number] Group ID: [id number]    Was successful in obtaining a [place] copay card for [facility].  This copay card will make the patients copay $0.   The billing information is as follows and has been shared with Eric Chapman.  RxBin: [id number] PCN: [id number] Member ID: [id number] Group ID: [id number]  Eric Chapman CPhT Specialty [facility] Patient Eric Chapman for Infectious Disease Phone: [phone] Fax:  [phone]

## 2019-08-01 LAB — RPR: RPR Ser Ql: REACTIVE — AB

## 2019-08-01 LAB — FLUORESCENT TREPONEMAL AB(FTA)-IGG-BLD: Fluorescent Treponemal ABS: REACTIVE — AB

## 2019-08-01 LAB — RPR TITER: RPR Titer: 1:1 {titer} — ABNORMAL HIGH

## 2019-08-01 NOTE — Telephone Encounter (Signed)
Thanks Dorie!

## 2019-08-01 NOTE — Telephone Encounter (Signed)
Patient returned missed call from clinic yesterday. Scheduled to visit with pharmacy next week on Tuesday pick up meds and work with pharmacy team on filling pill tray.   Lorenz Donley Loyola Mast, RN

## 2019-08-02 MED FILL — GENVOYA TABLET: 150-150-200 | 30 days supply | Qty: 30 | Fill #0

## 2019-08-02 MED FILL — PREZISTA 800 MG TABS: 800 | 30 days supply | Qty: 30 | Fill #0

## 2019-08-05 ENCOUNTER — Telehealth: Payer: Self-pay | Admitting: Pharmacy Technician

## 2019-08-05 NOTE — Telephone Encounter (Signed)
[facility] Patient Advocate Encounter  Patient's medications have been couriered to [facility] from [facility] pharmacy and will be picked up 08/06/2019.  Netty Starring. Dimas Aguas CPhT Specialty Pharmacy Patient [facility] for Infectious Disease Phone: [phone] Fax:  [phone]

## 2019-08-06 ENCOUNTER — Other Ambulatory Visit: Payer: Self-pay

## 2019-08-06 ENCOUNTER — Ambulatory Visit (INDEPENDENT_AMBULATORY_CARE_PROVIDER_SITE_OTHER): Payer: PRIVATE HEALTH INSURANCE | Admitting: Pharmacist

## 2019-08-06 DIAGNOSIS — B20 Human immunodeficiency virus [HIV] disease: Secondary | ICD-10-CM

## 2019-08-06 NOTE — Progress Notes (Addendum)
I have reviewed the Infectious Disease Clinical Pharmacist's note above. I agree with the assessment and plan as outlined in Cassie Kuppelweiser's note.    I have reviewed the Infectious Disease Clinical Pharmacist's note above. I agree with the assessment and plan as outlined in Cassie Kuppelweiser's note as well as that of [employer].     HPI: Eric Chapman. is a 54 y.o. male who presents to the [place] clinic for HIV follow-up.  Patient Active Problem List   Diagnosis Date Noted  . Sepsis ([place]) 03/15/2014  . SIRS (systemic inflammatory response syndrome) ([place]) 03/15/2014  . Pyrexia   . HIV (human immunodeficiency virus infection) ([place])   . Abdominal pain, acute   . Nausea vomiting and diarrhea 03/14/2014  . History of kidney stones 08/12/2011  . Abdominal pain, R sided periumbilical 93/79/0240  . Human immunodeficiency virus (HIV) disease ([place]) 02/10/2006  . 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 ILEUS 02/10/2006    Patient's Medications  New Prescriptions   No medications on file  Previous Medications   DARUNAVIR (PREZISTA) 800 MG TABLET    TAKE 1 TABLET (800 MG TOTAL) BY MOUTH DAILY.   ELVITEGRAVIR-COBICISTAT-EMTRICITABINE-TENOFOVIR (GENVOYA) 150-150-200-10 MG TABS TABLET    Take 1 tablet by mouth daily with breakfast.  Modified Medications   No medications on file  Discontinued Medications   No medications on file    Allergies: Allergies  Allergen Reactions  . Percocet [Oxycodone-Acetaminophen] Itching and Other (See Comments)    Extreme agitation    Past Medical History: Past Medical History:  Diagnosis Date  . HIV infection ([place])   . RLQ abdominal pain     Social History: Social History   Socioeconomic History  . Marital status: Single    Spouse name: Not on file  . Number of children: Not on file  . Years of education: Not on file  . Highest education level: Not on file  Occupational History  . Not on file    Tobacco Use  . Smoking status: Never Smoker  . Smokeless tobacco: Never Used  Substance and Sexual Activity  . Alcohol use: Yes    Alcohol/week: 0.0 standard drinks    Comment: social  . Drug use: No  . Sexual activity: Yes    Comment: decined condoms  Other Topics Concern  . Not on file  Social History Narrative  . Not on file   Social Determinants of Health   Financial Resource Strain:   . Difficulty of Paying Living Expenses:   Food Insecurity:   . Worried About Charity fundraiser in the Last Year:   . Arboriculturist in the Last Year:   Transportation Needs:   . Film/video editor (Medical):   Marland Kitchen Lack of Transportation (Non-Medical):   Physical Activity:   . Days of Exercise per Week:   . Minutes of Exercise per Session:   Stress:   . Feeling of Stress :   Social Connections:   . Frequency of Communication with Friends and Family:   . Frequency of Social Gatherings with Friends and Family:   . Attends Religious Services:   . Active Member of Clubs or Organizations:   . Attends Archivist Meetings:   Marland Kitchen Marital Status:     Labs: Lab Results  Component Value Date   HIV1RNAQUANT 64,200 (H) 07/23/2019   HIV1RNAQUANT 28 (H) 02/13/2019   HIV1RNAQUANT 1,180,000 (H) 04/09/2018   CD4TABS 313 (L) 07/23/2019  CD4TABS 483 02/13/2019   CD4TABS 360 (L) 04/09/2018    RPR and STI Lab Results  Component Value Date   LABRPR REACTIVE (A) 07/31/2019   LABRPR REACTIVE (A) 02/13/2019   LABRPR NON-REACTIVE 01/26/2017   LABRPR NON REAC 09/16/2016   LABRPR NON REAC 10/15/2015   RPRTITER 1:1 (H) 07/31/2019   RPRTITER 1:1 (H) 02/13/2019   RPRTITER 1:1 08/27/2015    STI Results GC CT  03/11/2019 Negative Negative  03/11/2019 Negative Negative  03/11/2019 Negative Negative  02/13/2019 Negative Negative  09/16/2016 Negative Negative  09/16/2016 Negative Negative  06/26/2014 Negative Negative  06/10/2013 NG: Negative CT: Negative    Hepatitis B Lab Results   Component Value Date   HEPBSAG NEGATIVE 04/06/2012   Hepatitis C No results found for: HEPCAB, HCVRNAPCRQN Hepatitis A No results found for: HAV Lipids: Lab Results  Component Value Date   CHOL 155 02/13/2019   TRIG 186 (H) 02/13/2019   HDL 34 (L) 02/13/2019   CHOLHDL 4.6 02/13/2019   VLDL 28 08/27/2015   LDLCALC 93 02/13/2019    Current HIV Regimen: Genvoya and Prezista  Assessment: [name] is here today to pick up his medications which we will be filling in a pill box for him to help with his adherence. I spoke with him about whether or not it would be helpful to have [place] fill his medication box for him monthly. He does not wish to have an appointment with [place] monthly and says he is capable of using and filling this pill box on his own as long as he can get his medications. He says his preferred method is to have the medications shipped to his post office box. We will set up to have his medications shipped from [facility] outpatient pharmacy to his post office box [phone] in [place]. I told him we would be happy to see him monthly if he thought it would help his adherence, but for now he will continue to just see Dr. Drue Second.   Plan: - Continue Genvoya and Prezista - Follow up with Dr. Drue Second 10/09/19   Jettie Pagan, PharmD PGY2 Infectious Disease Pharmacy Resident  [facility] 08/06/2019, 11:05 AM

## 2019-08-27 MED FILL — GENVOYA TABLET: 150-150-200 | 30 days supply | Qty: 30 | Fill #1

## 2019-08-27 MED FILL — PREZISTA 800 MG TABS: 800 | 30 days supply | Qty: 30 | Fill #1

## 2019-09-24 MED FILL — PREZISTA 800 MG TABS: 800 | 30 days supply | Qty: 30 | Fill #2

## 2019-09-24 MED FILL — GENVOYA TABLET: 150-150-200 | 30 days supply | Qty: 30 | Fill #2

## 2019-10-09 ENCOUNTER — Ambulatory Visit: Payer: PRIVATE HEALTH INSURANCE | Admitting: Internal Medicine

## 2019-10-24 ENCOUNTER — Telehealth: Payer: Self-pay | Admitting: Pharmacy Technician

## 2019-10-24 NOTE — Telephone Encounter (Signed)
[facility] Patient Advocate Encounter  Patient stated to [facility] pharmacy that he would pick up Prezista and Genvoya.  He did not and the medications were returned to stock.  I tried to reach him and left HIPPA compliant voicemail.  [facility] pharmacy phone number is [phone].   Netty Starring. Dimas Aguas CPhT Specialty Pharmacy Patient [facility] for Infectious Disease Phone: [phone] Fax:  [phone]

## 2019-10-29 MED FILL — PREZISTA 800 MG TABS: 800 | 30 days supply | Qty: 30 | Fill #2

## 2019-10-29 MED FILL — GENVOYA TABLET: 150-150-200 | 30 days supply | Qty: 30 | Fill #2

## 2019-11-25 MED FILL — GENVOYA TABLET: 150-150-200 | 30 days supply | Qty: 30 | Fill #3

## 2019-11-25 MED FILL — PREZISTA 800 MG TABS: 800 | 30 days supply | Qty: 30 | Fill #3

## 2019-12-24 MED FILL — GENVOYA TABLET: 150-150-200 | 30 days supply | Qty: 30 | Fill #3

## 2019-12-24 MED FILL — PREZISTA 800 MG TABS: 800 | 30 days supply | Qty: 30 | Fill #3

## 2020-01-01 MED FILL — GENVOYA TABLET: 150-150-200 | 30 days supply | Qty: 30 | Fill #3

## 2020-01-01 MED FILL — PREZISTA 800 MG TABS: 800 | 30 days supply | Qty: 30 | Fill #3

## 2020-01-08 ENCOUNTER — Ambulatory Visit: Payer: PRIVATE HEALTH INSURANCE

## 2020-01-08 ENCOUNTER — Other Ambulatory Visit: Payer: Self-pay

## 2020-01-09 ENCOUNTER — Encounter: Payer: Self-pay | Admitting: Internal Medicine

## 2020-01-31 ENCOUNTER — Telehealth: Payer: Self-pay

## 2020-01-31 NOTE — Telephone Encounter (Signed)
[facility] Patient Advocate Encounter  [facility] specialty pharmacy and I have been unsuccsessful in reaching patient to be able to refill medication.    We have tried multiple times without a response.  Calynn Ferrero Gagliano, CPhT Specialty Pharmacy Patient [facility] Phone: [phone] Fax:  [phone]  

## 2020-02-27 MED FILL — PREZISTA 800 MG TABS: 800 | 30 days supply | Qty: 30 | Fill #4

## 2020-02-27 MED FILL — GENVOYA TABLET: 150-150-200 | 30 days supply | Qty: 30 | Fill #4

## 2020-03-17 MED FILL — PREZISTA 800 MG TABS: 800 | 30 days supply | Qty: 30 | Fill #4

## 2020-03-17 MED FILL — GENVOYA TABLET: 150-150-200 | 30 days supply | Qty: 30 | Fill #4

## 2020-04-09 ENCOUNTER — Ambulatory Visit (INDEPENDENT_AMBULATORY_CARE_PROVIDER_SITE_OTHER): Payer: BC Managed Care – PPO | Admitting: Internal Medicine

## 2020-04-09 ENCOUNTER — Ambulatory Visit
Admission: RE | Admit: 2020-04-09 | Discharge: 2020-04-09 | Disposition: A | Discharge status: Home / Self Care | Payer: BC Managed Care – PPO | Source: Ambulatory Visit | Attending: Internal Medicine | Admitting: Internal Medicine

## 2020-04-09 ENCOUNTER — Encounter: Payer: Self-pay | Admitting: Internal Medicine

## 2020-04-09 ENCOUNTER — Other Ambulatory Visit: Payer: Self-pay

## 2020-04-09 ENCOUNTER — Other Ambulatory Visit (HOSPITAL_COMMUNITY)
Admission: RE | Admit: 2020-04-09 | Discharge: 2020-04-09 | Disposition: A | Discharge status: Home / Self Care | Payer: BC Managed Care – PPO | Source: Ambulatory Visit | Attending: Internal Medicine | Admitting: Internal Medicine

## 2020-04-09 VITALS — BP 155/88 | HR 87 | Temp 98.3°F | Wt 231.0 lb

## 2020-04-09 DIAGNOSIS — F320 Major depressive disorder, single episode, mild: Secondary | ICD-10-CM

## 2020-04-09 DIAGNOSIS — R0600 Dyspnea, unspecified: Secondary | ICD-10-CM

## 2020-04-09 DIAGNOSIS — B20 Human immunodeficiency virus [HIV] disease: Secondary | ICD-10-CM | POA: Diagnosis not present

## 2020-04-09 DIAGNOSIS — Z91199 Patient's noncompliance with other medical treatment and regimen due to unspecified reason: Secondary | ICD-10-CM

## 2020-04-09 DIAGNOSIS — Z9119 Patient's noncompliance with other medical treatment and regimen: Secondary | ICD-10-CM

## 2020-04-09 DIAGNOSIS — R0602 Shortness of breath: Secondary | ICD-10-CM | POA: Diagnosis not present

## 2020-04-09 NOTE — Progress Notes (Signed)
   RFV: follow up for hiv disease  Patient ID: Eric Chapman., male   DOB: 01/18/1966, 55 y.o.   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   Only received the j and j vaccine, followed by moderna booster roughly 4 weeks ago  . Loss many relatives in past 7 months -  4 deaths   Financial stressors  Wants to get checked for STI. -swab all areas; same sexual partner for the last 2 years, last together 1-2 months ago.    Outpatient Encounter Medications as of 04/09/2020  Medication Sig  . darunavir (PREZISTA) 800 MG tablet TAKE 1 TABLET (800 MG TOTAL) BY MOUTH DAILY.  Marland Kitchen elvitegravir-cobicistat-emtricitabine-tenofovir (GENVOYA) 150-150-200-10 MG TABS tablet Take 1 tablet by mouth daily with breakfast.   No facility-administered encounter medications on file as of 04/09/2020.     Patient Active Problem List   Diagnosis Date Noted  . Sepsis (HCC) 03/15/2014  . SIRS (systemic inflammatory response syndrome) (HCC) 03/15/2014  . Pyrexia   . HIV (human immunodeficiency virus infection) (HCC)   . Abdominal pain, acute   . Nausea vomiting and diarrhea 03/14/2014  . History of kidney stones 08/12/2011  . Abdominal pain, R sided periumbilical 05/25/2011  . Human immunodeficiency virus (HIV) disease (HCC) 02/10/2006  . 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 ILEUS 02/10/2006     Health Maintenance Due  Topic Date Due  . COLONOSCOPY (Pts 45-38yrs Insurance coverage will need to be confirmed)  Never done  . COVID-19 Vaccine (2 - Booster for [employer] series) 07/26/2019  . INFLUENZA VACCINE  10/27/2019     Review of Systems 10 point ros is negative except for depression/stress Physical Exam   Wt 231 lb (104.8 kg)   SpO2 97%   BMI 32.68 kg/m   .idepm  Lab Results  Component Value Date   CD4TCELL 18 (L) 07/23/2019   Lab  Results  Component Value Date   CD4TABS 313 (L) 07/23/2019   CD4TABS 483 02/13/2019   CD4TABS 360 (L) 04/09/2018   Lab Results  Component Value Date   HIV1RNAQUANT 64,200 (H) 07/23/2019   No results found for: HEPBSAB Lab Results  Component Value Date   LABRPR REACTIVE (A) 07/31/2019    CBC Lab Results  Component Value Date   WBC 7.1 07/23/2019   RBC 5.04 07/23/2019   HGB 14.7 07/23/2019   HCT 43.4 07/23/2019   PLT 284 07/23/2019   MCV 86.1 07/23/2019   MCH 29.2 07/23/2019   MCHC 33.9 07/23/2019   RDW 11.7 07/23/2019   LYMPHSABS 1,697 07/23/2019   MONOABS 660 08/27/2015   EOSABS 277 07/23/2019    BMET Lab Results  Component Value Date   NA 136 07/23/2019   K 3.7 07/23/2019   CL 102 07/23/2019   CO2 25 07/23/2019   GLUCOSE 92 07/23/2019   BUN 11 07/23/2019   CREATININE 0.82 07/23/2019   CALCIUM 9.4 07/23/2019   GFRNONAA 90 01/26/2017   GFRAA 104 01/26/2017    Needs PCP  Assessment and Plan hiv disease- will check labs, continued on adherence counseling  Health maintenance- defer flu vaccine ( to get at [facility])  DOE =will check cx  fornew complaint DOE, concern for oi given has been off of medications

## 2020-04-10 LAB — T-HELPER CELL (CD4) - (RCID CLINIC ONLY)
CD4 % Helper T Cell: 24 % — ABNORMAL LOW (ref 33–65)
CD4 T Cell Abs: 526 /uL (ref 400–1790)

## 2020-04-13 LAB — CYTOLOGY, (ORAL, ANAL, URETHRAL) ANCILLARY ONLY
Chlamydia: NEGATIVE
Chlamydia: NEGATIVE
Comment: NEGATIVE
Comment: NEGATIVE
Comment: NORMAL
Comment: NORMAL
Neisseria Gonorrhea: NEGATIVE
Neisseria Gonorrhea: NEGATIVE

## 2020-04-13 LAB — URINE CYTOLOGY ANCILLARY ONLY
Chlamydia: NEGATIVE
Comment: NEGATIVE
Comment: NORMAL
Neisseria Gonorrhea: NEGATIVE

## 2020-04-14 LAB — LIPID PANEL
Cholesterol: 140 mg/dL (ref <200)
HDL: 24 mg/dL — ABNORMAL LOW (ref >=40)
LDL Cholesterol (Calc): 79 mg/dL (calc)
Non-HDL Cholesterol (Calc): 116 mg/dL (calc) (ref <130)
Total CHOL/HDL Ratio: 5.8 (calc) — ABNORMAL HIGH (ref <5.0)
Triglycerides: 281 mg/dL — ABNORMAL HIGH (ref <150)

## 2020-04-14 LAB — CBC WITH DIFFERENTIAL/PLATELET
Absolute Monocytes: 936 cells/uL (ref 200–950)
Basophils Absolute: 78 cells/uL (ref 0–200)
Basophils Relative: 0.6 %
Eosinophils Absolute: 195 cells/uL (ref 15–500)
Eosinophils Relative: 1.5 %
HCT: 45.4 % (ref 38.5–50.0)
Hemoglobin: 15.1 g/dL (ref 13.2–17.1)
Lymphs Abs: 2223 cells/uL (ref 850–3900)
MCH: 28.9 pg (ref 27.0–33.0)
MCHC: 33.3 g/dL (ref 32.0–36.0)
MCV: 87 fL (ref 80.0–100.0)
MPV: 8.9 fL (ref 7.5–12.5)
Monocytes Relative: 7.2 %
Neutro Abs: 9568 cells/uL — ABNORMAL HIGH (ref 1500–7800)
Neutrophils Relative %: 73.6 %
Platelets: 456 10*3/uL — ABNORMAL HIGH (ref 140–400)
RBC: 5.22 10*6/uL (ref 4.20–5.80)
RDW: 12.1 % (ref 11.0–15.0)
Total Lymphocyte: 17.1 %
WBC: 13 10*3/uL — ABNORMAL HIGH (ref 3.8–10.8)

## 2020-04-14 LAB — HIV-1 RNA QUANT-NO REFLEX-BLD
HIV 1 RNA Quant: 99 Copies/mL — ABNORMAL HIGH
HIV-1 RNA Quant, Log: 1.99 Log cps/mL — ABNORMAL HIGH

## 2020-04-14 LAB — COMPLETE METABOLIC PANEL WITH GFR
AG Ratio: 1.4 (calc) (ref 1.0–2.5)
ALT: 24 U/L (ref 9–46)
AST: 16 U/L (ref 10–35)
Albumin: 4.2 g/dL (ref 3.6–5.1)
Alkaline phosphatase (APISO): 38 U/L (ref 35–144)
BUN: 10 mg/dL (ref 7–25)
CO2: 29 mmol/L (ref 20–32)
Calcium: 9.6 mg/dL (ref 8.6–10.3)
Chloride: 102 mmol/L (ref 98–110)
Creat: 0.83 mg/dL (ref 0.70–1.33)
GFR, Est African American: 116 mL/min/{1.73_m2} (ref >=60)
GFR, Est Non African American: 100 mL/min/{1.73_m2} (ref >=60)
Globulin: 3 g/dL (calc) (ref 1.9–3.7)
Glucose, Bld: 83 mg/dL (ref 65–99)
Potassium: 4 mmol/L (ref 3.5–5.3)
Sodium: 139 mmol/L (ref 135–146)
Total Bilirubin: 0.2 mg/dL (ref 0.2–1.2)
Total Protein: 7.2 g/dL (ref 6.1–8.1)

## 2020-04-14 LAB — RPR TITER: RPR Titer: 1:1 {titer} — ABNORMAL HIGH

## 2020-04-14 LAB — FLUORESCENT TREPONEMAL AB(FTA)-IGG-BLD: Fluorescent Treponemal ABS: REACTIVE — AB

## 2020-04-14 LAB — RPR: RPR Ser Ql: REACTIVE — AB

## 2020-04-16 MED FILL — PREZISTA 800 MG TABS: 800 | 30 days supply | Qty: 30 | Fill #5

## 2020-04-16 MED FILL — GENVOYA TABLET: 150-150-200 | 30 days supply | Qty: 30 | Fill #5

## 2020-05-10 DIAGNOSIS — J0100 Acute maxillary sinusitis, unspecified: Secondary | ICD-10-CM | POA: Diagnosis not present

## 2020-05-10 DIAGNOSIS — R059 Cough, unspecified: Secondary | ICD-10-CM | POA: Diagnosis not present

## 2020-05-11 MED FILL — GENVOYA TABLET: 150-150-200 | 30 days supply | Qty: 30 | Fill #5

## 2020-05-11 MED FILL — PREZISTA 800 MG TABS: 800 | 30 days supply | Qty: 30 | Fill #5

## 2020-06-19 ENCOUNTER — Other Ambulatory Visit (HOSPITAL_COMMUNITY): Payer: Self-pay

## 2020-07-09 ENCOUNTER — Other Ambulatory Visit (HOSPITAL_COMMUNITY): Payer: Self-pay

## 2020-07-27 ENCOUNTER — Other Ambulatory Visit (HOSPITAL_COMMUNITY): Payer: Self-pay

## 2020-07-27 ENCOUNTER — Other Ambulatory Visit: Payer: Self-pay | Admitting: Internal Medicine

## 2020-07-27 NOTE — Telephone Encounter (Signed)
PATIENT SCHEDULED FOR 07/28/20

## 2020-07-28 ENCOUNTER — Ambulatory Visit: Payer: BC Managed Care – PPO | Admitting: Internal Medicine

## 2020-07-30 ENCOUNTER — Other Ambulatory Visit (HOSPITAL_COMMUNITY): Payer: Self-pay

## 2020-08-01 DIAGNOSIS — J019 Acute sinusitis, unspecified: Secondary | ICD-10-CM | POA: Diagnosis not present

## 2020-08-01 DIAGNOSIS — R059 Cough, unspecified: Secondary | ICD-10-CM | POA: Diagnosis not present

## 2020-08-17 ENCOUNTER — Other Ambulatory Visit (HOSPITAL_COMMUNITY): Payer: Self-pay

## 2020-09-02 ENCOUNTER — Other Ambulatory Visit: Payer: Self-pay

## 2020-09-02 ENCOUNTER — Ambulatory Visit (INDEPENDENT_AMBULATORY_CARE_PROVIDER_SITE_OTHER): Payer: BC Managed Care – PPO | Admitting: Internal Medicine

## 2020-09-02 ENCOUNTER — Encounter (INDEPENDENT_AMBULATORY_CARE_PROVIDER_SITE_OTHER): Payer: Self-pay | Admitting: Internal Medicine

## 2020-09-02 VITALS — BP 142/86 | HR 93 | Temp 97.7°F | Ht 68.5 in | Wt 226.8 lb

## 2020-09-02 DIAGNOSIS — E291 Testicular hypofunction: Secondary | ICD-10-CM | POA: Diagnosis not present

## 2020-09-02 DIAGNOSIS — E785 Hyperlipidemia, unspecified: Secondary | ICD-10-CM | POA: Diagnosis not present

## 2020-09-02 DIAGNOSIS — E559 Vitamin D deficiency, unspecified: Secondary | ICD-10-CM | POA: Diagnosis not present

## 2020-09-02 DIAGNOSIS — Z125 Encounter for screening for malignant neoplasm of prostate: Secondary | ICD-10-CM | POA: Diagnosis not present

## 2020-09-02 DIAGNOSIS — R030 Elevated blood-pressure reading, without diagnosis of hypertension: Secondary | ICD-10-CM

## 2020-09-02 DIAGNOSIS — R5381 Other malaise: Secondary | ICD-10-CM

## 2020-09-02 DIAGNOSIS — Z131 Encounter for screening for diabetes mellitus: Secondary | ICD-10-CM | POA: Diagnosis not present

## 2020-09-02 DIAGNOSIS — R5383 Other fatigue: Secondary | ICD-10-CM

## 2020-09-02 NOTE — Progress Notes (Signed)
Metrics: Intervention Frequency ACO  Documented Smoking Status Yearly  Screened one or more times in 24 months  Cessation Counseling or  Active cessation medication Past 24 months  Past 24 months   Guideline developer: UpToDate (See UpToDate for funding source) Date Released: 2014       Wellness Office Visit  Subjective:  Patient ID: Eric Chapman., male    DOB: Feb 20, 1966  Age: 55 y.o. MRN: [id number]  CC: This 55 year old man comes to our practice as a new patient to establish care. HPI  He has HIV disease since the late 90s and sees infectious disease. He complains of feeling exhausted these days most of the time.  He moves slower. He denies any problems with focus or concentration.  He cannot be sure if his libido has decreased or not. From previous lab work that I reviewed, he does have dyslipidemia with very low HDL levels. Past Medical History:  Diagnosis Date  . HIV infection (HCC) 1998  . RLQ abdominal pain    Past Surgical History:  Procedure Laterality Date  . HERNIA REPAIR  2009   Open primary repair Dr. Lindie Spruce  . HERNIA REPAIR  02/2010   Lap VWH  repair with mesh  . TONSILLECTOMY       Family History  Problem Relation Age of Onset  . Cancer Father     Social History   Social History Narrative   Single,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   Social History   Tobacco Use  . Smoking status: Never Smoker  . Smokeless tobacco: Never Used  Substance Use Topics  . Alcohol use: Yes    Alcohol/week: 0.0 standard drinks    Comment: occ    Current Meds  Medication Sig  . darunavir (PREZISTA) 800 MG tablet TAKE 1 TABLET (800 MG TOTAL) BY MOUTH DAILY.  Marland Kitchen elvitegravir-cobicistat-emtricitabine-tenofovir (GENVOYA) 150-150-200-10 MG TABS tablet Take 1 tablet by mouth daily with breakfast.     Flowsheet Row Office Visit from 08/27/2015 in [facility] for Infectious Disease  PHQ-9 Total Score 8      Objective:    Today's Vitals: BP (!) 142/86   Pulse 93   Temp 97.7 F (36.5 C) (Temporal)   Ht 5' 8.5" (1.74 m)   Wt 226 lb 12.8 oz (102.9 kg)   SpO2 96%   BMI 33.98 kg/m  Vitals with BMI 09/02/2020 04/09/2020 07/31/2019  Height 5' 8.5" - -  Weight 226 lbs 13 oz 231 lbs -  BMI 33.98 - -  Systolic 142 155 334  Diastolic 86 88 91  Pulse 93 87 77     Physical Exam   He is obese.  Blood pressure elevated today.    Assessment   1. Dyslipidemia   2. Malaise and fatigue   3. Vitamin D deficiency disease   4. Special screening for malignant neoplasm of prostate   5. Screening for diabetes mellitus   6. Elevated blood pressure reading       Tests ordered Orders Placed This Encounter  Procedures  . CBC  . COMPLETE METABOLIC PANEL WITH GFR  . Hemoglobin A1c  . Lipid panel  . T3, free  . T4, free  . TSH  . VITAMIN D 25 Hydroxy (Vit-D Deficiency, Fractures)  . PSA, Total with Reflex to PSA, Free  . Testosterone Total,Free,Bio, Males     Plan: 1. Blood work is ordered. 2. I will see him in the next several weeks to discuss all the  results and further recommendations. 3. I spent 30 minutes with this patient reviewing all his medical records that were available and reviewing his symptoms with him.   No orders of the defined types were placed in this encounter.   Wilson Singer, MD

## 2020-09-03 LAB — T3, FREE: T3, Free: 4.1 pg/mL (ref 2.3–4.2)

## 2020-09-03 LAB — TESTOSTERONE TOTAL,FREE,BIO, MALES
Albumin: 4.6 g/dL (ref 3.6–5.1)
Sex Hormone Binding: 30 nmol/L (ref 10–50)
Testosterone, Bioavailable: 114.3 ng/dL (ref 110.0–575.0)
Testosterone, Free: 54.4 pg/mL (ref 46.0–224.0)
Testosterone: 389 ng/dL (ref 250–827)

## 2020-09-03 LAB — LIPID PANEL
Cholesterol: 158 mg/dL (ref <200)
HDL: 33 mg/dL — ABNORMAL LOW (ref >=40)
LDL Cholesterol (Calc): 106 mg/dL (calc) — ABNORMAL HIGH
Non-HDL Cholesterol (Calc): 125 mg/dL (calc) (ref <130)
Total CHOL/HDL Ratio: 4.8 (calc) (ref <5.0)
Triglycerides: 98 mg/dL (ref <150)

## 2020-09-03 LAB — CBC
HCT: 42.4 % (ref 38.5–50.0)
Hemoglobin: 14 g/dL (ref 13.2–17.1)
MCH: 29 pg (ref 27.0–33.0)
MCHC: 33 g/dL (ref 32.0–36.0)
MCV: 88 fL (ref 80.0–100.0)
MPV: 9.5 fL (ref 7.5–12.5)
Platelets: 357 10*3/uL (ref 140–400)
RBC: 4.82 10*6/uL (ref 4.20–5.80)
RDW: 11.7 % (ref 11.0–15.0)
WBC: 8.8 10*3/uL (ref 3.8–10.8)

## 2020-09-03 LAB — COMPLETE METABOLIC PANEL WITH GFR
AG Ratio: 1.8 (calc) (ref 1.0–2.5)
ALT: 22 U/L (ref 9–46)
AST: 19 U/L (ref 10–35)
Albumin: 4.6 g/dL (ref 3.6–5.1)
Alkaline phosphatase (APISO): 42 U/L (ref 35–144)
BUN: 12 mg/dL (ref 7–25)
CO2: 25 mmol/L (ref 20–32)
Calcium: 9.4 mg/dL (ref 8.6–10.3)
Chloride: 103 mmol/L (ref 98–110)
Creat: 0.87 mg/dL (ref 0.70–1.33)
GFR, Est African American: 113 mL/min/{1.73_m2} (ref >=60)
GFR, Est Non African American: 98 mL/min/{1.73_m2} (ref >=60)
Globulin: 2.5 g/dL (calc) (ref 1.9–3.7)
Glucose, Bld: 92 mg/dL (ref 65–139)
Potassium: 4.2 mmol/L (ref 3.5–5.3)
Sodium: 136 mmol/L (ref 135–146)
Total Bilirubin: 0.5 mg/dL (ref 0.2–1.2)
Total Protein: 7.1 g/dL (ref 6.1–8.1)

## 2020-09-03 LAB — HEMOGLOBIN A1C
Hgb A1c MFr Bld: 5.6 % of total Hgb (ref <5.7)
Mean Plasma Glucose: 114 mg/dL
eAG (mmol/L): 6.3 mmol/L

## 2020-09-03 LAB — T4, FREE: Free T4: 1 ng/dL (ref 0.8–1.8)

## 2020-09-03 LAB — TSH: TSH: 1.66 mIU/L (ref 0.40–4.50)

## 2020-09-03 LAB — VITAMIN D 25 HYDROXY (VIT D DEFICIENCY, FRACTURES): Vit D, 25-Hydroxy: 34 ng/mL (ref 30–100)

## 2020-09-03 LAB — PSA, TOTAL WITH REFLEX TO PSA, FREE: PSA, Total: 1.9 ng/mL (ref <=4.0)

## 2020-09-22 ENCOUNTER — Ambulatory Visit (INDEPENDENT_AMBULATORY_CARE_PROVIDER_SITE_OTHER): Payer: BC Managed Care – PPO | Admitting: Internal Medicine

## 2020-09-23 ENCOUNTER — Encounter (INDEPENDENT_AMBULATORY_CARE_PROVIDER_SITE_OTHER): Payer: Self-pay | Admitting: Internal Medicine

## 2020-09-23 ENCOUNTER — Other Ambulatory Visit: Payer: Self-pay

## 2020-09-23 ENCOUNTER — Ambulatory Visit (INDEPENDENT_AMBULATORY_CARE_PROVIDER_SITE_OTHER): Payer: BC Managed Care – PPO | Admitting: Internal Medicine

## 2020-09-23 ENCOUNTER — Ambulatory Visit (INDEPENDENT_AMBULATORY_CARE_PROVIDER_SITE_OTHER): Payer: BC Managed Care – PPO

## 2020-09-23 VITALS — BP 146/79 | HR 86 | Temp 98.9°F | Resp 18 | Ht 68.0 in | Wt 229.6 lb

## 2020-09-23 VITALS — BP 146/79 | HR 86 | Temp 98.9°F | Resp 18

## 2020-09-23 DIAGNOSIS — E785 Hyperlipidemia, unspecified: Secondary | ICD-10-CM | POA: Diagnosis not present

## 2020-09-23 DIAGNOSIS — E559 Vitamin D deficiency, unspecified: Secondary | ICD-10-CM

## 2020-09-23 DIAGNOSIS — R5383 Other fatigue: Secondary | ICD-10-CM | POA: Diagnosis not present

## 2020-09-23 DIAGNOSIS — R5381 Other malaise: Secondary | ICD-10-CM

## 2020-09-23 MED ORDER — TESTOSTERONE CYPIONATE 200 MG/ML IM SOLN
100.0000 mg | Freq: Once | INTRAMUSCULAR | Status: AC
  Administered 2020-09-23: 100 mg via INTRAMUSCULAR

## 2020-09-23 MED ORDER — TESTOSTERONE CYPIONATE 200 MG/ML IM SOLN: 100.0000 mg | INTRAMUSCULAR | 0 refills | Status: AC

## 2020-09-23 NOTE — Progress Notes (Signed)
Pt brought new bottle of Testosterone 200 mg/93ml vial in for 1st Testosterone injections training and education. Pt was given  0.4ml to the rt thigh. Pt tolerated well. No complaints. Pt is to rotate legs for every dose injection. This helps to not be sore or irritation at sites. If he notices any reaction to medication at the site of injections to please STOP & call the office to report the reaction to Dr. Karilyn Cota nursing staff. Pt & spouse will be helping to give injections today.  Explain to patient "safety"to always keep away from small kids, in dry cool area.  Dispose of needles in a resealing bottle/ jug such as a old milk or bleach mark "neeedles". When filled you can bring to office for disposal properly in bio hazard waste at [facility]. Will be back in 6-12 wk's to have lab work and schedule f/u appointment to see how patient is tolerating medication.

## 2020-09-23 NOTE — Progress Notes (Signed)
Metrics: Intervention Frequency ACO  Documented Smoking Status Yearly  Screened one or more times in 24 months  Cessation Counseling or  Active cessation medication Past 24 months  Past 24 months   Guideline developer: UpToDate (See UpToDate for funding source) Date Released: 2014       Wellness Office Visit  Subjective:  Patient ID: Eric Chapman., male    DOB: Aug 02, 1965  Age: 55 y.o. MRN: [id number]  CC: This man comes in to review his blood work and further recommendations. HPI  Repeated the fact that he feels extremely exhausted, somewhat depressed.  I reviewed all his blood work with him. His vitamin D3 levels are suboptimal. He has a degree of dyslipidemia with low HDL cholesterol levels. Testosterone levels are suboptimal. Past Medical History:  Diagnosis Date   HIV infection (HCC) 1998   RLQ abdominal pain    Past Surgical History:  Procedure Laterality Date   HERNIA REPAIR  2009   Open primary repair Dr. 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   Social History   Tobacco Use   Smoking status: Never   Smokeless tobacco: Never  Substance Use Topics   Alcohol use: Yes    Alcohol/week: 0.0 standard drinks    Comment: occ    Current Meds  Medication Sig   testosterone cypionate (DEPO-TESTOSTERONE) 200 MG/ML injection Inject 0.5 mLs (100 mg total) into the muscle every 7 (seven) days.     Flowsheet Row Office Visit from 08/27/2015 in [facility] for Infectious Disease  PHQ-9 Total Score 8       Objective:   Today's Vitals: BP (!) 146/79 (BP Location: Right Arm, Patient Position: Sitting, Cuff Size: Normal)   Pulse 86   Temp 98.9 F (37.2 C) (Temporal)   Resp 18   Ht 5\' 8"  (1.727 m)   Wt 229 lb 9.6 oz (104.1  kg)   SpO2 99%   BMI 34.91 kg/m  Vitals with BMI 09/23/2020 09/02/2020 04/09/2020  Height 5\' 8"  5' 8.5" -  Weight 229 lbs 10 oz 226 lbs 13 oz 231 lbs  BMI 34.92 33.98 -  Systolic 146 142 04/11/2020  Diastolic 79 86 88  Pulse 86 93 87     Physical Exam  He looks systemically well, at times somewhat tearful.  He is not suicidal.     Assessment   1. Malaise and fatigue   2. Dyslipidemia   3. Vitamin D deficiency disease       Tests ordered No orders of the defined types were placed in this encounter.    Plan: 1.  I discussed all his results in detail. 2.  I recommend he start taking vitamin D3 10,000 units daily. 3.  We discussed testosterone therapy.  I discussed the FDA warnings, pros and cons, benefits and side effects and mode of administration.  He would like to proceed.  We will try him on testosterone 100 mg intramuscular injections once a week.  I have shown him how to give it.  He will check back with the office regarding this also. 4.  I will see him in about 6 weeks time for follow-up    Meds ordered this encounter  Medications   testosterone cypionate (DEPO-TESTOSTERONE) 200 MG/ML injection    Sig: Inject 0.5 mLs (100 mg total) into the muscle every 7 (seven) days.    Dispense:  10 mL    Refill:  0     Azalyn Sliwa Normajean Glasgow, MD

## 2020-10-15 ENCOUNTER — Ambulatory Visit: Payer: BC Managed Care – PPO

## 2020-10-15 ENCOUNTER — Other Ambulatory Visit: Payer: Self-pay

## 2020-10-19 ENCOUNTER — Ambulatory Visit (INDEPENDENT_AMBULATORY_CARE_PROVIDER_SITE_OTHER): Payer: BC Managed Care – PPO | Admitting: Internal Medicine

## 2020-10-19 ENCOUNTER — Encounter: Payer: Self-pay | Admitting: Internal Medicine

## 2020-11-04 ENCOUNTER — Ambulatory Visit (INDEPENDENT_AMBULATORY_CARE_PROVIDER_SITE_OTHER): Payer: BC Managed Care – PPO | Admitting: Nurse Practitioner

## 2020-11-04 ENCOUNTER — Encounter (INDEPENDENT_AMBULATORY_CARE_PROVIDER_SITE_OTHER): Payer: Self-pay | Admitting: Nurse Practitioner

## 2020-11-04 ENCOUNTER — Other Ambulatory Visit: Payer: Self-pay

## 2020-11-04 VITALS — BP 108/78 | HR 84 | Temp 97.5°F | Ht 68.0 in | Wt 234.8 lb

## 2020-11-04 DIAGNOSIS — R5383 Other fatigue: Secondary | ICD-10-CM

## 2020-11-04 DIAGNOSIS — R5381 Other malaise: Secondary | ICD-10-CM | POA: Diagnosis not present

## 2020-11-04 NOTE — Patient Instructions (Signed)
[facility]: [address], [place], [place] [address]

## 2020-11-04 NOTE — Progress Notes (Signed)
Subjective:  Patient ID: Eric Serene., male    DOB: 1966/01/26  Age: 55 y.o. MRN: [id number]  CC:  Chief Complaint  Patient presents with   Follow-up    Patient states that he has had a huge improvement with the testosterone and can move much better and does not have the pain      HPI  This patient arrives today for the above.  He was started on testosterone therapy for the off label treatment of his symptoms of suboptimal testosterone by Dr. Karilyn Cota last office visit.  He tells me since starting this medication he has felt much better.  He tells me the pain in his joints has improved, he prefer to stay on the medication if possible.  He denies any difficulty breathing, chest pain, leg swelling, or leg pain.  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   Social History   Tobacco Use   Smoking status: Never   Smokeless tobacco: Never  Substance Use Topics   Alcohol use: Yes    Alcohol/week: 0.0 standard drinks    Comment: occ     Current Meds  Medication Sig   darunavir (PREZISTA) 800 MG tablet TAKE 1 TABLET (800 MG TOTAL) BY MOUTH DAILY.   elvitegravir-cobicistat-emtricitabine-tenofovir (GENVOYA) 150-150-200-10 MG TABS tablet Take 1 tablet by mouth daily with breakfast.   testosterone cypionate (DEPO-TESTOSTERONE) 200 MG/ML injection Inject 0.5 mLs (100 mg total) into the muscle every 7 (seven) days.   TUBERCULIN SYR 1CC/25GX5/8" 25G X 5/8" 1 ML MISC     ROS:  Review of Systems  Respiratory:  Negative for shortness of breath.   Cardiovascular:  Negative for chest pain, palpitations and leg swelling.    Objective:   Today's Vitals: BP 108/78   Pulse 84   Temp (!) 97.5 F (36.4 C) (Temporal)   Ht 5\' 8"  (1.727 m)   Wt 234  lb 12.8 oz (106.5 kg)   SpO2 99%   BMI 35.70 kg/m  Vitals with BMI 11/04/2020 09/23/2020 09/23/2020  Height 5\' 8"  - 5\' 8"   Weight 234 lbs 13 oz - 229 lbs 10 oz  BMI 35.71 - 34.92  Systolic 108 146 09/25/2020  Diastolic 78 79 79  Pulse 84 86 86     Physical Exam Vitals reviewed.  Constitutional:      Appearance: Normal appearance.  HENT:     Head: Normocephalic and atraumatic.  Cardiovascular:     Rate and Rhythm: Normal rate and regular rhythm.  Pulmonary:     Effort: Pulmonary effort is normal.     Breath sounds: Normal breath sounds.  Musculoskeletal:     Cervical back: Neck supple.  Skin:    General: Skin is warm and dry.  Neurological:     Mental Status: He is alert and oriented to person, place, and time.  Psychiatric:        Mood and Affect: Mood normal.        Behavior: Behavior normal.        Thought Content: Thought content normal.        Judgment: Judgment normal.         Assessment and Plan   1. Malaise and fatigue  Plan: 1.  I will check CBC as well as testosterone levels.  I have given him offices in the area that practice bioidentical hormone replacement therapy so he can try to call their and establish care with them.  He also knows needs to establish care with new primary care provider.  Further recommendations were made based upon his blood work results.   Tests ordered Orders Placed This Encounter  Procedures   CBC   Testosterone Total,Free,Bio, Males      No orders of the defined types were placed in this encounter.   Patient scheduled for follow-up as this office is closing permanently as of 11/25/20 due to the passing of Dr. Karilyn Cota.  The patient was notified of this and that they will need to find a new primary care provider.  They express understanding.   Elenore Paddy, NP

## 2020-12-03 ENCOUNTER — Other Ambulatory Visit (HOSPITAL_COMMUNITY): Payer: Self-pay

## 2020-12-03 ENCOUNTER — Other Ambulatory Visit: Payer: Self-pay

## 2020-12-03 ENCOUNTER — Ambulatory Visit: Payer: BC Managed Care – PPO | Admitting: Internal Medicine

## 2020-12-03 ENCOUNTER — Other Ambulatory Visit (HOSPITAL_COMMUNITY)
Admission: RE | Admit: 2020-12-03 | Discharge: 2020-12-03 | Disposition: A | Discharge status: Home / Self Care | Payer: BC Managed Care – PPO | Source: Ambulatory Visit | Attending: Internal Medicine | Admitting: Internal Medicine

## 2020-12-03 VITALS — BP 145/98 | HR 87 | Resp 16 | Ht 68.0 in | Wt 237.0 lb

## 2020-12-03 DIAGNOSIS — Z113 Encounter for screening for infections with a predominantly sexual mode of transmission: Secondary | ICD-10-CM | POA: Insufficient documentation

## 2020-12-03 DIAGNOSIS — B20 Human immunodeficiency virus [HIV] disease: Secondary | ICD-10-CM | POA: Diagnosis not present

## 2020-12-03 DIAGNOSIS — Z111 Encounter for screening for respiratory tuberculosis: Secondary | ICD-10-CM

## 2020-12-03 MED ORDER — GENVOYA 150-150-200-10 MG PO TABS
1.0000 | ORAL_TABLET | Freq: Every day | ORAL | 11 refills | Status: DC
  Filled 2020-12-03 – 2020-12-14 (×5): qty 30, 30d supply, fill #0
  Filled 2021-01-06: qty 30, 30d supply, fill #1
  Filled 2021-02-03: qty 30, 30d supply, fill #2
  Filled 2021-03-12 – 2021-05-04 (×3): qty 30, 30d supply, fill #3
  Filled 2021-05-26: qty 30, 30d supply, fill #4

## 2020-12-03 MED ORDER — DARUNAVIR 800 MG PO TABS
800.0000 mg | ORAL_TABLET | Freq: Every day | ORAL | 11 refills | Status: DC
  Filled 2020-12-03 – 2020-12-09 (×2): qty 60, 60d supply, fill #0
  Filled 2020-12-14: qty 30, 30d supply, fill #0
  Filled 2021-01-06: qty 30, 30d supply, fill #1
  Filled 2021-02-03: qty 30, 30d supply, fill #2
  Filled 2021-03-12 – 2021-05-04 (×3): qty 30, 30d supply, fill #3
  Filled 2021-05-26: qty 30, 30d supply, fill #4

## 2020-12-03 NOTE — Progress Notes (Signed)
[facility]  Patient Active Problem List   Diagnosis Date Noted   Current mild episode of major depressive disorder without prior episode (HCC) 04/09/2020   Sepsis (HCC) 03/15/2014   SIRS (systemic inflammatory response syndrome) (HCC) 03/15/2014   Pyrexia    HIV (human immunodeficiency virus infection) (HCC)    Abdominal pain, acute    Nausea vomiting and diarrhea 03/14/2014   History of kidney stones 08/12/2011   Abdominal pain, R sided periumbilical 05/25/2011   Human immunodeficiency virus (HIV) disease (HCC) 02/10/2006   Regional enteritis of small bowel (HCC) 02/10/2006   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   MRN: [id number]  Cc: hiv care  HPI:  Eric Chapman. is a 55 y.o. male MSM with hiv here for routine care  He normally sees Dr Drue Second who doesn't have appointment slot so sees me today  He has hx syphilis with serofast rpr titer 1:1  He takes genvoya and prezista for his hiv for at least the past year. Last seen dr Drue Second 03/2020. Previously known frequent missing of medications. 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 30 tablet 5   elvitegravir-cobicistat-emtricitabine-tenofovir (GENVOYA) 150-150-200-10 MG TABS tablet Take 1 tablet by mouth daily with breakfast. 30 tablet 5   testosterone cypionate (DEPO-TESTOSTERONE) 200 MG/ML injection Inject 0.5 mLs (100 mg total) into the muscle every 7 (seven) days. 10 mL 0   TUBERCULIN SYR 1CC/25GX5/8" 25G X 5/8" 1 ML MISC      No facility-administered medications prior to visit.     Social History   Socioeconomic History   Marital status: Single    Spouse name: Not on file   Number of children: Not on file   Years of education: Not on file   Highest education level: Not on file  Occupational History   Not on file  Tobacco Use   Smoking status: Never   Smokeless tobacco: Never  Vaping Use   Vaping Use: Never used  Substance and Sexual Activity   Alcohol use: Yes    Alcohol/week: 0.0 standard drinks    Comment: occ   Drug use: No   Sexual activity: Yes    Partners: Male    Birth control/protection: Condom    Comment: decined condoms  Other Topics Concern   Not on file  Social History Narrative   Single,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   Social  Determinants of Health   Financial Resource Strain: Not on file  Food Insecurity: Not on file  Transportation Needs: Not on file  Physical Activity: Not on file  Stress: Not on file  Social Connections: Not on file  Intimate Partner Violence: Not on file      Review of Systems    All other ros negative  Objective:    BP (!) 145/98   Pulse 87   Resp 16   Ht 5\' 8"  (1.727 m)   Wt 237 lb (107.5 kg)   SpO2 97%   BMI 36.04 kg/m  Nursing note and vital signs reviewed.  Physical Exam     General/constitutional: no distress, pleasant HEENT: Normocephalic, PER, Conj Clear, EOMI,  Oropharynx clear Neck supple CV: rrr no mrg Lungs: clear to auscultation, normal respiratory effort Abd: Soft, Nontender Ext: no edema Skin: No Rash Neuro: nonfocal   Labs: Lab Results  Component Value Date   WBC 8.8 09/02/2020   HGB 14.0 09/02/2020   HCT 42.4 09/02/2020   MCV 88.0 09/02/2020   PLT 357 09/02/2020   Last metabolic panel Lab Results  Component Value Date   GLUCOSE 92 09/02/2020   NA 136 09/02/2020   K 4.2 09/02/2020   CL 103 09/02/2020   CO2 25 09/02/2020   BUN 12 09/02/2020   CREATININE 0.87 09/02/2020   GFRNONAA 98 09/02/2020   GFRAA 113 09/02/2020   CALCIUM 9.4 09/02/2020   PROT 7.1 09/02/2020   ALBUMIN 4.2 09/16/2016   BILITOT 0.5 09/02/2020   ALKPHOS 36 (L) 09/16/2016   AST 19 09/02/2020   ALT 22 09/02/2020   ANIONGAP 9 10/15/2015   Micro:  Serology:  Imaging:  Assessment & Plan:   Problem List Items Addressed This Visit   None Visit Diagnoses     HIV disease (HCC)    -  Primary   Relevant Orders   HIV 1 RNA quant-no reflex-bld   Screening for STDs (sexually transmitted diseases)       Relevant Orders   RPR   Cytology (oral, anal, urethral) ancillary only   Hepatitis, Acute   Hepatitis B Core Antibody, total   Cytology (oral, anal, urethral) ancillary only   Cytology (oral, anal, urethral) ancillary only   Screening-pulmonary TB       Relevant Orders   QuantiFERON-TB Gold Plus      No acute issues brought up by patient Hx treated syphilis with rpr titer serofast 1:1 since 2017  -encourage more compliance with ART -std screening, hepatitis screening, tb screening and hiv labs today -f/u dr 2018 in around 2 months for ongoing routine hiv care -renew ART -patient in the process of getting a new PCP  Follow-up: Return in about 8 weeks (around 01/28/2021).  I have spent a total of 20 minutes of face-to-face and non-face-to-face time, excluding clinical staff time, preparing to see patient, ordering tests and/or  medications, and provide counseling the patient      13/05/2020, MD [facility] [phone]  pager   [phone] cell 12/03/2020, 3:02 PM

## 2020-12-03 NOTE — Addendum Note (Signed)
Addended by: Rutha Bouchard T on: 12/03/2020 03:33 PM   Modules accepted: Orders

## 2020-12-03 NOTE — Patient Instructions (Signed)
Will check all sexually transmitted disease labs today   Will also check hiv labs today   Please continue your current HIV meds (genvoya/prezista)  See dr Drue Second in 6-8 weeks for ongoing hiv care

## 2020-12-07 LAB — COMPREHENSIVE METABOLIC PANEL
AG Ratio: 1.6 (calc) (ref 1.0–2.5)
ALT: 20 U/L (ref 9–46)
AST: 17 U/L (ref 10–35)
Albumin: 4.6 g/dL (ref 3.6–5.1)
Alkaline phosphatase (APISO): 31 U/L — ABNORMAL LOW (ref 35–144)
BUN: 10 mg/dL (ref 7–25)
CO2: 28 mmol/L (ref 20–32)
Calcium: 9.5 mg/dL (ref 8.6–10.3)
Chloride: 103 mmol/L (ref 98–110)
Creat: 0.94 mg/dL (ref 0.70–1.30)
Globulin: 2.8 g/dL (calc) (ref 1.9–3.7)
Glucose, Bld: 85 mg/dL (ref 65–99)
Potassium: 4.5 mmol/L (ref 3.5–5.3)
Sodium: 138 mmol/L (ref 135–146)
Total Bilirubin: 0.4 mg/dL (ref 0.2–1.2)
Total Protein: 7.4 g/dL (ref 6.1–8.1)

## 2020-12-07 LAB — CYTOLOGY, (ORAL, ANAL, URETHRAL) ANCILLARY ONLY
Chlamydia: NEGATIVE
Chlamydia: NEGATIVE
Chlamydia: NEGATIVE
Comment: NEGATIVE
Comment: NEGATIVE
Comment: NEGATIVE
Comment: NORMAL
Comment: NORMAL
Comment: NORMAL
Neisseria Gonorrhea: NEGATIVE
Neisseria Gonorrhea: NEGATIVE
Neisseria Gonorrhea: NEGATIVE

## 2020-12-07 LAB — QUANTIFERON-TB GOLD PLUS
Mitogen-NIL: >10 IU/mL
NIL: 0.05 IU/mL
QuantiFERON-TB Gold Plus: NEGATIVE
TB1-NIL: <0 IU/mL
TB2-NIL: <0 IU/mL

## 2020-12-07 LAB — HEPATITIS PANEL, ACUTE
Hep A IgM: NONREACTIVE
Hep B C IgM: NONREACTIVE
Hepatitis B Surface Ag: NONREACTIVE
Hepatitis C Ab: NONREACTIVE
SIGNAL TO CUT-OFF: 0.05 (ref <1.00)

## 2020-12-07 LAB — CBC
HCT: 45.5 % (ref 38.5–50.0)
Hemoglobin: 15.3 g/dL (ref 13.2–17.1)
MCH: 28.9 pg (ref 27.0–33.0)
MCHC: 33.6 g/dL (ref 32.0–36.0)
MCV: 85.8 fL (ref 80.0–100.0)
MPV: 10 fL (ref 7.5–12.5)
Platelets: 311 10*3/uL (ref 140–400)
RBC: 5.3 10*6/uL (ref 4.20–5.80)
RDW: 11.7 % (ref 11.0–15.0)
WBC: 8.8 10*3/uL (ref 3.8–10.8)

## 2020-12-07 LAB — HIV-1 RNA QUANT-NO REFLEX-BLD
HIV 1 RNA Quant: 98300 Copies/mL — ABNORMAL HIGH
HIV-1 RNA Quant, Log: 4.99 Log cps/mL — ABNORMAL HIGH

## 2020-12-07 LAB — HEPATITIS B CORE ANTIBODY, TOTAL: Hep B Core Total Ab: REACTIVE — AB

## 2020-12-07 LAB — RPR: RPR Ser Ql: REACTIVE — AB

## 2020-12-07 LAB — RPR TITER: RPR Titer: 1:1 {titer} — ABNORMAL HIGH

## 2020-12-07 LAB — FLUORESCENT TREPONEMAL AB(FTA)-IGG-BLD: Fluorescent Treponemal ABS: NONREACTIVE

## 2020-12-08 ENCOUNTER — Telehealth: Payer: Self-pay

## 2020-12-08 ENCOUNTER — Other Ambulatory Visit (HOSPITAL_COMMUNITY): Payer: Self-pay

## 2020-12-08 NOTE — Telephone Encounter (Signed)
duplicate

## 2020-12-08 NOTE — Telephone Encounter (Signed)
Patient notified about elevated VL (98K). Patient reports that he had not been taking his ART's but now that his pain is better managed with testosterone injections. He inquired about HBV results; RN explained that labs show reactive likely in setting of immunization. Requesting second dose at follow up visit. Patient given number to [facility] pharmacy to set up getting medication filled.   Cora Brierley Loyola Mast, RN

## 2020-12-09 ENCOUNTER — Other Ambulatory Visit (HOSPITAL_COMMUNITY): Payer: Self-pay

## 2020-12-14 ENCOUNTER — Other Ambulatory Visit (HOSPITAL_COMMUNITY): Payer: Self-pay

## 2020-12-14 ENCOUNTER — Telehealth: Payer: Self-pay

## 2020-12-14 NOTE — Telephone Encounter (Signed)
[facility] Patient Advocate Encounter   Was successful in obtaining a [place] copay card for [employer] .  This copay card will make the patients copay 0.00.  I have spoken with the patient.    The billing information is as follows and has been shared with [facility].    Prezista Copay coupon card           Clearance Coots, CPhT Specialty Pharmacy Patient [facility] for Infectious Disease Phone: [phone] Fax:  [phone]

## 2020-12-15 ENCOUNTER — Other Ambulatory Visit (HOSPITAL_COMMUNITY): Payer: Self-pay

## 2021-01-06 ENCOUNTER — Other Ambulatory Visit (HOSPITAL_COMMUNITY): Payer: Self-pay

## 2021-01-20 ENCOUNTER — Encounter: Payer: Self-pay | Admitting: Internal Medicine

## 2021-01-20 ENCOUNTER — Ambulatory Visit (INDEPENDENT_AMBULATORY_CARE_PROVIDER_SITE_OTHER): Payer: BC Managed Care – PPO | Admitting: Internal Medicine

## 2021-01-20 ENCOUNTER — Other Ambulatory Visit: Payer: Self-pay

## 2021-01-20 ENCOUNTER — Ambulatory Visit (INDEPENDENT_AMBULATORY_CARE_PROVIDER_SITE_OTHER): Payer: BC Managed Care – PPO

## 2021-01-20 ENCOUNTER — Telehealth: Payer: Self-pay

## 2021-01-20 ENCOUNTER — Other Ambulatory Visit (HOSPITAL_COMMUNITY): Payer: Self-pay

## 2021-01-20 VITALS — BP 140/93 | HR 76 | Temp 98.1°F | Wt 238.0 lb

## 2021-01-20 DIAGNOSIS — E349 Endocrine disorder, unspecified: Secondary | ICD-10-CM

## 2021-01-20 DIAGNOSIS — B20 Human immunodeficiency virus [HIV] disease: Secondary | ICD-10-CM | POA: Diagnosis not present

## 2021-01-20 DIAGNOSIS — B1910 Unspecified viral hepatitis B without hepatic coma: Secondary | ICD-10-CM | POA: Diagnosis not present

## 2021-01-20 DIAGNOSIS — Z8619 Personal history of other infectious and parasitic diseases: Secondary | ICD-10-CM | POA: Diagnosis not present

## 2021-01-20 DIAGNOSIS — Z23 Encounter for immunization: Secondary | ICD-10-CM | POA: Diagnosis not present

## 2021-01-20 DIAGNOSIS — Z1211 Encounter for screening for malignant neoplasm of colon: Secondary | ICD-10-CM

## 2021-01-20 NOTE — Progress Notes (Signed)
[facility]  Patient Active Problem List   Diagnosis Date Noted   Current mild episode of major depressive disorder without prior episode (HCC) 04/09/2020   Sepsis (HCC) 03/15/2014   SIRS (systemic inflammatory response syndrome) (HCC) 03/15/2014   Pyrexia    HIV (human immunodeficiency virus infection) (HCC)    Abdominal pain, acute    Nausea vomiting and diarrhea 03/14/2014   History of kidney stones 08/12/2011   Abdominal pain, R sided periumbilical 05/25/2011   Human immunodeficiency virus (HIV) disease (HCC) 02/10/2006   Regional enteritis of small bowel (HCC) 02/10/2006   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   MRN: [id number]  Cc: hiv care  HPI:  Eric Chapman. is a 55 y.o. male MSM with hiv here for routine care  He normally sees Dr Drue Second who doesn't have appointment slot so sees me today  He has hx syphilis with serofast rpr titer 1:1  He takes genvoya and prezista for his hiv for at least the past year. Last seen dr Drue Second 03/2020. Previously known frequent missing of medications. 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 No smoking/drinking/drugs. Not in a current relationship. No recent sexual exposure Vaccination status -- interested in flu and bivalent booster Gotten back on genvoya and prezista; missed at least 8 doses the last 4 weeks He is interested about long acting injectable No fever, chill, cough, rash, joint pain, weight loss  Allergies  Allergen Reactions   Oxycodone Hcl Other (See Comments)   Percocet [Oxycodone-Acetaminophen] Itching and Other (See Comments)    Extreme agitation      Outpatient Medications Prior to Visit  Medication Sig Dispense Refill   darunavir (PREZISTA) 800 MG tablet Take 1 tablet (800 mg total) by mouth daily. 60 tablet 11   elvitegravir-cobicistat-emtricitabine-tenofovir (GENVOYA) 150-150-200-10 MG TABS tablet Take 1 tablet by mouth daily with breakfast. 30 tablet 11   testosterone cypionate (DEPO-TESTOSTERONE) 200 MG/ML injection Inject 0.5 mLs (100 mg total) into the muscle every 7 (seven) days. 10 mL 0   TUBERCULIN SYR 1CC/25GX5/8" 25G X 5/8" 1 ML MISC      darunavir (PREZISTA) 800 MG tablet TAKE 1 TABLET (800 MG TOTAL) BY MOUTH DAILY. (Patient not taking: Reported on 01/20/2021) 30 tablet 5   No facility-administered medications prior to visit.     Social History   Socioeconomic History   Marital status: Single    Spouse name: Not on file   Number of children: Not on file   Years of education: Not on  file   Highest education level: Not on file  Occupational History   Not on file  Tobacco Use   Smoking status: Never   Smokeless tobacco: Never  Vaping Use   Vaping Use: Never used  Substance and Sexual Activity   Alcohol use: Yes    Alcohol/week: 0.0 standard drinks    Comment: occ   Drug use: No   Sexual activity: Yes    Partners: Male    Birth control/protection: Condom    Comment: decined condoms  Other Topics Concern   Not on file  Social History Narrative    Single,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   Social Determinants of Health   Financial Resource Strain: Not on file  Food Insecurity: Not on file  Transportation Needs: Not on file  Physical Activity: Not on file  Stress: Not on file  Social Connections: Not on file  Intimate Partner Violence: Not on file      Review of Systems    All other ros negative  Objective:    Wt 238 lb (108 kg)   BMI 36.19 kg/m  Nursing note and vital signs reviewed.  Physical Exam     General/constitutional: no distress, pleasant HEENT: Normocephalic, PER, Conj Clear, EOMI, Oropharynx clear Neck supple CV: rrr no mrg Lungs: clear to auscultation, normal respiratory effort Abd: Soft, Nontender Ext: no edema Skin: No Rash Neuro: nonfocal MSK: no peripheral joint swelling/tenderness/warmth; back spines nontender     Labs: Lab Results  Component Value Date   WBC 8.8 12/03/2020   HGB 15.3 12/03/2020   HCT 45.5 12/03/2020   MCV 85.8 12/03/2020   PLT 311 12/03/2020   Last metabolic panel Lab Results  Component Value Date   GLUCOSE 85 12/03/2020   NA 138 12/03/2020   K 4.5 12/03/2020   CL 103 12/03/2020   CO2 28 12/03/2020   BUN 10 12/03/2020   CREATININE 0.94 12/03/2020   GFRNONAA 98 09/02/2020   GFRAA 113 09/02/2020   CALCIUM 9.5 12/03/2020   PROT 7.4 12/03/2020   ALBUMIN 4.2 09/16/2016   BILITOT 0.4 12/03/2020   ALKPHOS 36 (L) 09/16/2016   AST 17 12/03/2020   ALT 20 12/03/2020   ANIONGAP 9 10/15/2015    Hiv: 12/03/20        98k 04/09/20        99           /          526 (24%) 06/2019         64k         /          313 (18%) 01/2019         28           /          483 (24%) 03/2018           01/2017         UD  Micro:  Serology: 11/2020 rpr 1:1 11/2020 hep b cAb reactive, sag negative; hep a igm negative; hep c ab negative 11/2020 quant gold negative 11/2020 urine and oropharyngeal gc/chlam negative   03/2020 rpr  1:1  Imaging:  Assessment & Plan:   Problem List Items Addressed This Visit   None Visit Diagnoses     HIV disease (HCC)    -  Primary   History of syphilis           #hiv: Noncompliant history Currently since  11/2020, at least, remains on genvoya and prezista Need repeat labs today as previously was off therapy when saw me in 11/2020  -encourage more compliance with ART -discussed u=u -hiv labs today -pharmacy referal to evaluate cabanuva; genotype testing ordered   #hx syphilis Prior treated Rpr titer 1:1 since 2017 No new exposure as of 10/26 clinic visit   #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 Patient was given testosterone replacement by his pcp for fatigue/weakness which improved and he wants to continue to be on that  -IM clinic phone number given -referal to endocrinology for ongoing care   #hcm: -vaccination Prevnar 2020 Tdap 2014 Meningococcal -hepatitis Prior hep b infection -- will check viral dna and surface antibody level -std 11/2020 rpr stable titer 1:1 11/2020 oral/urine gc/chlam negative -annual tb screen 11/2020 quant gold negative -Cancer screening Will need colon cancer fit test -- ordered 10/26 Will discuss anal cancer screening    Follow-up: Return in about 4 weeks (around 02/17/2021).      Raymondo Band, MD [facility] [phone]  pager   [phone] cell 01/20/2021, 10:39 AM

## 2021-01-20 NOTE — Patient Instructions (Addendum)
Please continue your prezista and genvoya  Labs todays  Endocrinology referal placed for testosterone monitoring  Please see [place] again in around 4-6 weeks for repeat hiv testing  Thank you  Flu/covid booster today

## 2021-01-20 NOTE — Telephone Encounter (Signed)
[facility] Patient Advocate Encounter   Received notification from [facility]  that prior authorization for Eric Chapman is required.   PA submitted on 01/20/21 Key [id number] Status is pending    [facility] Clinic will continue to follow.   Clearance Coots, CPhT Specialty Pharmacy Patient [facility] for Infectious Disease Phone: [phone] Fax:  [phone]

## 2021-01-20 NOTE — Progress Notes (Signed)
   [id number]  Name:  Eric Chapman.    MRN: [id number] DOB: [birth year]  01/20/2021  Mr. [name] was observed post Covid-19 immunization for 15 minutes without incident. He was provided with Vaccine Information Sheet and instruction to access the V-Safe system.   [name] was instructed to call 911 with any severe reactions post vaccine: Difficulty breathing  Swelling of face and throat  A fast heartbeat  A bad rash all over body  Dizziness and weakness   Immunizations Administered     Name Date Dose VIS Date Route   Pfizer Covid-19 Vaccine Bivalent Booster 01/20/2021 12:17 PM 0.3 mL 11/25/2020 Intramuscular   Manufacturer: [employer], [employer]   Lot: [id number]   NDC: [phone]      Kenson Groh T Pricilla Loveless

## 2021-01-25 ENCOUNTER — Other Ambulatory Visit (HOSPITAL_COMMUNITY): Payer: Self-pay

## 2021-01-25 ENCOUNTER — Telehealth: Payer: Self-pay

## 2021-01-25 NOTE — Telephone Encounter (Signed)
[facility] Patient Advocate Encounter  Eric Chapman is covered under patient medical benefits.  Patient will have a $75.00 office copay.   Patient is enrolled in [employer] Portal Claims      Eric Chapman, CPhT Specialty Pharmacy Patient [facility] for Infectious Disease Phone: [phone] Fax:  [phone]

## 2021-01-25 NOTE — Telephone Encounter (Signed)
Thanks Lupita Leash. Waiting on his lab work to make sure he is a candidate.

## 2021-02-03 ENCOUNTER — Other Ambulatory Visit (HOSPITAL_COMMUNITY): Payer: Self-pay

## 2021-02-04 LAB — CBC
HCT: 49 % (ref 38.5–50.0)
Hemoglobin: 16.2 g/dL (ref 13.2–17.1)
MCH: 28.4 pg (ref 27.0–33.0)
MCHC: 33.1 g/dL (ref 32.0–36.0)
MCV: 86 fL (ref 80.0–100.0)
MPV: 9.9 fL (ref 7.5–12.5)
Platelets: 329 10*3/uL (ref 140–400)
RBC: 5.7 10*6/uL (ref 4.20–5.80)
RDW: 12.2 % (ref 11.0–15.0)
WBC: 6.5 10*3/uL (ref 3.8–10.8)

## 2021-02-04 LAB — COMPLETE METABOLIC PANEL WITH GFR
AG Ratio: 1.6 (calc) (ref 1.0–2.5)
ALT: 25 U/L (ref 9–46)
AST: 20 U/L (ref 10–35)
Albumin: 4.4 g/dL (ref 3.6–5.1)
Alkaline phosphatase (APISO): 32 U/L — ABNORMAL LOW (ref 35–144)
BUN: 12 mg/dL (ref 7–25)
CO2: 24 mmol/L (ref 20–32)
Calcium: 9.4 mg/dL (ref 8.6–10.3)
Chloride: 103 mmol/L (ref 98–110)
Creat: 0.87 mg/dL (ref 0.70–1.30)
Globulin: 2.8 g/dL (calc) (ref 1.9–3.7)
Glucose, Bld: 93 mg/dL (ref 65–99)
Potassium: 4.3 mmol/L (ref 3.5–5.3)
Sodium: 138 mmol/L (ref 135–146)
Total Bilirubin: 0.4 mg/dL (ref 0.2–1.2)
Total Protein: 7.2 g/dL (ref 6.1–8.1)
eGFR: 103 mL/min/{1.73_m2} (ref >=60)

## 2021-02-04 LAB — HIV-1 INTEGRASE GENOTYPE

## 2021-02-04 LAB — HEPATITIS B DNA, ULTRAQUANTITATIVE, PCR
Hepatitis B DNA (Calc): <1 Log IU/mL
Hepatitis B DNA: <10 IU/mL

## 2021-02-04 LAB — HIV-1 GENOTYPE: HIV-1 Genotype: DETECTED — AB

## 2021-02-04 LAB — HIV RNA, RTPCR W/R GT (RTI, PI,INT)
HIV 1 RNA Quant: 490 copies/mL — ABNORMAL HIGH
HIV-1 RNA Quant, Log: 2.69 Log copies/mL — ABNORMAL HIGH

## 2021-02-04 LAB — HEPATITIS B SURFACE ANTIBODY, QUANTITATIVE: Hep B S AB Quant (Post): 118 m[IU]/mL (ref >=10)

## 2021-02-04 LAB — HIV-1 RNA QUANT-NO REFLEX-BLD
HIV 1 RNA Quant: 339 Copies/mL — ABNORMAL HIGH
HIV-1 RNA Quant, Log: 2.53 Log cps/mL — ABNORMAL HIGH

## 2021-02-08 ENCOUNTER — Encounter (INDEPENDENT_AMBULATORY_CARE_PROVIDER_SITE_OTHER): Payer: BC Managed Care – PPO | Admitting: *Deleted

## 2021-02-08 ENCOUNTER — Other Ambulatory Visit: Payer: Self-pay

## 2021-02-08 DIAGNOSIS — Z006 Encounter for examination for normal comparison and control in clinical research program: Secondary | ICD-10-CM

## 2021-02-09 ENCOUNTER — Other Ambulatory Visit (HOSPITAL_COMMUNITY): Payer: Self-pay

## 2021-02-09 ENCOUNTER — Encounter: Payer: Self-pay | Admitting: Physician Assistant

## 2021-02-09 DIAGNOSIS — L249 Irritant contact dermatitis, unspecified cause: Secondary | ICD-10-CM | POA: Insufficient documentation

## 2021-02-09 NOTE — Research (Signed)
Eric Chapman was here to screen for the [id number] study, [employer]. He had reviewed the consent at home prior to coming in for the visit. We reviewed the consent together and answered all his questions about the study. He was diagnosed with HIV back in 1998 and has been on several different arvs. He is currently on Prezcobix and Genvoya. He says his adherence issues had been more related to depression and lack of stability over the past 7 years and that covid just intensified it. He says he can commit to the visits and taking his ARVs on study, since he feels that the injectables are a good option for him. He says his depression, fatigue and pain issues are much improved since he started testosterone supplementation in July and are no longer a problem. He also takes vitamin D daily. PE was completed by Tresa Endo and EKg done as well. We have tentatively planned for entry in 3 weeks.

## 2021-02-10 LAB — COMPREHENSIVE METABOLIC PANEL
AG Ratio: 1.6 (calc) (ref 1.0–2.5)
ALT: 45 U/L (ref 9–46)
AST: 44 U/L — ABNORMAL HIGH (ref 10–35)
Albumin: 4.4 g/dL (ref 3.6–5.1)
Alkaline phosphatase (APISO): 32 U/L — ABNORMAL LOW (ref 35–144)
BUN: 17 mg/dL (ref 7–25)
CO2: 25 mmol/L (ref 20–32)
Calcium: 9.6 mg/dL (ref 8.6–10.3)
Chloride: 104 mmol/L (ref 98–110)
Creat: 1 mg/dL (ref 0.70–1.30)
Globulin: 2.7 g/dL (calc) (ref 1.9–3.7)
Glucose, Bld: 90 mg/dL (ref 65–99)
Potassium: 4.1 mmol/L (ref 3.5–5.3)
Sodium: 138 mmol/L (ref 135–146)
Total Bilirubin: 0.5 mg/dL (ref 0.2–1.2)
Total Protein: 7.1 g/dL (ref 6.1–8.1)

## 2021-02-10 LAB — URINALYSIS, ROUTINE W REFLEX MICROSCOPIC
Bilirubin Urine: NEGATIVE
Glucose, UA: NEGATIVE
Ketones, ur: NEGATIVE
Leukocytes,Ua: NEGATIVE
Nitrite: NEGATIVE
Protein, ur: NEGATIVE
Specific Gravity, Urine: 1.03 (ref 1.001–1.035)
pH: 5.5 (ref 5.0–8.0)

## 2021-02-10 LAB — HEPATITIS C ANTIBODY
Hepatitis C Ab: NONREACTIVE
SIGNAL TO CUT-OFF: 0.12 (ref <1.00)

## 2021-02-10 LAB — BILIRUBIN, DIRECT: Bilirubin, Direct: 0.1 mg/dL (ref 0.0–0.2)

## 2021-02-10 LAB — HIV-1/2 AB - DIFFERENTIATION
HIV-1 antibody: POSITIVE — AB
HIV-2 Ab: NEGATIVE

## 2021-02-10 LAB — MICROSCOPIC MESSAGE

## 2021-02-10 LAB — HIV ANTIBODY (ROUTINE TESTING W REFLEX): HIV 1&2 Ab, 4th Generation: REACTIVE — AB

## 2021-02-15 ENCOUNTER — Other Ambulatory Visit: Payer: Self-pay

## 2021-02-15 ENCOUNTER — Ambulatory Visit: Payer: BC Managed Care – PPO | Admitting: Internal Medicine

## 2021-02-15 ENCOUNTER — Encounter: Payer: Self-pay | Admitting: Internal Medicine

## 2021-02-15 ENCOUNTER — Other Ambulatory Visit (HOSPITAL_COMMUNITY): Payer: Self-pay

## 2021-02-15 VITALS — BP 134/85 | HR 72 | Temp 97.5°F | Wt 232.0 lb

## 2021-02-15 DIAGNOSIS — B20 Human immunodeficiency virus [HIV] disease: Secondary | ICD-10-CM | POA: Diagnosis not present

## 2021-02-15 DIAGNOSIS — E291 Testicular hypofunction: Secondary | ICD-10-CM | POA: Diagnosis not present

## 2021-02-15 DIAGNOSIS — Z23 Encounter for immunization: Secondary | ICD-10-CM | POA: Diagnosis not present

## 2021-02-15 MED ORDER — TESTOSTERONE CYPIONATE 200 MG/ML IM SOLN
INTRAMUSCULAR | 0 refills | Status: AC
  Filled 2021-02-15: qty 4, 28d supply, fill #0

## 2021-02-15 NOTE — Addendum Note (Signed)
Addended by: Juanita Laster on: 02/15/2021 09:32 AM   Modules accepted: Orders

## 2021-02-15 NOTE — Patient Instructions (Addendum)
I am glad you are enrolled in the Lat[employer] study   I will coordinate care with them. I believe we can just check in every 6 months because they will be monitoring hiv load every month for you   For our part (aside from research), will focus on preventative care (cancer screening/immunizations)   Today: Pneumovax Hiv lab

## 2021-02-15 NOTE — Progress Notes (Signed)
[facility]  Patient Active Problem List   Diagnosis Date Noted   Irritant contact dermatitis 02/09/2021   Current mild episode of major depressive disorder without prior episode (HCC) 04/09/2020   Sepsis (HCC) 03/15/2014   SIRS (systemic inflammatory response syndrome) (HCC) 03/15/2014   Pyrexia    HIV (human immunodeficiency virus infection) (HCC)    Abdominal pain, acute    Nausea vomiting and diarrhea 03/14/2014   History of kidney stones 08/12/2011   Abdominal pain, R sided periumbilical 05/25/2011   Human immunodeficiency virus (HIV) disease (HCC) 02/10/2006   Regional enteritis of small bowel (HCC) 02/10/2006   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   MRN: [id number]  Cc: hiv care  HPI:  Eric Chapman. is a 55 y.o. male MSM with hiv here for routine care  He normally sees Dr Drue Second who doesn't have appointment slot so sees me today  He has hx syphilis with serofast rpr titer 1:1  He takes genvoya and prezista for his hiv for at least the past year. Last seen dr Drue Second 03/2020. Previously known frequent missing of medications. He attributed this noncompliance to his severe backpain as he "could care less for taking the medications."  He has periods of well controlled hiv interspersed with high viral load count   Lab Results  Component Value Date   HIV1RNAQUANT 339 (H) 01/20/2021   HIV1RNAQUANT 490 (H) 01/20/2021   Lab Results  Component Value Date   CD4TCELL 24 (L) 04/09/2020   CD4TABS 526 04/09/2020   Denies headache, weight loss, f/c/nightsweat, cough, skin rash, penile discharge, n/v/diarrhea  His pain is well controlled since he has been on testosterone replacement  Social: Working full time self employed sheet rock No smoking/drinking/drugs Not in a sexual relationship at this time   -------------- 10/26 id clinic f/u Social - "working all the  time." Still doing own business in [employer] libr[employer], and 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 No smoking/drinking/drugs. Not in a current relationship. 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 He is still looking for a PCP.  He just enrolled in [employer] (consent signed 01/2021). He remains on prezista and genvoya. Missing doses still. He doesn't really bring up any reason for missing dose except "having a routine."  He owns his own house No f c n v diarrhea rash   Allergies  Allergen Reactions   Oxycodone Hcl Other (See Comments)   Percocet [Oxycodone-Acetaminophen] Itching and Other (See Comments)    Extreme agitation      Outpatient Medications Prior to Visit  Medication Sig Dispense Refill   darunavir (PREZISTA) 800 MG tablet Take 1 tablet (800 mg total) by mouth daily. 60 tablet 11   elvitegravir-cobicistat-emtricitabine-tenofovir (GENVOYA) 150-150-200-10 MG TABS tablet Take 1 tablet by mouth daily with breakfast. 30 tablet 11   testosterone cypionate (DEPO-TESTOSTERONE) 200 MG/ML injection Inject 0.5 mLs (100 mg total) into the muscle every 7 (seven) days. 10 mL 0   TUBERCULIN SYR 1CC/25GX5/8"  25G X 5/8" 1 ML MISC      darunavir (PREZISTA) 800 MG tablet TAKE 1 TABLET (800 MG TOTAL) BY MOUTH DAILY. (Patient not taking: Reported on 01/20/2021) 30 tablet 5   No facility-administered medications prior to visit.     Social History   Socioeconomic History   Marital status: Single    Spouse name: Not on file   Number of children: Not on file   Years of education: Not on file   Highest education level: Not on file   Occupational History   Not on file  Tobacco Use   Smoking status: Never   Smokeless tobacco: Never  Vaping Use   Vaping Use: Never used  Substance and Sexual Activity   Alcohol use: Yes    Alcohol/week: 0.0 standard drinks    Comment: occ   Drug use: No   Sexual activity: Yes    Partners: Male    Birth control/protection: Condom    Comment: decined condoms  Other Topics Concern   Not on file  Social History Narrative   Single,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   Social Determinants of Health   Financial Resource Strain: Not on file  Food Insecurity: Not on file  Transportation Needs: Not on file  Physical Activity: Not on file  Stress: Not on file  Social Connections: Not on file  Intimate Partner Violence: Not on file      Review of Systems    All other ros negative  Objective:    BP 134/85   Pulse 72   Temp (!) 97.5 F (36.4 C) (Temporal)   Wt 232 lb (105.2 kg)   SpO2 97%   BMI 35.28 kg/m  Nursing note and vital signs reviewed.  Physical Exam     General/constitutional: no distress, pleasant HEENT: Normocephalic, PER, Conj Clear, EOMI, Oropharynx clear Neck supple CV: rrr no mrg Lungs: clear to auscultation, normal respiratory effort Abd: Soft, Nontender Ext: no edema Skin: No Rash Neuro: nonfocal MSK: no peripheral joint swelling/tenderness/warmth; back spines nontender       Labs: Lab Results  Component Value Date   WBC 6.5 01/20/2021   HGB 16.2 01/20/2021   HCT 49.0 01/20/2021   MCV 86.0 01/20/2021   PLT 329 01/20/2021   Last metabolic panel Lab Results  Component Value Date   GLUCOSE 90 02/08/2021   NA 138 02/08/2021   K 4.1 02/08/2021   CL 104 02/08/2021   CO2 25 02/08/2021   BUN 17 02/08/2021   CREATININE 1.00 02/08/2021   GFRNONAA 98 09/02/2020   GFRAA 113 09/02/2020   CALCIUM 9.6 02/08/2021   PROT 7.1 02/08/2021   ALBUMIN 4.2 09/16/2016   BILITOT 0.5 02/08/2021   ALKPHOS 36 (L) 09/16/2016    AST 44 (H) 02/08/2021   ALT 45 02/08/2021   ANIONGAP 9 10/15/2015    Hiv: 10/24             399        / 12/03/20        98k 04/09/20        99           /          526 (24%) 06/2019         64k         /          313 (18%) 01/2019         28           /  483 (24%) 03/2018           01/2017         UD  Micro:  Serology: 11/2020 rpr 1:1 11/2020 hep b cAb reactive, sag negative; hep a igm negative; hep c ab negative 11/2020 quant gold negative 11/2020 urine and oropharyngeal gc/chlam negative   03/2020 rpr 1:1  Imaging:  Assessment & Plan:   Problem List Items Addressed This Visit   None Visit Diagnoses     HIV disease (HCC)    -  Primary       #hiv: Noncompliant history Currently since 11/2020, at least, remains on genvoya and prezista Need repeat labs today as previously was off therapy when saw me in 11/2020  Patient enrolled in [facility] as of 01/2021 Still missing doses Talked about getting in a "routine"  -encourage more compliance with ART -discussed u=u -hiv rna today -will follow up in 6 months; he is to be followed monthly by the study group   #hx syphilis Prior treated Rpr titer 1:1 since 2017 No new exposure as of 10/26 clinic visit   #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 Patient was given testosterone replacement by his pcp for fatigue/weakness which improved and he wants to continue to be on that  Patient not seen by pcp or endocrinology "because he started a new job."  -will renew once again his androgen replacement therapy but advise he needs to get endocrinology -rx in media tab    #hcm: -vaccination Prevnar 2020; will give pneumovax today Tdap 2014 Meningococcal -hepatitis Prior hep b infection -- will check viral dna and surface antibody level -std 11/2020 rpr stable titer 1:1 11/2020 oral/urine gc/chlam negative -annual tb screen 11/2020 quant gold negative -Cancer screening Will need colon cancer fit  test -- ordered 10/26 Will discuss anal cancer screening    Follow-up: Return in about 6 months (around 08/15/2021).      Raymondo Band, MD [facility] [phone]  pager   [phone] cell 02/15/2021, 9:04 AM

## 2021-02-17 LAB — HIV-1 RNA QUANT-NO REFLEX-BLD
HIV 1 RNA Quant: 35 Copies/mL — ABNORMAL HIGH
HIV-1 RNA Quant, Log: 1.54 Log cps/mL — ABNORMAL HIGH

## 2021-03-03 ENCOUNTER — Other Ambulatory Visit: Payer: Self-pay

## 2021-03-03 ENCOUNTER — Encounter (INDEPENDENT_AMBULATORY_CARE_PROVIDER_SITE_OTHER): Payer: BC Managed Care – PPO | Admitting: *Deleted

## 2021-03-03 ENCOUNTER — Other Ambulatory Visit (HOSPITAL_COMMUNITY): Payer: Self-pay

## 2021-03-03 VITALS — BP 141/86 | HR 69 | Temp 98.1°F | Wt 236.4 lb

## 2021-03-03 DIAGNOSIS — Z006 Encounter for examination for normal comparison and control in clinical research program: Secondary | ICD-10-CM

## 2021-03-03 NOTE — Research (Signed)
Eric Chapman was here for entry for A5359, [facility]. After eligibility was confirmed, he was enrolled on study. He will take his own medications of Genvoya and Prezista since they are not provided by the study and he is used to them. We discussed ways to improve adherence by getting on a regular time schedule, keeping a calendar to check off when he does take them and using a phone alarm to remind him to take them every day. He says he thinks he is doing better since he now is working a regular job and can be more consistent with timing. He has been taking them in the afternoon with a snack at work but has forgot a few times the past month. He denies any new problems and did get his pneumonvax a few weeks ago with Dr. Orlando Penner appointment.

## 2021-03-05 LAB — HEPATIC FUNCTION PANEL
AG Ratio: 1.4 (calc) (ref 1.0–2.5)
ALT: 24 U/L (ref 9–46)
AST: 17 U/L (ref 10–35)
Albumin: 4 g/dL (ref 3.6–5.1)
Alkaline phosphatase (APISO): 31 U/L — ABNORMAL LOW (ref 35–144)
Bilirubin, Direct: 0.1 mg/dL (ref 0.0–0.2)
Globulin: 2.8 g/dL (calc) (ref 1.9–3.7)
Indirect Bilirubin: 0.2 mg/dL (calc) (ref 0.2–1.2)
Total Bilirubin: 0.3 mg/dL (ref 0.2–1.2)
Total Protein: 6.8 g/dL (ref 6.1–8.1)

## 2021-03-05 LAB — HIV-1 RNA QUANT-NO REFLEX-BLD
HIV 1 RNA Quant: 64 Copies/mL — ABNORMAL HIGH
HIV-1 RNA Quant, Log: 1.8 Log cps/mL — ABNORMAL HIGH

## 2021-03-05 LAB — LIPID PANEL
Cholesterol: 147 mg/dL (ref <200)
HDL: 34 mg/dL — ABNORMAL LOW (ref >=40)
LDL Cholesterol (Calc): 93 mg/dL (calc)
Non-HDL Cholesterol (Calc): 113 mg/dL (calc) (ref <130)
Total CHOL/HDL Ratio: 4.3 (calc) (ref <5.0)
Triglycerides: 102 mg/dL (ref <150)

## 2021-03-10 ENCOUNTER — Other Ambulatory Visit (HOSPITAL_COMMUNITY): Payer: Self-pay

## 2021-03-12 ENCOUNTER — Other Ambulatory Visit (HOSPITAL_COMMUNITY): Payer: Self-pay

## 2021-03-17 ENCOUNTER — Other Ambulatory Visit: Payer: Self-pay

## 2021-03-17 ENCOUNTER — Encounter (INDEPENDENT_AMBULATORY_CARE_PROVIDER_SITE_OTHER): Payer: BC Managed Care – PPO | Admitting: *Deleted

## 2021-03-17 VITALS — BP 147/93 | HR 91 | Temp 98.2°F | Wt 230.1 lb

## 2021-03-17 DIAGNOSIS — Z006 Encounter for examination for normal comparison and control in clinical research program: Secondary | ICD-10-CM

## 2021-03-17 NOTE — Research (Signed)
Eric Chapman was here for his week 2 visit for A5359. He said he had missed a couple doses of his ARVs since entry mainly due to his hectic work schedule. 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  He will be coming back in 2 weeks.he was given the clincard today which was loaded with $ 75 for keeping the week 2 visit.

## 2021-03-24 ENCOUNTER — Other Ambulatory Visit (HOSPITAL_COMMUNITY): Payer: Self-pay

## 2021-03-31 ENCOUNTER — Other Ambulatory Visit: Payer: Self-pay

## 2021-03-31 ENCOUNTER — Encounter (INDEPENDENT_AMBULATORY_CARE_PROVIDER_SITE_OTHER): Payer: BC Managed Care – PPO | Admitting: *Deleted

## 2021-03-31 VITALS — BP 143/97 | HR 76 | Temp 98.5°F | Wt 227.3 lb

## 2021-03-31 DIAGNOSIS — Z006 Encounter for examination for normal comparison and control in clinical research program: Secondary | ICD-10-CM

## 2021-03-31 NOTE — Research (Signed)
Eric Chapman was here for his week 4 visit for A5359. He says his work schedule has been very busy last few weeks due to weather related issues. 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 He does remember missing 3 pills since last visit. Denies any health issues. BP up a little which he attributes to work, as he just left from there. He did forget to bring his meds again. Next visit will be in 4 weeks in the am.

## 2021-04-02 LAB — COMPREHENSIVE METABOLIC PANEL
AG Ratio: 1.5 (calc) (ref 1.0–2.5)
ALT: 23 U/L (ref 9–46)
AST: 19 U/L (ref 10–35)
Albumin: 4.3 g/dL (ref 3.6–5.1)
Alkaline phosphatase (APISO): 32 U/L — ABNORMAL LOW (ref 35–144)
BUN: 11 mg/dL (ref 7–25)
CO2: 26 mmol/L (ref 20–32)
Calcium: 9.7 mg/dL (ref 8.6–10.3)
Chloride: 105 mmol/L (ref 98–110)
Creat: 0.95 mg/dL (ref 0.70–1.30)
Globulin: 2.9 g/dL (calc) (ref 1.9–3.7)
Glucose, Bld: 88 mg/dL (ref 65–99)
Potassium: 4.2 mmol/L (ref 3.5–5.3)
Sodium: 139 mmol/L (ref 135–146)
Total Bilirubin: 0.4 mg/dL (ref 0.2–1.2)
Total Protein: 7.2 g/dL (ref 6.1–8.1)

## 2021-04-02 LAB — CBC WITH DIFFERENTIAL/PLATELET
Absolute Monocytes: 679 cells/uL (ref 200–950)
Basophils Absolute: 39 cells/uL (ref 0–200)
Basophils Relative: 0.5 %
Eosinophils Absolute: 257 cells/uL (ref 15–500)
Eosinophils Relative: 3.3 %
HCT: 45.5 % (ref 38.5–50.0)
Hemoglobin: 15.3 g/dL (ref 13.2–17.1)
Lymphs Abs: 2387 cells/uL (ref 850–3900)
MCH: 29.7 pg (ref 27.0–33.0)
MCHC: 33.6 g/dL (ref 32.0–36.0)
MCV: 88.3 fL (ref 80.0–100.0)
MPV: 10.3 fL (ref 7.5–12.5)
Monocytes Relative: 8.7 %
Neutro Abs: 4438 cells/uL (ref 1500–7800)
Neutrophils Relative %: 56.9 %
Platelets: 366 10*3/uL (ref 140–400)
RBC: 5.15 10*6/uL (ref 4.20–5.80)
RDW: 12.8 % (ref 11.0–15.0)
Total Lymphocyte: 30.6 %
WBC: 7.8 10*3/uL (ref 3.8–10.8)

## 2021-04-02 LAB — BILIRUBIN, DIRECT: Bilirubin, Direct: 0.1 mg/dL (ref 0.0–0.2)

## 2021-04-02 LAB — HIV-1 RNA QUANT-NO REFLEX-BLD
HIV 1 RNA Quant: <20 Copies/mL — ABNORMAL HIGH
HIV-1 RNA Quant, Log: <1.3 Log cps/mL — ABNORMAL HIGH

## 2021-04-07 ENCOUNTER — Other Ambulatory Visit (HOSPITAL_COMMUNITY): Payer: Self-pay

## 2021-04-16 ENCOUNTER — Other Ambulatory Visit (HOSPITAL_COMMUNITY): Payer: Self-pay

## 2021-04-26 ENCOUNTER — Other Ambulatory Visit (HOSPITAL_COMMUNITY): Payer: Self-pay

## 2021-04-27 ENCOUNTER — Encounter (INDEPENDENT_AMBULATORY_CARE_PROVIDER_SITE_OTHER): Payer: BC Managed Care – PPO | Admitting: *Deleted

## 2021-04-27 ENCOUNTER — Other Ambulatory Visit: Payer: Self-pay

## 2021-04-27 VITALS — BP 135/91 | HR 79 | Temp 98.1°F | Wt 228.0 lb

## 2021-04-27 DIAGNOSIS — Z006 Encounter for examination for normal comparison and control in clinical research program: Secondary | ICD-10-CM

## 2021-04-27 NOTE — Research (Signed)
Eric Chapman was here for his week 8 visit for A5359. He said he has not done a good job this past month with taking meds and probably missed about 10 doses. He denies any new problems or medications. He will be returning in 4 weeks.

## 2021-04-29 LAB — HIV-1 RNA QUANT-NO REFLEX-BLD
HIV 1 RNA Quant: 53 Copies/mL — ABNORMAL HIGH
HIV-1 RNA Quant, Log: 1.72 Log cps/mL — ABNORMAL HIGH

## 2021-05-03 ENCOUNTER — Other Ambulatory Visit (HOSPITAL_COMMUNITY): Payer: Self-pay

## 2021-05-04 ENCOUNTER — Other Ambulatory Visit (HOSPITAL_COMMUNITY): Payer: Self-pay

## 2021-05-24 ENCOUNTER — Other Ambulatory Visit (HOSPITAL_COMMUNITY): Payer: Self-pay

## 2021-05-25 ENCOUNTER — Emergency Department (HOSPITAL_BASED_OUTPATIENT_CLINIC_OR_DEPARTMENT_OTHER)
Admission: EM | Admit: 2021-05-25 | Discharge: 2021-05-25 | Disposition: A | Discharge status: Home / Self Care | Payer: 59 | Attending: Emergency Medicine | Admitting: Emergency Medicine

## 2021-05-25 ENCOUNTER — Emergency Department (HOSPITAL_BASED_OUTPATIENT_CLINIC_OR_DEPARTMENT_OTHER): Payer: 59

## 2021-05-25 ENCOUNTER — Encounter (HOSPITAL_BASED_OUTPATIENT_CLINIC_OR_DEPARTMENT_OTHER): Payer: Self-pay

## 2021-05-25 ENCOUNTER — Other Ambulatory Visit (HOSPITAL_BASED_OUTPATIENT_CLINIC_OR_DEPARTMENT_OTHER): Payer: Self-pay

## 2021-05-25 ENCOUNTER — Other Ambulatory Visit: Payer: Self-pay

## 2021-05-25 ENCOUNTER — Encounter (INDEPENDENT_AMBULATORY_CARE_PROVIDER_SITE_OTHER): Payer: BC Managed Care – PPO | Admitting: *Deleted

## 2021-05-25 VITALS — BP 126/83 | HR 73 | Temp 97.5°F | Wt 221.6 lb

## 2021-05-25 DIAGNOSIS — R739 Hyperglycemia, unspecified: Secondary | ICD-10-CM | POA: Diagnosis not present

## 2021-05-25 DIAGNOSIS — L03314 Cellulitis of groin: Secondary | ICD-10-CM | POA: Insufficient documentation

## 2021-05-25 DIAGNOSIS — Z21 Asymptomatic human immunodeficiency virus [HIV] infection status: Secondary | ICD-10-CM | POA: Diagnosis not present

## 2021-05-25 DIAGNOSIS — R109 Unspecified abdominal pain: Secondary | ICD-10-CM | POA: Diagnosis not present

## 2021-05-25 DIAGNOSIS — E876 Hypokalemia: Secondary | ICD-10-CM | POA: Diagnosis not present

## 2021-05-25 DIAGNOSIS — R2240 Localized swelling, mass and lump, unspecified lower limb: Secondary | ICD-10-CM | POA: Diagnosis present

## 2021-05-25 DIAGNOSIS — L03818 Cellulitis of other sites: Secondary | ICD-10-CM

## 2021-05-25 DIAGNOSIS — Z006 Encounter for examination for normal comparison and control in clinical research program: Secondary | ICD-10-CM

## 2021-05-25 LAB — CBC WITH DIFFERENTIAL/PLATELET
Abs Immature Granulocytes: 0.04 10*3/uL (ref 0.00–0.07)
Basophils Absolute: 0 10*3/uL (ref 0.0–0.1)
Basophils Relative: 0 %
Eosinophils Absolute: 0.3 10*3/uL (ref 0.0–0.5)
Eosinophils Relative: 4 %
HCT: 41.6 % (ref 39.0–52.0)
Hemoglobin: 13.8 g/dL (ref 13.0–17.0)
Immature Granulocytes: 0 %
Lymphocytes Relative: 16 %
Lymphs Abs: 1.6 10*3/uL (ref 0.7–4.0)
MCH: 29.2 pg (ref 26.0–34.0)
MCHC: 33.2 g/dL (ref 30.0–36.0)
MCV: 88.1 fL (ref 80.0–100.0)
Monocytes Absolute: 1.3 10*3/uL — ABNORMAL HIGH (ref 0.1–1.0)
Monocytes Relative: 13 %
Neutro Abs: 6.5 10*3/uL (ref 1.7–7.7)
Neutrophils Relative %: 67 %
Platelets: 338 10*3/uL (ref 150–400)
RBC: 4.72 MIL/uL (ref 4.22–5.81)
RDW: 12.2 % (ref 11.5–15.5)
WBC: 9.8 10*3/uL (ref 4.0–10.5)
nRBC: 0 % (ref 0.0–0.2)

## 2021-05-25 LAB — URINALYSIS, ROUTINE W REFLEX MICROSCOPIC
Bilirubin Urine: NEGATIVE
Glucose, UA: NEGATIVE mg/dL
Ketones, ur: NEGATIVE mg/dL
Leukocytes,Ua: NEGATIVE
Nitrite: NEGATIVE
Protein, ur: 30 mg/dL — AB
Specific Gravity, Urine: 1.025 (ref 1.005–1.030)
pH: 5.5 (ref 5.0–8.0)

## 2021-05-25 LAB — COMPREHENSIVE METABOLIC PANEL
ALT: 31 U/L (ref 0–44)
AST: 18 U/L (ref 15–41)
Albumin: 3.2 g/dL — ABNORMAL LOW (ref 3.5–5.0)
Alkaline Phosphatase: 32 U/L — ABNORMAL LOW (ref 38–126)
Anion gap: 6 (ref 5–15)
BUN: 12 mg/dL (ref 6–20)
CO2: 30 mmol/L (ref 22–32)
Calcium: 9 mg/dL (ref 8.9–10.3)
Chloride: 100 mmol/L (ref 98–111)
Creatinine, Ser: 0.84 mg/dL (ref 0.61–1.24)
GFR, Estimated: >60 mL/min (ref >60)
Glucose, Bld: 104 mg/dL — ABNORMAL HIGH (ref 70–99)
Potassium: 3.4 mmol/L — ABNORMAL LOW (ref 3.5–5.1)
Sodium: 136 mmol/L (ref 135–145)
Total Bilirubin: 0.4 mg/dL (ref 0.3–1.2)
Total Protein: 7.2 g/dL (ref 6.5–8.1)

## 2021-05-25 LAB — URINALYSIS, MICROSCOPIC (REFLEX)

## 2021-05-25 MED ORDER — SULFAMETHOXAZOLE-TRIMETHOPRIM 800-160 MG PO TABS
1.0000 | ORAL_TABLET | Freq: Two times a day (BID) | ORAL | 0 refills | Status: DC
  Filled 2021-05-25: qty 14, 7d supply, fill #0

## 2021-05-25 MED ORDER — IOHEXOL 300 MG/ML  SOLN
100.0000 mL | Freq: Once | INTRAMUSCULAR | Status: AC | PRN
  Administered 2021-05-25: 100 mL via INTRAVENOUS

## 2021-05-25 MED ORDER — CEFDINIR 300 MG PO CAPS
300.0000 mg | ORAL_CAPSULE | Freq: Two times a day (BID) | ORAL | 0 refills | Status: AC
  Filled 2021-05-25: qty 20, 10d supply, fill #0

## 2021-05-25 MED ORDER — SODIUM CHLORIDE 0.9 % IV SOLN
1.0000 g | Freq: Once | INTRAVENOUS | Status: AC
  Administered 2021-05-25: 1 g via INTRAVENOUS
  Filled 2021-05-25: qty 10

## 2021-05-25 MED ORDER — SODIUM CHLORIDE 0.9 % IV SOLN: INTRAVENOUS | Status: DC | PRN

## 2021-05-25 NOTE — ED Notes (Signed)
ED Provider at bedside. 

## 2021-05-25 NOTE — Discharge Instructions (Addendum)
You were seen here today for evaluation of your testicular, scrotal, and penile swelling. Your labs and imaging showed an infection of the skin, but not a deep space infection. For this, we have placed you on two antibiotics, Omnicef and Bactrim. The Omnicef you will take twice a day for 10 days. Bactrim you will take twice a day for a week. I have included the information for a urologist in the discharge paperwork, please call to schedule and appointment. If you have any trouble urinating, blood in urine, pain with urination, worsening swelling, pain, fevers, please return to the nearest ER for re-evaluation.

## 2021-05-25 NOTE — Research (Signed)
Eric Chapman was here today for his week 12 visit for A5359. He was injured at work on Friday when his rt testicle was crushed. He says he has been taking tylenol and ibuprofen for pain. He did use ice initially. He says it is still swollen and tender. He was seen by Claiborne Billings here in the research clinic for evaluation and she directed him to go to the Ed for further evaluation for questionable hydrocele and r/o torsion.   He says his adherence has been bad for the past few weeks due to being busy and just not making it a priority. Adherence counseling was done and knows he has 2 more visits to get his VL under 200 to make it in to the next step of the study. He will be returning in 4 weeks.

## 2021-05-25 NOTE — ED Notes (Signed)
Patient transported to CT 

## 2021-05-25 NOTE — ED Triage Notes (Signed)
Pt reports groin injury 2/24-c/o pain/swelling to scrotum/penis-NAD-slow gait

## 2021-05-25 NOTE — ED Provider Notes (Signed)
Shared visit.  Here with scrotal swelling.  Unremarkable vitals.  History of HIV.  He has had some scrotal swelling for the last 4 days but overall improving.  He is able to urinate without any issues.  He has diffuse swelling to his scrotum and into the shaft of his penis.  There is no bruising.  Denies any specific trauma but does work with tools and wears a tool belt.  He has a history of hernia repair.  He is having normal bowel movements.  Overall differential includes epididymitis versus orchitis versus torsion versus scrotal cellulitis.  I do not have any clinical suspicion for penile fracture.  There is no crepitus and no swelling to the perineum.  I have a lower suspicion for Fournier's.  Ultrasound has already been completed prior to my evaluation that shows diffuse scrotal swelling.  No torsion.  No orchitis or epididymitis.  Urinalysis negative for infection.  We will get basic labs and get a CT scan of the abdomen and pelvis to further evaluate for infectious process.  We will give him a dose of IV Rocephin.  Assuming lab work and imaging are unremarkable we will start him on oral antibiotics and have him follow-up with urology.  Please see PA note for further results, evaluation, disposition of the patient.  This chart was dictated using voice recognition software.  Despite best efforts to proofread,  errors can occur which can change the documentation meaning.    Lennice Sites, DO 05/25/21 1500

## 2021-05-25 NOTE — ED Notes (Signed)
Pt A&Ox4 ambulatory at d/c with independent steady gait, NAD. Pt verbalized understanding of d/c instructions, prescriptions and follow up care. ?

## 2021-05-26 ENCOUNTER — Other Ambulatory Visit (HOSPITAL_COMMUNITY): Payer: Self-pay

## 2021-05-26 LAB — URINE CULTURE: Culture: NO GROWTH

## 2021-05-26 LAB — GC/CHLAMYDIA PROBE AMP (~~LOC~~) NOT AT ARMC
Chlamydia: NEGATIVE
Comment: NEGATIVE
Comment: NORMAL
Neisseria Gonorrhea: NEGATIVE

## 2021-05-27 ENCOUNTER — Other Ambulatory Visit (HOSPITAL_COMMUNITY): Payer: Self-pay

## 2021-05-28 ENCOUNTER — Other Ambulatory Visit (HOSPITAL_COMMUNITY): Payer: Self-pay

## 2021-05-28 LAB — HIV-1 RNA QUANT-NO REFLEX-BLD
HIV 1 RNA Quant: 370 Copies/mL — ABNORMAL HIGH
HIV-1 RNA Quant, Log: 2.57 Log cps/mL — ABNORMAL HIGH

## 2021-05-28 LAB — COMPREHENSIVE METABOLIC PANEL
AG Ratio: 1.3 (calc) (ref 1.0–2.5)
ALT: 29 U/L (ref 9–46)
AST: 19 U/L (ref 10–35)
Albumin: 3.9 g/dL (ref 3.6–5.1)
Alkaline phosphatase (APISO): 37 U/L (ref 35–144)
BUN: 13 mg/dL (ref 7–25)
CO2: 30 mmol/L (ref 20–32)
Calcium: 9.4 mg/dL (ref 8.6–10.3)
Chloride: 103 mmol/L (ref 98–110)
Creat: 0.93 mg/dL (ref 0.70–1.30)
Globulin: 3 g/dL (calc) (ref 1.9–3.7)
Glucose, Bld: 94 mg/dL (ref 65–99)
Potassium: 4.1 mmol/L (ref 3.5–5.3)
Sodium: 141 mmol/L (ref 135–146)
Total Bilirubin: 0.3 mg/dL (ref 0.2–1.2)
Total Protein: 6.9 g/dL (ref 6.1–8.1)

## 2021-05-28 LAB — BILIRUBIN, DIRECT: Bilirubin, Direct: 0.1 mg/dL (ref 0.0–0.2)

## 2021-05-28 MED ORDER — SULFAMETHOXAZOLE-TRIMETHOPRIM 800-160 MG PO TABS
ORAL_TABLET | ORAL | 0 refills | Status: DC
  Filled 2021-05-28: qty 28, 14d supply, fill #0

## 2021-05-29 NOTE — ED Provider Notes (Signed)
[facility] EMERGENCY DEPARTMENT Provider Note   CSN: [id number] Arrival date & time: 05/25/21  1222     History Chief Complaint  Patient presents with   Groin Injury    Eric Chapman. is a 56 y.o. male with h/o HIV presents to the ED for evaluation of groin swelling for the past 4 days. He reports that he thinks he may have had an injury to the groin from carrying tools in his pocket. He denies any pain, but mentions its an uncomfortable feeling. He denies any penile discharge, urinary frequency or urgency, dysuria, hematuria, abdominal pain, nausea, vomiting, or fever. He reports that he feels a "bump" in his right groin as well and is concerned it is a hernia. 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  HPI     Home Medications Prior to Admission medications   Medication Sig Start Date End Date Taking? Authorizing Provider  cefdinir (OMNICEF) 300 MG capsule Take 1 capsule (300 mg total) by mouth 2 (two) times daily for 10 days. 05/25/21 06/04/21 Yes Sherrell Puller, PA-C  darunavir (PREZISTA) 800 MG tablet TAKE 1 TABLET (800 MG TOTAL) BY MOUTH DAILY. Patient not taking: Reported on 01/20/2021 07/31/19   Carlyle Basques, MD  darunavir (PREZISTA) 800 MG tablet Take 1 tablet (800 mg total) by mouth daily. 12/03/20   Vu, Rockey Situ, MD  elvitegravir-cobicistat-emtricitabine-tenofovir (GENVOYA) 150-150-200-10 MG TABS tablet Take 1 tablet by mouth daily with breakfast. 12/03/20 12/03/21  Vu, Rockey Situ, MD  sulfamethoxazole-trimethoprim (BACTRIM DS) 800-160 MG tablet Take 1 tablet by mouth 2 times a day 05/28/21     testosterone cypionate (DEPO-TESTOSTERONE) 200 MG/ML injection Inject 0.5 mLs (100 mg total) into the muscle every 7 (seven) days. 09/23/20   Doree Albee, MD  testosterone cypionate (DEPOTESTOSTERONE CYPIONATE) 200 MG/ML injection Inject 0.5 MLs into the muscle once every 7 days 02/15/21   Prudencio Pair T, MD  TUBERCULIN SYR  1CC/25GX5/8" 25G X 5/8" 1 ML MISC  09/23/20   [provider]      Allergies    Oxycodone hcl and Percocet [oxycodone-acetaminophen]    Review of Systems   Review of Systems  Constitutional:  Negative for fever.  Gastrointestinal:  Negative for abdominal pain, constipation, diarrhea, nausea and vomiting.  Genitourinary:  Positive for penile swelling and scrotal swelling. Negative for penile discharge, penile pain and testicular pain.   Physical Exam Updated Vital Signs BP (!) 149/89    Pulse 83    Temp 98 F (36.7 C) (Oral)    Resp 16    Ht $R'5\' 10"'AV$  (1.778 m)    Wt 102.1 kg    SpO2 100%    BMI 32.28 kg/m  Physical Exam Vitals and nursing note reviewed. Exam conducted with a chaperone present Marya Amsler, EMT).  Constitutional:      Appearance: Normal appearance.  HENT:     Head: Normocephalic and atraumatic.  Eyes:     General: No scleral icterus. Cardiovascular:     Rate and Rhythm: Normal rate and regular rhythm.  Pulmonary:     Effort: Pulmonary effort is normal.     Breath sounds: Normal breath sounds.  Abdominal:     General: Abdomen is flat. Bowel sounds are normal.     Palpations: Abdomen is soft.     Tenderness: There is no abdominal tenderness. There is no guarding or rebound.  Genitourinary:    Penis: Circumcised.  Swelling present. No erythema, discharge or lesions.      Testes:        Right: Swelling present. Tenderness not present.        Left: Swelling present. Tenderness not present.     Comments: Circumcised male with penile swelling. No bruising or other overlying skin changes noted. Diffuse scrotal swelling with palpable testicles. No crepitus or swelling noted in the perineum.  Palpable lymph node in the right groin. Musculoskeletal:        General: No deformity.     Cervical back: Normal range of motion.  Skin:    General: Skin is warm and dry.  Neurological:     General: No focal deficit present.     Mental Status: He is alert. Mental status is at  baseline.    ED Results / Procedures / Treatments   Labs (all labs ordered are listed, but only abnormal results are displayed) Labs Reviewed  URINALYSIS, ROUTINE W REFLEX MICROSCOPIC - Abnormal; Notable for the following components:      Result Value   Hgb urine dipstick SMALL (*)    Protein, ur 30 (*)    All other components within normal limits  URINALYSIS, MICROSCOPIC (REFLEX) - Abnormal; Notable for the following components:   Bacteria, UA FEW (*)    All other components within normal limits  CBC WITH DIFFERENTIAL/PLATELET - Abnormal; Notable for the following components:   Monocytes Absolute 1.3 (*)    All other components within normal limits  COMPREHENSIVE METABOLIC PANEL - Abnormal; Notable for the following components:   Potassium 3.4 (*)    Glucose, Bld 104 (*)    Albumin 3.2 (*)    Alkaline Phosphatase 32 (*)    All other components within normal limits  URINE CULTURE  GC/CHLAMYDIA PROBE AMP () NOT AT [facility]    EKG None  Radiology CT ABDOMEN PELVIS W CONTRAST  Result Date: 05/25/2021 CLINICAL DATA:  Groin pain, and groin injury, scrotal injury EXAM: CT ABDOMEN AND PELVIS WITH CONTRAST TECHNIQUE: Multidetector CT imaging of the abdomen and pelvis was performed using the standard protocol following bolus administration of intravenous contrast. RADIATION DOSE REDUCTION: This exam was performed according to the departmental dose-optimization program which includes automated exposure control, adjustment of the mA and/or kV according to patient size and/or use of iterative reconstruction technique. CONTRAST:  141mL OMNIPAQUE IOHEXOL 300 MG/ML SOLN IV. No oral contrast. COMPARISON:  10/15/2015 FINDINGS: Lower chest: Lung bases clear Hepatobiliary: Gallbladder contracted.  Liver normal appearance. Pancreas: Normal appearance Spleen: Normal appearance Adrenals/Urinary Tract: Adrenal glands, kidneys, ureters, and bladder normal appearance Stomach/Bowel: Normal appendix.  Stomach and bowel loops normal appearance Vascular/Lymphatic: Vascular structures patent.  No adenopathy. Reproductive: Unremarkable prostate gland and seminal vesicle. Edema at the perineum extending to the base of the penis and into the scrotum. Other: No free air or free fluid. Scattered normal sized inguinal lymph nodes bilaterally. Small umbilical hernia containing fat. Musculoskeletal: Osseous structures unremarkable IMPRESSION: Edema at the perineum extending to the base of the penis and into the scrotum. Small umbilical hernia containing fat. No acute intra-abdominal or intrapelvic abnormalities. Electronically Signed   By: Lavonia Dana M.D.   On: 05/25/2021 15:57   US SCROTUM W/DOPPLER  Result Date: 05/25/2021 CLINICAL DATA:  Pain and swelling. EXAM: SCROTAL ULTRASOUND DOPPLER ULTRASOUND OF THE TESTICLES TECHNIQUE: Complete ultrasound examination of the testicles, epididymis, and other scrotal structures was performed. Color and spectral Doppler ultrasound were also utilized to evaluate blood flow to the  testicles. COMPARISON:  None. FINDINGS: Right testicle Measurements: 4.1 x 2.2 x 2.7 cm. No mass or microlithiasis visualized. Left testicle Measurements: 3.5 x 1.9 x 2.1 cm. No mass or microlithiasis visualized. Right epididymis: Normal in size and appearance. Small epididymal cysts, largest measures 5 mm. Left epididymis:  Left epididymal head cyst measuring up to 1.0 cm. Hydrocele:  None visualized. Varicocele:  None visualized. Pulsed Doppler interrogation of both testes demonstrates normal low resistance arterial and venous waveforms bilaterally. Other: Diffuse scrotal wall thickening. Palpable area in the right groin is compatible with prominent lymph node with normal fatty hilum. The lymph node measures 1.2 cm in short axis. IMPRESSION: 1. Normal appearance of both testicles. No evidence for testicular torsion. 2. Diffuse scrotal wall thickening. 3. Small epididymal cysts. Electronically Signed    By: Markus Daft M.D.   On: 05/25/2021 14:27     Procedures Procedures   Medications Ordered in ED Medications  cefTRIAXone (ROCEPHIN) 1 g in sodium chloride 0.9 % 100 mL IVPB (0 g Intravenous Stopped 05/25/21 1707)  iohexol (OMNIPAQUE) 300 MG/ML solution 100 mL (100 mLs Intravenous Contrast Given 05/25/21 1533)    ED Course/ Medical Decision Making/ A&P                           Medical Decision Making Amount and/or Complexity of Data Reviewed Labs: ordered. Radiology: ordered. ECG/medicine tests: ordered.  Risk Prescription drug management.   56 y/o M presents to the ED for evaluation of penile and scrotal swelling for the past 4 days. Differential diagnosis includes but is not limited to epididymitis versus orchitis versus torsion versus scrotal cellulitis. Vital signs show mildly elevated blood pressure, afebrile, normal heart rate, satting well on room air without any increase work of breath. Physical exam is pertinent for a circumcised male with penile swelling. No bruising or other overlying skin changes noted. Diffuse scrotal swelling with palpable testicles. No crepitus or swelling noted in the perineum. No penile drip noted. Non tender abdomen. Will order labs and imaging.   I independently reviewed and interpreted the labs and imaging and agree with the radiologist's interpretation.  CMP shows mildly decreased potassium 3.4.  Mildly elevated glucose at 104.  Decreased albumin at 3.2.  Decreased alk phos at 32.  Otherwise no electrolyte abnormalities.  CBC shows no signs of leukocytosis or anemia.  Urinalysis shows small amount of blood with 30 protein and on reflex AC 6-10 red blood cells and few bacteria but 0-5 white blood cells present.  Not consistent with UTI.  Urine culture pending.  GC pending.  Ultrasound of the scrotum shows 1. Normal appearance of both testicles. No evidence for testicular torsion. 2. Diffuse scrotal wall thickening. 3. Small epididymal cysts.  CT of the  abdomen pelvis shows  Edema at the perineum extending to the base of the penis and into the scrotum. Small umbilical hernia containing fat. No acute intra-abdominal or intrapelvic abnormalities.  At this time, have low suspicion for Fournier's given the lack of crepitus or swelling into the perineum along with reassuring CT scan.  Low suspicion for any epididymitis or orchitis given the ultrasound findings.  This is likely scrotal cellulitis for which we will place the patient on antibiotics for.  Discussed with pharmacy recommended [facility].  Patient received Rocephin while in the emergency department.  Discussed the lab and imaging findings with the patient.  Discussed the need for antibiotics to reduce the inflammation and infection.Marland Kitchen  Strict return precautions were discussed.  Follow-up with urology was given.  Patient agrees to plan.  Patient is stable being discharged home in good condition.  I discussed this case with my attending physician who cosigned this note including patient's presenting symptoms, physical exam, and planned diagnostics and interventions. Attending physician stated agreement with plan or made changes to plan which were implemented.   Attending physician assessed patient at bedside.   Final Clinical Impression(s) / ED Diagnoses Final diagnoses:  Cellulitis of other specified site    Rx / DC Orders ED Discharge Orders          Ordered    sulfamethoxazole-trimethoprim (BACTRIM DS) 800-160 MG tablet  2 times daily,   Status:  Discontinued        05/25/21 1722    cefdinir (OMNICEF) 300 MG capsule  2 times daily        05/25/21 1722              Sherrell Puller, PA-C 05/29/21 1252    Lennice Sites, DO 06/01/21 0700

## 2021-06-04 ENCOUNTER — Other Ambulatory Visit (HOSPITAL_COMMUNITY): Payer: Self-pay

## 2021-06-04 MED ORDER — SULFAMETHOXAZOLE-TRIMETHOPRIM 800-160 MG PO TABS
ORAL_TABLET | ORAL | 0 refills | Status: DC
  Filled 2021-06-04: qty 28, 14d supply, fill #0

## 2021-06-22 ENCOUNTER — Encounter: Payer: BC Managed Care – PPO | Admitting: *Deleted

## 2021-06-23 ENCOUNTER — Encounter: Payer: BC Managed Care – PPO | Admitting: *Deleted

## 2021-06-28 ENCOUNTER — Other Ambulatory Visit: Payer: Self-pay

## 2021-06-28 ENCOUNTER — Encounter (INDEPENDENT_AMBULATORY_CARE_PROVIDER_SITE_OTHER): Payer: Self-pay | Admitting: *Deleted

## 2021-06-28 VITALS — BP 146/94 | HR 80 | Temp 98.2°F | Wt 228.0 lb

## 2021-06-28 DIAGNOSIS — Z006 Encounter for examination for normal comparison and control in clinical research program: Secondary | ICD-10-CM

## 2021-06-29 NOTE — Research (Signed)
Xylan was here for his week 16 visit for [id number]. The visit was delayed because he kept missing appts. He has been busy at work and missing doses of his medications. His scrotal swelling has resolved. We did talk about counseling and he is going to check with his insurance company and see what it will cover and which providers he can go to. He knows he only has 1 more month to get suppressed to have a chance to move in to the injectable phase of the study. ?

## 2021-07-15 ENCOUNTER — Other Ambulatory Visit: Payer: Self-pay

## 2021-07-15 ENCOUNTER — Encounter (INDEPENDENT_AMBULATORY_CARE_PROVIDER_SITE_OTHER): Payer: 59 | Admitting: *Deleted

## 2021-07-15 VITALS — BP 126/86 | HR 81 | Temp 98.2°F | Wt 219.4 lb

## 2021-07-15 DIAGNOSIS — Z006 Encounter for examination for normal comparison and control in clinical research program: Secondary | ICD-10-CM

## 2021-07-15 MED ORDER — STUDY - A5359 (STEP 2) - RILPIVERINE 900MG/3ML INJECTION (PI-VAN DAM)
900.0000 mg | INJECTION | Freq: Once | INTRAMUSCULAR | Status: DC
  Filled 2021-07-15: qty 3

## 2021-07-15 MED ORDER — STUDY - A5359 (STEP 2) - CABOTEGRAVIR 600MG/3ML INJECTION (PI-VAN DAM)
600.0000 mg | INJECTION | Freq: Once | INTRAMUSCULAR | Status: DC
  Filled 2021-07-15: qty 3

## 2021-07-15 NOTE — Research (Signed)
Eric Chapman was here today for his step 2 entry visit. He was randomized to receive cabaneuva injections which will start today and continue every 4 weeks. They were given without problem. He was instructed to treat pain and swelling with ice, ibuprofen, etc. And to let us knoweif he had any significant ISRs. He will be returning in 4 weeks. ?

## 2021-07-17 LAB — LIPID PANEL
Cholesterol: 138 mg/dL (ref <200)
HDL: 34 mg/dL — ABNORMAL LOW (ref >=40)
LDL Cholesterol (Calc): 88 mg/dL (calc)
Non-HDL Cholesterol (Calc): 104 mg/dL (calc) (ref <130)
Total CHOL/HDL Ratio: 4.1 (calc) (ref <5.0)
Triglycerides: 74 mg/dL (ref <150)

## 2021-07-17 LAB — HIV-1 RNA QUANT-NO REFLEX-BLD
HIV 1 RNA Quant: 1330 copies/mL — ABNORMAL HIGH
HIV-1 RNA Quant, Log: 3.12 Log copies/mL — ABNORMAL HIGH

## 2021-07-19 LAB — HIV-1 RNA QUANT-NO REFLEX-BLD
HIV 1 RNA Quant: 32 Copies/mL — ABNORMAL HIGH
HIV-1 RNA Quant, Log: 1.51 Log cps/mL — ABNORMAL HIGH

## 2021-08-06 ENCOUNTER — Ambulatory Visit: Payer: BC Managed Care – PPO | Admitting: Internal Medicine

## 2021-08-12 ENCOUNTER — Encounter: Payer: 59 | Admitting: *Deleted

## 2021-08-17 ENCOUNTER — Other Ambulatory Visit: Payer: Self-pay

## 2021-08-17 ENCOUNTER — Encounter: Payer: 59 | Admitting: *Deleted

## 2021-08-17 ENCOUNTER — Other Ambulatory Visit: Payer: Self-pay | Admitting: *Deleted

## 2021-08-17 VITALS — BP 137/88 | HR 81 | Temp 97.8°F | Wt 227.5 lb

## 2021-08-17 DIAGNOSIS — Z006 Encounter for examination for normal comparison and control in clinical research program: Secondary | ICD-10-CM

## 2021-08-17 MED ORDER — STUDY - A5359 (STEP 2) - CABOTEGRAVIR 600MG/3ML INJECTION (PI-VAN DAM)
400.0000 mg | INJECTION | INTRAMUSCULAR | Status: DC
  Filled 2021-08-17: qty 2

## 2021-08-17 MED ORDER — STUDY - A5359 (STEP 2) - RILPIVERINE 900MG/3ML INJECTION (PI-VAN DAM)
600.0000 mg | INJECTION | INTRAMUSCULAR | Status: DC
  Filled 2021-08-17: qty 2

## 2021-08-17 NOTE — Research (Signed)
Eric Chapman is here for his week 4 step 2 visit for [id number]. He had his first cabaneuva injections last month and only had 1 day of soreness at both injection sites.  He was given more injections at this visit and tolerated them well. He has developed a stye on his left lower eyelid. I recommended he use some warm compresses to help with comfort. He has noticed some dizziness over the past few weeks with motion. He will be returning every 4 weeks for study visits.

## 2021-08-18 LAB — CBC WITH DIFFERENTIAL/PLATELET
Absolute Monocytes: 538 cells/uL (ref 200–950)
Basophils Absolute: 34 cells/uL (ref 0–200)
Basophils Relative: 0.4 %
Eosinophils Absolute: 168 cells/uL (ref 15–500)
Eosinophils Relative: 2 %
HCT: 46.1 % (ref 38.5–50.0)
Hemoglobin: 15.1 g/dL (ref 13.2–17.1)
Lymphs Abs: 1327 cells/uL (ref 850–3900)
MCH: 28.7 pg (ref 27.0–33.0)
MCHC: 32.8 g/dL (ref 32.0–36.0)
MCV: 87.5 fL (ref 80.0–100.0)
MPV: 9.5 fL (ref 7.5–12.5)
Monocytes Relative: 6.4 %
Neutro Abs: 6334 cells/uL (ref 1500–7800)
Neutrophils Relative %: 75.4 %
Platelets: 356 10*3/uL (ref 140–400)
RBC: 5.27 10*6/uL (ref 4.20–5.80)
RDW: 12.3 % (ref 11.0–15.0)
Total Lymphocyte: 15.8 %
WBC: 8.4 10*3/uL (ref 3.8–10.8)

## 2021-08-18 LAB — COMPREHENSIVE METABOLIC PANEL
AG Ratio: 1.4 (calc) (ref 1.0–2.5)
ALT: 19 U/L (ref 9–46)
AST: 19 U/L (ref 10–35)
Albumin: 4.2 g/dL (ref 3.6–5.1)
Alkaline phosphatase (APISO): 41 U/L (ref 35–144)
BUN: 10 mg/dL (ref 7–25)
CO2: 28 mmol/L (ref 20–32)
Calcium: 9.5 mg/dL (ref 8.6–10.3)
Chloride: 102 mmol/L (ref 98–110)
Creat: 0.95 mg/dL (ref 0.70–1.30)
Globulin: 2.9 g/dL (calc) (ref 1.9–3.7)
Glucose, Bld: 120 mg/dL — ABNORMAL HIGH (ref 65–99)
Potassium: 3.7 mmol/L (ref 3.5–5.3)
Sodium: 138 mmol/L (ref 135–146)
Total Bilirubin: 0.7 mg/dL (ref 0.2–1.2)
Total Protein: 7.1 g/dL (ref 6.1–8.1)

## 2021-08-18 LAB — BILIRUBIN, DIRECT: Bilirubin, Direct: 0.1 mg/dL (ref 0.0–0.2)

## 2021-08-19 ENCOUNTER — Encounter (INDEPENDENT_AMBULATORY_CARE_PROVIDER_SITE_OTHER): Payer: Self-pay

## 2021-08-19 ENCOUNTER — Other Ambulatory Visit: Payer: Self-pay

## 2021-08-19 DIAGNOSIS — Z006 Encounter for examination for normal comparison and control in clinical research program: Secondary | ICD-10-CM

## 2021-08-19 NOTE — Research (Signed)
Participant brought back in to recollect labs for research due to processing error.

## 2021-09-08 ENCOUNTER — Other Ambulatory Visit (HOSPITAL_COMMUNITY): Payer: Self-pay

## 2021-09-13 ENCOUNTER — Other Ambulatory Visit: Payer: Self-pay

## 2021-09-13 ENCOUNTER — Encounter (INDEPENDENT_AMBULATORY_CARE_PROVIDER_SITE_OTHER): Payer: Self-pay | Admitting: *Deleted

## 2021-09-13 VITALS — BP 130/87 | HR 78 | Temp 98.3°F | Wt 226.1 lb

## 2021-09-13 DIAGNOSIS — Z006 Encounter for examination for normal comparison and control in clinical research program: Secondary | ICD-10-CM

## 2021-09-13 MED ORDER — STUDY - A5359 (STEP 2) - RILPIVERINE 900MG/3ML INJECTION (PI-VAN DAM)
600.0000 mg | INJECTION | INTRAMUSCULAR | Status: DC
  Administered 2021-09-13: 600 mg via INTRAMUSCULAR
  Filled 2021-09-13: qty 2

## 2021-09-13 MED ORDER — STUDY - A5359 (STEP 2) - CABOTEGRAVIR 600MG/3ML INJECTION (PI-VAN DAM)
400.0000 mg | INJECTION | INTRAMUSCULAR | Status: DC
  Administered 2021-09-13: 400 mg via INTRAMUSCULAR
  Filled 2021-09-13: qty 2

## 2021-09-13 NOTE — Research (Signed)
Eric Chapman was here for his week 8, step 2 visit for A5359. He said he only had some mild tenderness from both injection sites for 1 day after the last injections. He did not take anything for it. But he did have a migraine the next day and took ibuprofen for relief.  He tolerated the injections given today without any problem. No other issues noted. He will return in 4 weeks.

## 2021-09-15 LAB — HEPATIC FUNCTION PANEL
AG Ratio: 1.6 (calc) (ref 1.0–2.5)
ALT: 27 U/L (ref 9–46)
AST: 23 U/L (ref 10–35)
Albumin: 4.1 g/dL (ref 3.6–5.1)
Alkaline phosphatase (APISO): 38 U/L (ref 35–144)
Bilirubin, Direct: 0.1 mg/dL (ref 0.0–0.2)
Globulin: 2.6 g/dL (calc) (ref 1.9–3.7)
Indirect Bilirubin: 0.4 mg/dL (calc) (ref 0.2–1.2)
Total Bilirubin: 0.5 mg/dL (ref 0.2–1.2)
Total Protein: 6.7 g/dL (ref 6.1–8.1)

## 2021-09-15 LAB — HIV-1 RNA QUANT-NO REFLEX-BLD
HIV 1 RNA Quant: 52 Copies/mL — ABNORMAL HIGH
HIV-1 RNA Quant, Log: 1.72 Log cps/mL — ABNORMAL HIGH

## 2021-09-16 LAB — HIV-1 RNA QUANT-NO REFLEX-BLD
HIV 1 RNA Quant: 129 copies/mL — ABNORMAL HIGH
HIV-1 RNA Quant, Log: 2.11 Log copies/mL — ABNORMAL HIGH

## 2021-10-08 ENCOUNTER — Encounter: Payer: 59 | Admitting: *Deleted

## 2021-10-08 ENCOUNTER — Encounter (INDEPENDENT_AMBULATORY_CARE_PROVIDER_SITE_OTHER): Payer: 59 | Admitting: *Deleted

## 2021-10-08 ENCOUNTER — Other Ambulatory Visit: Payer: Self-pay

## 2021-10-08 ENCOUNTER — Other Ambulatory Visit: Payer: Self-pay | Admitting: *Deleted

## 2021-10-08 DIAGNOSIS — Z006 Encounter for examination for normal comparison and control in clinical research program: Secondary | ICD-10-CM

## 2021-10-08 MED ORDER — STUDY - A5359 (STEP 2) - CABOTEGRAVIR 600MG/3ML INJECTION (PI-VAN DAM): 400.0000 mg | INJECTION | INTRAMUSCULAR | 0 refills | Status: DC

## 2021-10-08 MED ORDER — STUDY - A5359 (STEP 2) - CABOTEGRAVIR 600MG/3ML INJECTION (PI-VAN DAM)
400.0000 mg | INJECTION | INTRAMUSCULAR | Status: DC
  Administered 2021-11-08: 400 mg via INTRAMUSCULAR
  Filled 2021-10-08 (×2): qty 2

## 2021-10-08 MED ORDER — STUDY - A5359 (STEP 2) - RILPIVERINE 900MG/3ML INJECTION (PI-VAN DAM): 600.0000 mg | INJECTION | INTRAMUSCULAR | 0 refills | Status: DC

## 2021-10-08 MED ORDER — STUDY - A5359 (STEP 2) - RILPIVERINE 900MG/3ML INJECTION (PI-VAN DAM)
600.0000 mg | INJECTION | INTRAMUSCULAR | Status: DC
  Administered 2021-11-08: 600 mg via INTRAMUSCULAR
  Filled 2021-10-08 (×2): qty 2

## 2021-10-11 NOTE — Research (Signed)
Eric Chapman was here for his week 12 visit for [id number]. He denies any new problems or medications. He had some soreness on his rt injection site that started 2 days after the injection but only lasted 1 day.  He received the cabaneuva injections today with out any problem and will be returning in 4 weeks.

## 2021-11-05 ENCOUNTER — Encounter: Payer: 59 | Admitting: *Deleted

## 2021-11-05 ENCOUNTER — Other Ambulatory Visit: Payer: Self-pay | Admitting: *Deleted

## 2021-11-05 DIAGNOSIS — Z006 Encounter for examination for normal comparison and control in clinical research program: Secondary | ICD-10-CM

## 2021-11-08 ENCOUNTER — Encounter: Payer: 59 | Admitting: *Deleted

## 2021-11-08 ENCOUNTER — Other Ambulatory Visit: Payer: Self-pay | Admitting: *Deleted

## 2021-11-08 ENCOUNTER — Encounter (INDEPENDENT_AMBULATORY_CARE_PROVIDER_SITE_OTHER): Payer: 59 | Admitting: *Deleted

## 2021-11-08 ENCOUNTER — Other Ambulatory Visit: Payer: Self-pay

## 2021-11-08 VITALS — BP 131/90 | HR 80 | Temp 98.2°F | Wt 229.5 lb

## 2021-11-08 DIAGNOSIS — Z006 Encounter for examination for normal comparison and control in clinical research program: Secondary | ICD-10-CM

## 2021-11-08 MED ORDER — STUDY - A5359 (STEP 2) - CABOTEGRAVIR 600MG/3ML INJECTION (PI-VAN DAM): 400.0000 mg | INJECTION | INTRAMUSCULAR | 0 refills | Status: DC

## 2021-11-08 MED ORDER — STUDY - A5359 (STEP 2) - RILPIVERINE 900MG/3ML INJECTION (PI-VAN DAM): 600.0000 mg | INJECTION | INTRAMUSCULAR | 0 refills | Status: DC

## 2021-11-08 NOTE — Research (Signed)
Eric Chapman was here for his week 16, step 2 visit for the [employer] study. He denies any new problems. He did have another migraine after his last injection and now thinks that it is related to the injections. He treated it with tylenol but did miss work because of it. He was given cabaneuva injections without any problem, and will return in 4 weeks for the next visit.

## 2021-11-08 NOTE — Telephone Encounter (Signed)
Discontinued one CAB order to avoid duplication

## 2021-11-11 LAB — HEPATIC FUNCTION PANEL
AG Ratio: 1.5 (calc) (ref 1.0–2.5)
ALT: 31 U/L (ref 9–46)
AST: 23 U/L (ref 10–35)
Albumin: 4.3 g/dL (ref 3.6–5.1)
Alkaline phosphatase (APISO): 36 U/L (ref 35–144)
Bilirubin, Direct: 0.1 mg/dL (ref 0.0–0.2)
Globulin: 2.9 g/dL (calc) (ref 1.9–3.7)
Indirect Bilirubin: 0.6 mg/dL (calc) (ref 0.2–1.2)
Total Bilirubin: 0.7 mg/dL (ref 0.2–1.2)
Total Protein: 7.2 g/dL (ref 6.1–8.1)

## 2021-11-11 LAB — HIV-1 RNA QUANT-NO REFLEX-BLD
HIV 1 RNA Quant: NOT DETECTED Copies/mL
HIV-1 RNA Quant, Log: NOT DETECTED Log cps/mL

## 2021-12-01 ENCOUNTER — Other Ambulatory Visit: Payer: Self-pay | Admitting: *Deleted

## 2021-12-03 ENCOUNTER — Encounter (INDEPENDENT_AMBULATORY_CARE_PROVIDER_SITE_OTHER): Payer: Self-pay | Admitting: *Deleted

## 2021-12-03 ENCOUNTER — Other Ambulatory Visit: Payer: Self-pay

## 2021-12-03 VITALS — BP 135/88 | HR 83 | Temp 98.2°F | Wt 224.6 lb

## 2021-12-03 DIAGNOSIS — Z006 Encounter for examination for normal comparison and control in clinical research program: Secondary | ICD-10-CM

## 2021-12-03 MED ORDER — STUDY - A5359 (STEP 2) - CABOTEGRAVIR 600MG/3ML INJECTION (PI-VAN DAM): 400.0000 mg | INJECTION | INTRAMUSCULAR | Status: DC

## 2021-12-03 MED ORDER — STUDY - A5359 (STEP 2) - RILPIVERINE 900MG/3ML INJECTION (PI-VAN DAM)
600.0000 mg | INJECTION | INTRAMUSCULAR | Status: DC
  Administered 2021-12-03: 600 mg via INTRAMUSCULAR
  Filled 2021-12-03: qty 2

## 2021-12-03 MED ORDER — STUDY - A5359 (STEP 2) - CABOTEGRAVIR 600MG/3ML INJECTION (PI-VAN DAM)
400.0000 mg | INJECTION | INTRAMUSCULAR | Status: DC
  Administered 2021-12-03: 400 mg via INTRAMUSCULAR
  Filled 2021-12-03: qty 2

## 2021-12-03 NOTE — Research (Signed)
Eric Chapman was in today for his week 20 visit for [id number]. He did say his last cabotegravir injection was sorer than usual and he did feel a small knot for a few days at the site. He also didn't feel well (achy and headache) 2 days after the last injections for 1 day. He was given both injections today without any problem and will be returning in 4 weeks for the next visit.

## 2021-12-30 ENCOUNTER — Encounter (INDEPENDENT_AMBULATORY_CARE_PROVIDER_SITE_OTHER): Payer: Self-pay | Admitting: *Deleted

## 2021-12-30 ENCOUNTER — Other Ambulatory Visit: Payer: Self-pay

## 2021-12-30 DIAGNOSIS — Z006 Encounter for examination for normal comparison and control in clinical research program: Secondary | ICD-10-CM

## 2021-12-30 MED ORDER — STUDY - A5359 (STEP 2) - CABOTEGRAVIR 600MG/3ML INJECTION (PI-VAN DAM)
400.0000 mg | INJECTION | Freq: Once | INTRAMUSCULAR | Status: AC
  Administered 2021-12-30: 400 mg via INTRAMUSCULAR
  Filled 2021-12-30: qty 2

## 2021-12-30 MED ORDER — STUDY - A5359 (STEP 2) - RILPIVERINE 900MG/3ML INJECTION (PI-VAN DAM)
600.0000 mg | INJECTION | Freq: Once | INTRAMUSCULAR | Status: AC
  Administered 2021-12-30: 600 mg via INTRAMUSCULAR
  Filled 2021-12-30: qty 2

## 2021-12-30 NOTE — Research (Signed)
Eric Chapman was here for his week 24 step 2 visit for [id number]. He denies any new problems or medications. He denied any ISR from the last injections. He was given the cabaneuva injections without any problem today and will be returning in 4 weeks for the next visit.

## 2022-01-02 LAB — COMPREHENSIVE METABOLIC PANEL
AG Ratio: 1.8 (calc) (ref 1.0–2.5)
ALT: 29 U/L (ref 9–46)
AST: 26 U/L (ref 10–35)
Albumin: 4.4 g/dL (ref 3.6–5.1)
Alkaline phosphatase (APISO): 34 U/L — ABNORMAL LOW (ref 35–144)
BUN: 11 mg/dL (ref 7–25)
CO2: 25 mmol/L (ref 20–32)
Calcium: 9 mg/dL (ref 8.6–10.3)
Chloride: 102 mmol/L (ref 98–110)
Creat: 1.07 mg/dL (ref 0.70–1.30)
Globulin: 2.5 g/dL (calc) (ref 1.9–3.7)
Glucose, Bld: 98 mg/dL (ref 65–99)
Potassium: 4.2 mmol/L (ref 3.5–5.3)
Sodium: 136 mmol/L (ref 135–146)
Total Bilirubin: 0.6 mg/dL (ref 0.2–1.2)
Total Protein: 6.9 g/dL (ref 6.1–8.1)

## 2022-01-02 LAB — BILIRUBIN, DIRECT: Bilirubin, Direct: 0.1 mg/dL (ref 0.0–0.2)

## 2022-01-02 LAB — HIV-1 RNA QUANT-NO REFLEX-BLD
HIV 1 RNA Quant: <20 Copies/mL — ABNORMAL HIGH
HIV-1 RNA Quant, Log: <1.3 Log cps/mL — ABNORMAL HIGH

## 2022-01-27 ENCOUNTER — Encounter (INDEPENDENT_AMBULATORY_CARE_PROVIDER_SITE_OTHER): Payer: Self-pay | Admitting: *Deleted

## 2022-01-27 ENCOUNTER — Other Ambulatory Visit: Payer: Self-pay

## 2022-01-27 VITALS — BP 149/94 | HR 73 | Temp 97.7°F | Wt 230.3 lb

## 2022-01-27 DIAGNOSIS — Z006 Encounter for examination for normal comparison and control in clinical research program: Secondary | ICD-10-CM

## 2022-01-27 MED ORDER — STUDY - A5359 (STEP 2) - CABOTEGRAVIR 600MG/3ML INJECTION (PI-VAN DAM)
400.0000 mg | INJECTION | Freq: Once | INTRAMUSCULAR | Status: AC
  Administered 2022-01-27: 400 mg via INTRAMUSCULAR
  Filled 2022-01-27: qty 2

## 2022-01-27 MED ORDER — STUDY - A5359 (STEP 2) - RILPIVERINE 900MG/3ML INJECTION (PI-VAN DAM)
600.0000 mg | INJECTION | Freq: Once | INTRAMUSCULAR | Status: AC
  Administered 2022-01-27: 600 mg via INTRAMUSCULAR
  Filled 2022-01-27: qty 2

## 2022-01-27 NOTE — Research (Signed)
Eric Chapman was here for his week 28 visit for [id number]. He denies any new problems or concerns. His BP was up a little bit as well as his weight. He is trying to find a new GP now that he has different insurance. He denies any issues with the last cabaneuva injections and will be returning in 4 weeks for the next visit

## 2022-02-24 ENCOUNTER — Other Ambulatory Visit: Payer: Self-pay

## 2022-02-24 ENCOUNTER — Encounter: Payer: Self-pay | Admitting: *Deleted

## 2022-02-24 VITALS — BP 155/95 | HR 89 | Temp 98.0°F | Wt 234.4 lb

## 2022-02-24 DIAGNOSIS — Z006 Encounter for examination for normal comparison and control in clinical research program: Secondary | ICD-10-CM

## 2022-02-24 MED ORDER — STUDY - A5359 (STEP 2) - CABOTEGRAVIR 600MG/3ML INJECTION (PI-VAN DAM)
400.0000 mg | INJECTION | Freq: Once | INTRAMUSCULAR | Status: AC
  Administered 2022-02-24: 400 mg via INTRAMUSCULAR
  Filled 2022-02-24: qty 2

## 2022-02-24 MED ORDER — STUDY - A5359 (STEP 2) - RILPIVERINE 900MG/3ML INJECTION (PI-VAN DAM)
600.0000 mg | INJECTION | Freq: Once | INTRAMUSCULAR | Status: AC
  Administered 2022-02-24: 600 mg via INTRAMUSCULAR
  Filled 2022-02-24: qty 2

## 2022-02-24 NOTE — Research (Signed)
Eric Chapman was here today for his week 52 step 2 visit for [id number]. He denies any new problems or medications. His BP was up some and he has gained 10 lbs over past 3 months. He is not on any BP med and will be seeing his primary MD soon.  He was given the cabaneuva injections without any problem and denied any problems associated with his last ones. He will be due back at the end of December.

## 2022-02-25 LAB — HEPATIC FUNCTION PANEL
AG Ratio: 1.5 (calc) (ref 1.0–2.5)
ALT: 26 U/L (ref 9–46)
AST: 18 U/L (ref 10–35)
Albumin: 4.4 g/dL (ref 3.6–5.1)
Alkaline phosphatase (APISO): 39 U/L (ref 35–144)
Bilirubin, Direct: 0.1 mg/dL (ref 0.0–0.2)
Globulin: 3 g/dL (calc) (ref 1.9–3.7)
Indirect Bilirubin: 0.2 mg/dL (calc) (ref 0.2–1.2)
Total Bilirubin: 0.3 mg/dL (ref 0.2–1.2)
Total Protein: 7.4 g/dL (ref 6.1–8.1)

## 2022-03-16 IMAGING — CT CT ABD-PELV W/ CM
2 of 5 series · 15 of 46 positions shown, 17 images · IV contrast (Omnipaque)
Comparison: 10/15/2015

CLINICAL DATA: Groin pain, and groin injury, scrotal injury

EXAM:
CT ABDOMEN AND PELVIS WITH CONTRAST
TECHNIQUE: Multidetector CT imaging of the abdomen and pelvis was performed
using the standard protocol following bolus administration of
intravenous contrast.

[Series 2: axial st · axial · 0.93mm/px · z∈[-752,-332]mm · 12 of 100 slices shown, 14 images]
[im 8/100  soft-tissue]
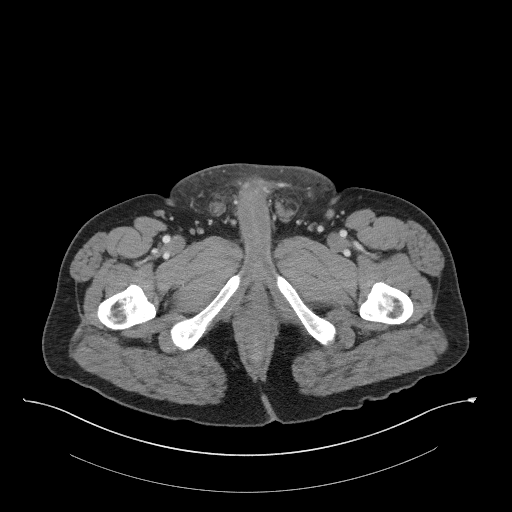
[im 8/100  bone]
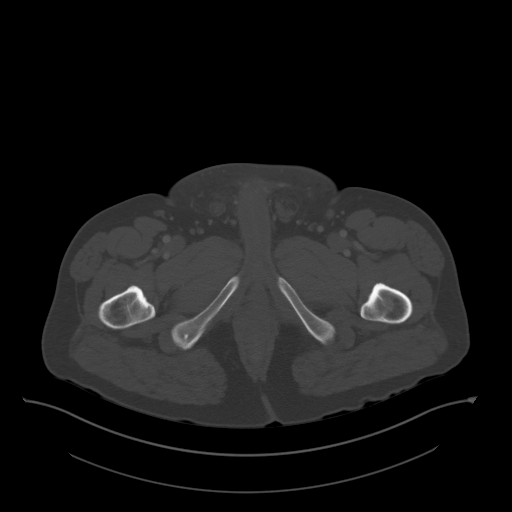
[im 16/100  soft-tissue]
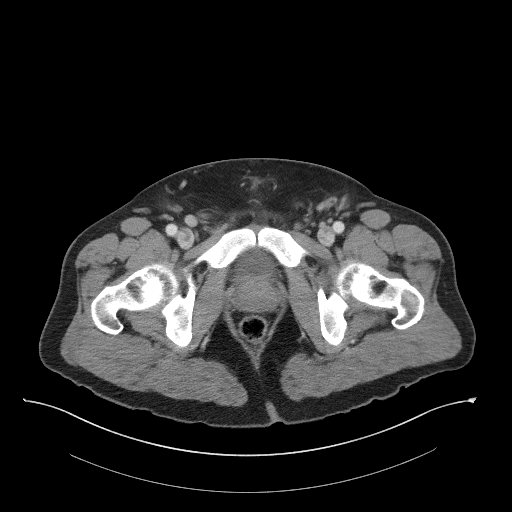
[im 23/100  soft-tissue]
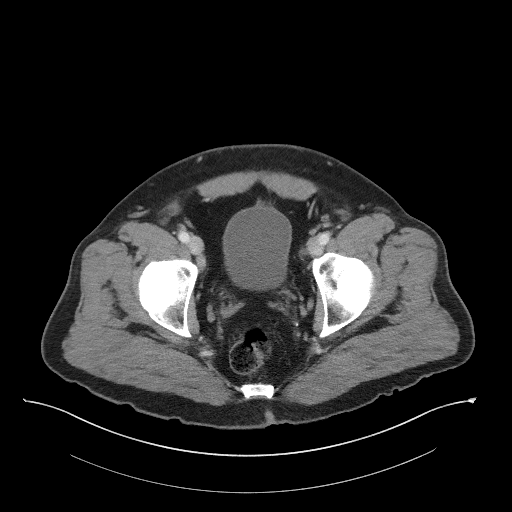
[im 31/100  soft-tissue]
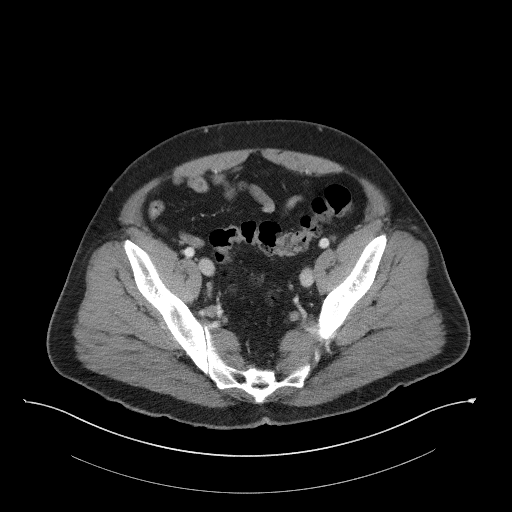
[im 39/100  soft-tissue]
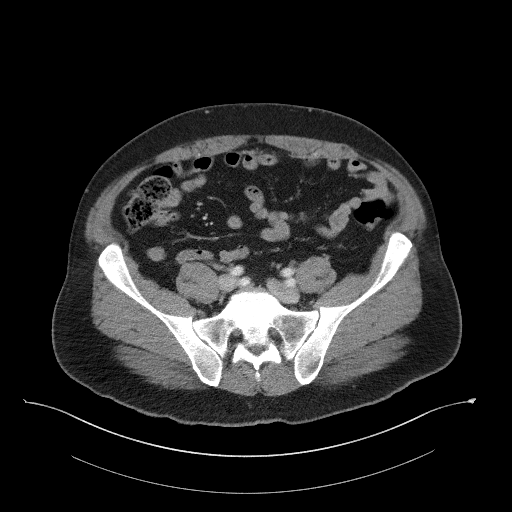
[im 46/100  soft-tissue]
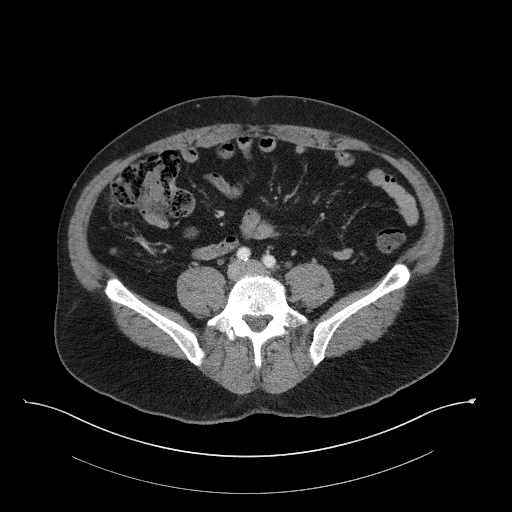
[im 54/100  soft-tissue]
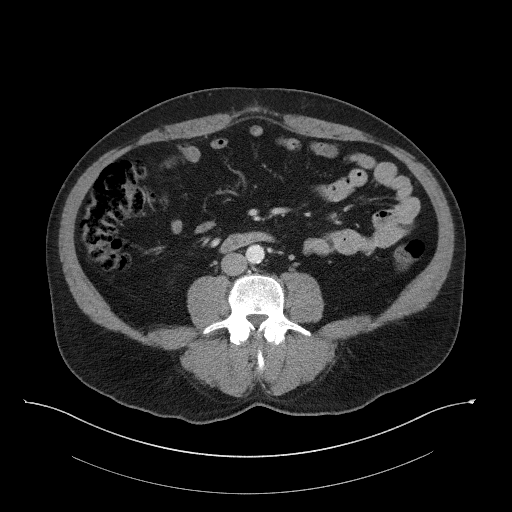
[im 61/100  soft-tissue]
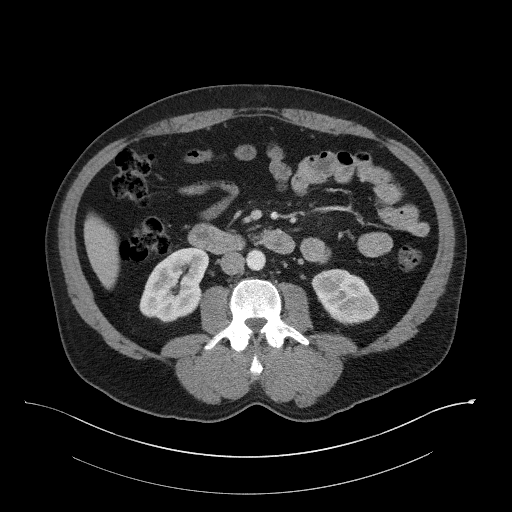
[im 69/100  soft-tissue]
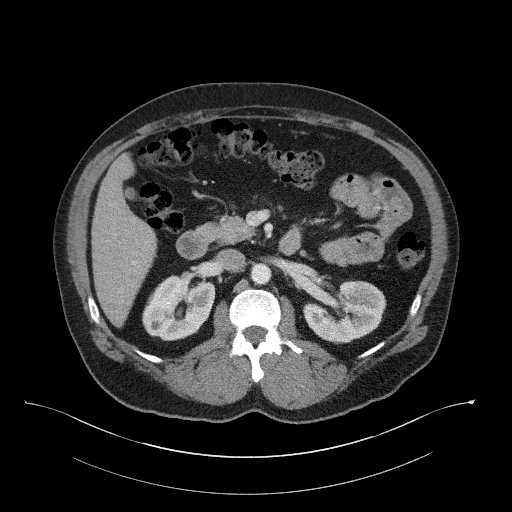
[im 69/100  bone]
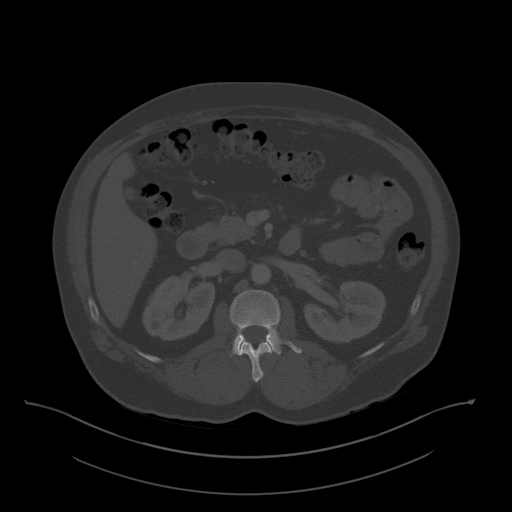
[im 77/100  soft-tissue]
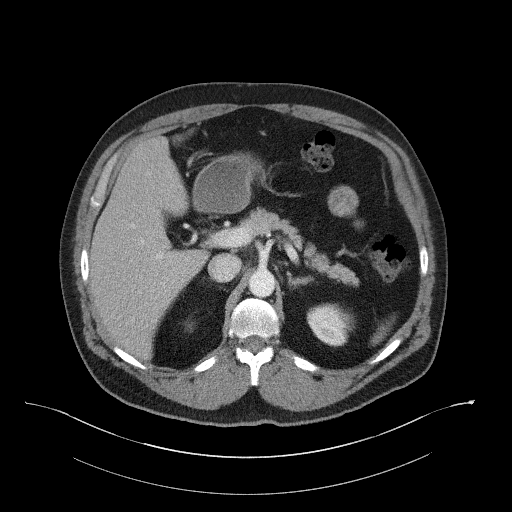
[im 84/100  soft-tissue]
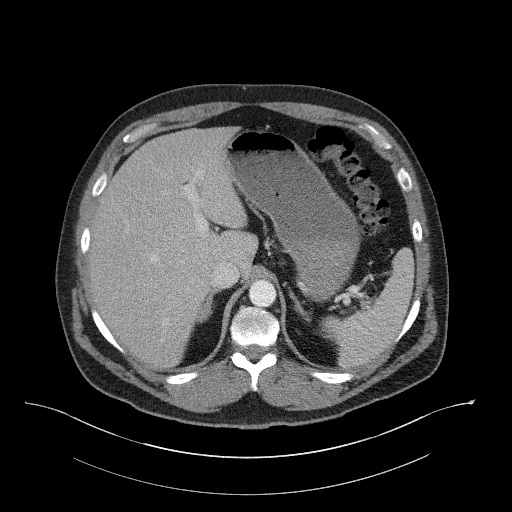
[im 92/100  soft-tissue]
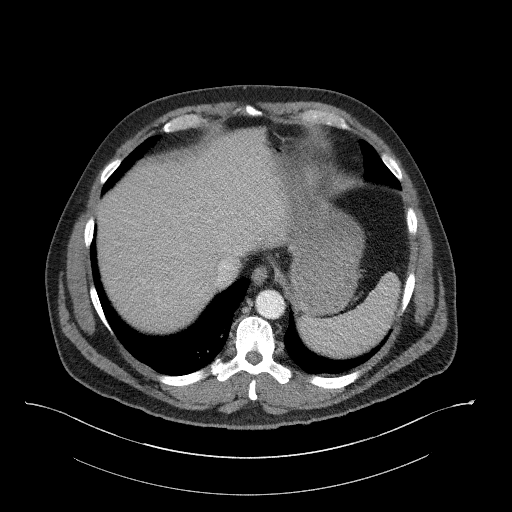

[Series 5: coronal st · coronal · 0.87mm/px · 3 of 115 slices shown]
[im 39/115  soft-tissue]
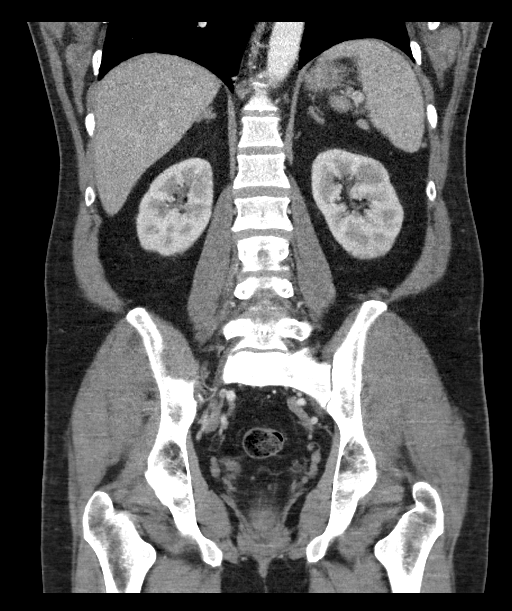
[im 51/115  soft-tissue]
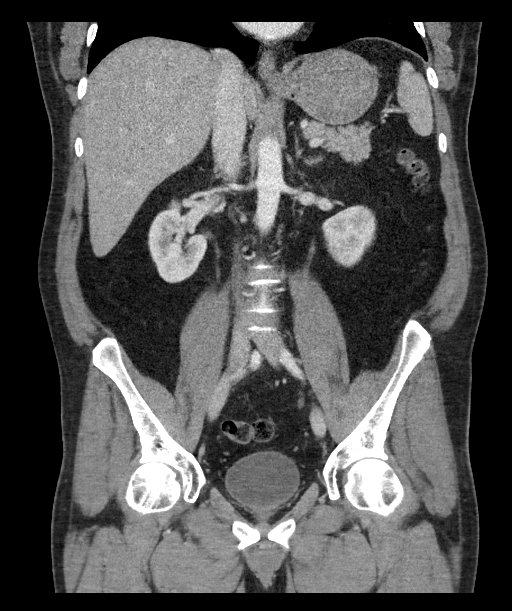
[im 64/115  soft-tissue]
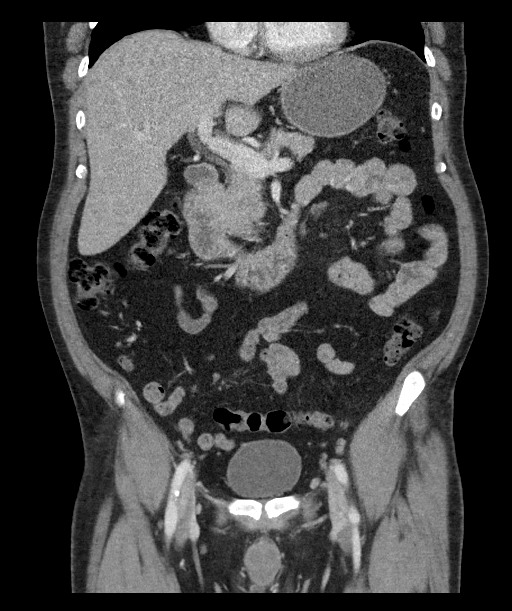

[15 of 46 positions shown; findings below may reference images not displayed]

RADIATION DOSE REDUCTION: This exam was performed according to the
departmental dose-optimization program which includes automated
exposure control, adjustment of the mA and/or kV according to
patient size and/or use of iterative reconstruction technique.

CONTRAST:  100mL OMNIPAQUE IOHEXOL 300 MG/ML SOLN IV. No oral
contrast.
FINDINGS: Lower chest: Lung bases clear

Hepatobiliary: Gallbladder contracted.  Liver normal appearance.

Pancreas: Normal appearance

Spleen: Normal appearance

Adrenals/Urinary Tract: Adrenal glands, kidneys, ureters, and
bladder normal appearance

Stomach/Bowel: Normal appendix. Stomach and bowel loops normal
appearance

Vascular/Lymphatic: Vascular structures patent.  No adenopathy.

Reproductive: Unremarkable prostate gland and seminal vesicle. Edema
at the perineum extending to the base of the penis and into the
scrotum.

Other: No free air or free fluid. Scattered normal sized inguinal
lymph nodes bilaterally. Small umbilical hernia containing fat.

Musculoskeletal: Osseous structures unremarkable
IMPRESSION: Edema at the perineum extending to the base of the penis and into
the scrotum.

Small umbilical hernia containing fat.

No acute intra-abdominal or intrapelvic abnormalities.

## 2022-03-16 IMAGING — US US SCROTUM W/ DOPPLER COMPLETE
3 series · 13 of 25 positions shown · non-contrast
Comparison: None.

CLINICAL DATA: Pain and swelling.

EXAM:
SCROTAL ULTRASOUND
DOPPLER ULTRASOUND OF THE TESTICLES
TECHNIQUE: Complete ultrasound examination of the testicles, epididymis, and
other scrotal structures was performed. Color and spectral Doppler
ultrasound were also utilized to evaluate blood flow to the
testicles.

[Series 1: us scrotum w/ doppler complete · 11 of 35 slices shown (1 of 3)]
[im 1/35]
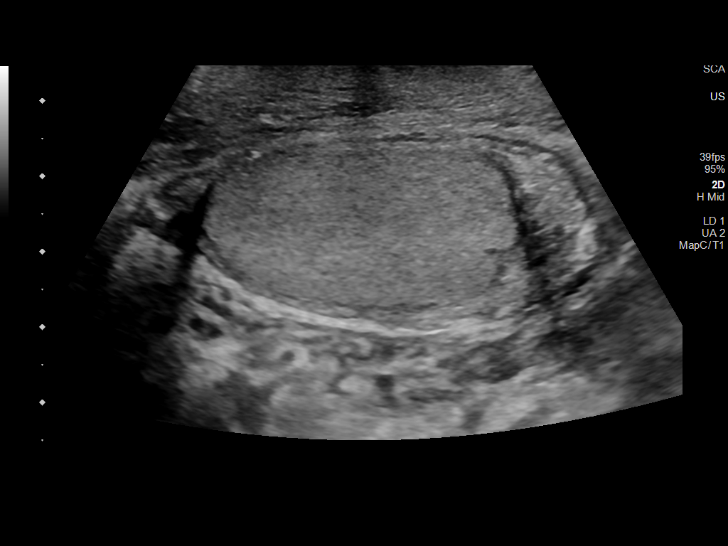
[im 4/35]
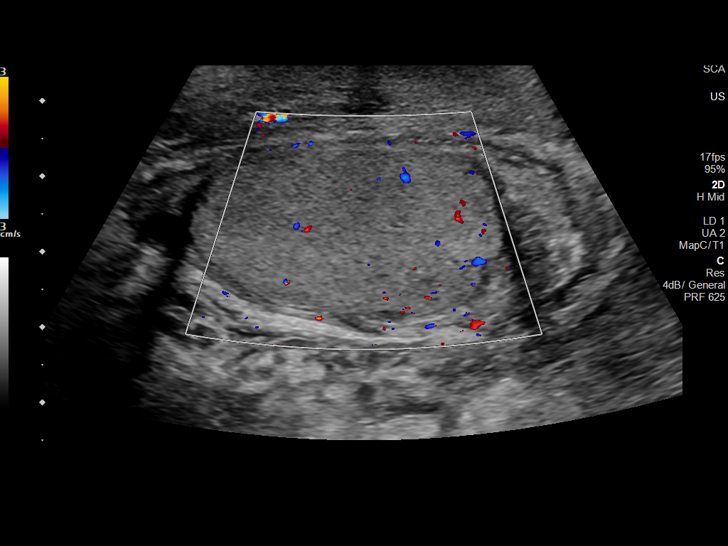
[im 7/35]
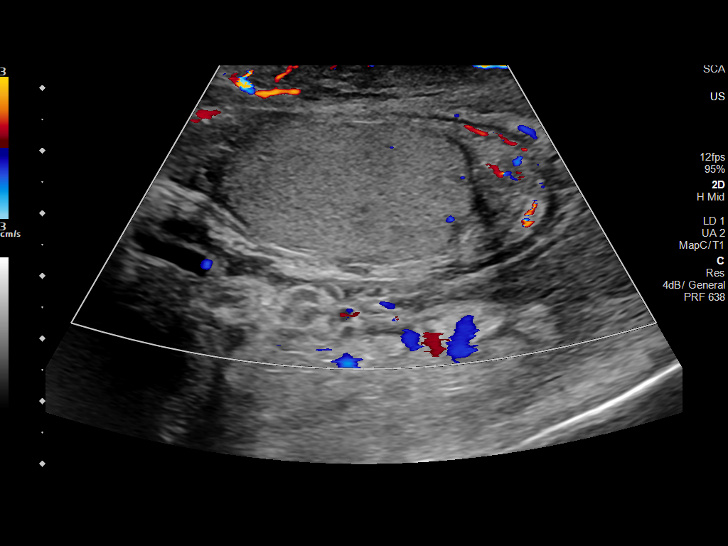
[im 11/35]
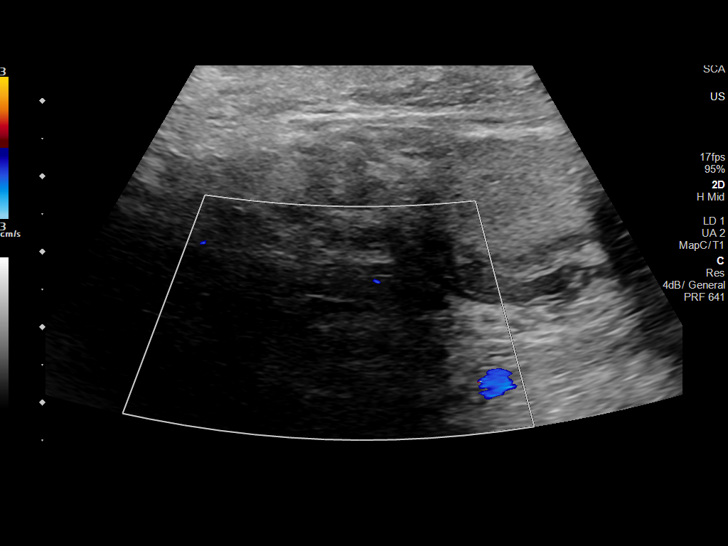
[im 14/35]
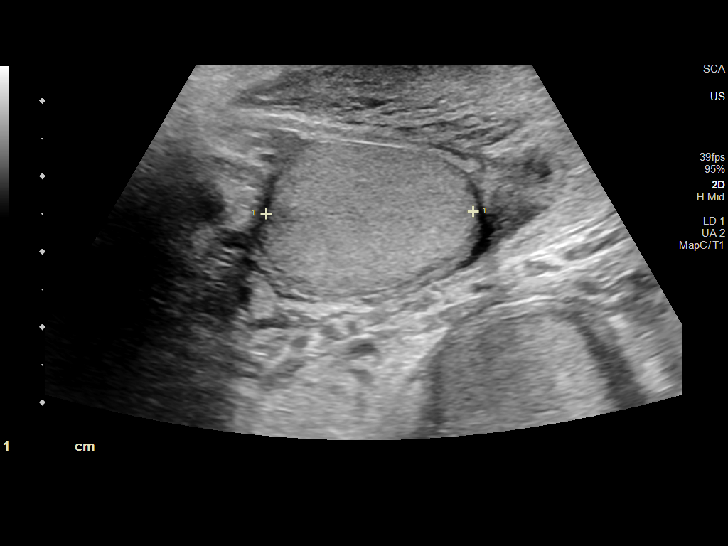
[im 18/35]
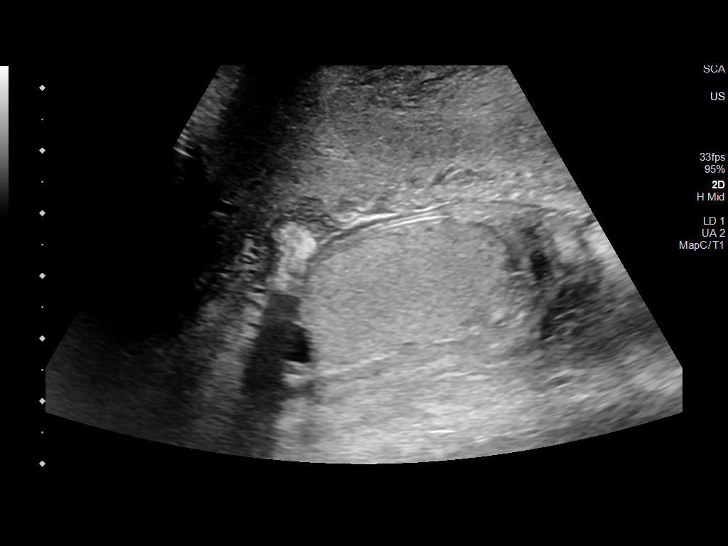
[im 21/35]
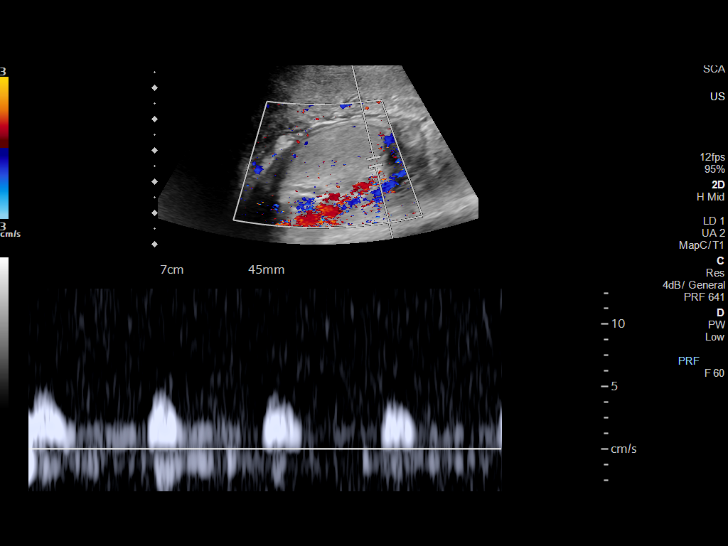
[im 24/35]
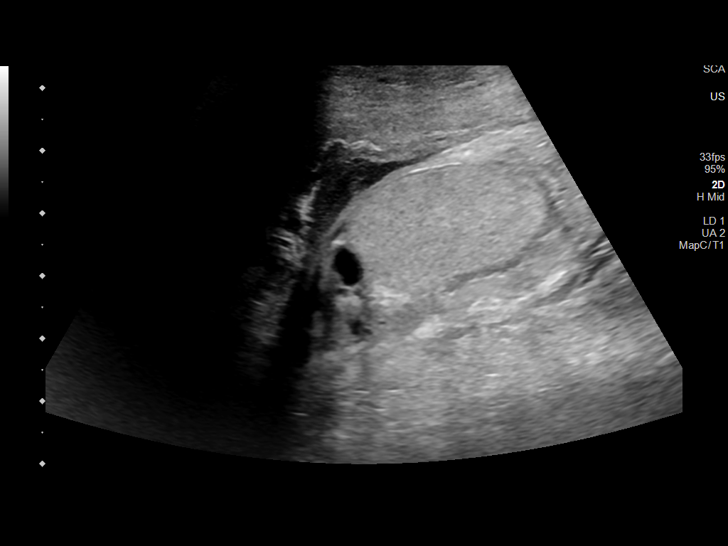
[im 28/35]
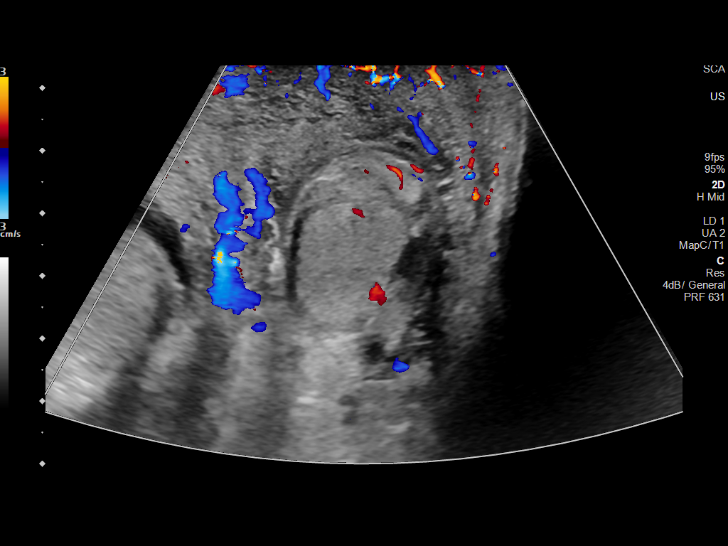
[im 31/35]
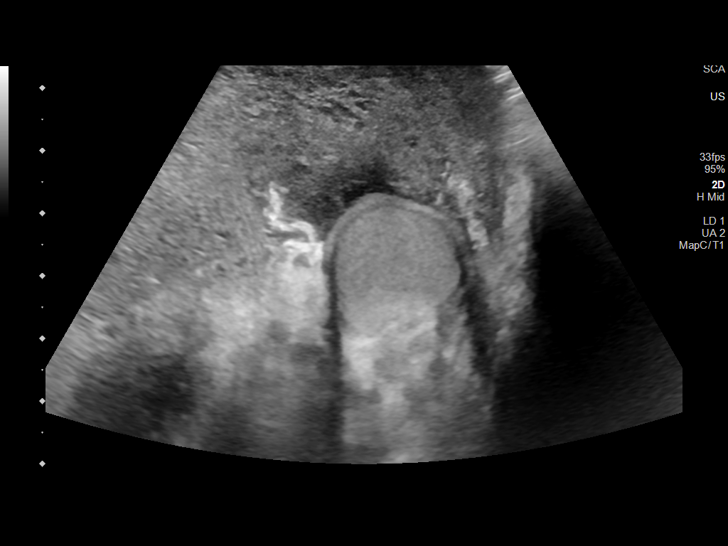
[im 35/35]
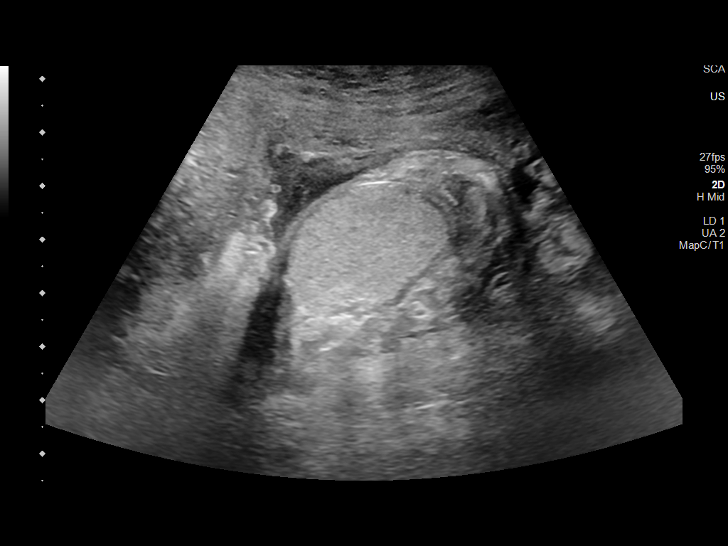

[Series 2: us scrotum w/ doppler complete · 1 of 3 slices shown (2 of 3)]
[im 3/3]
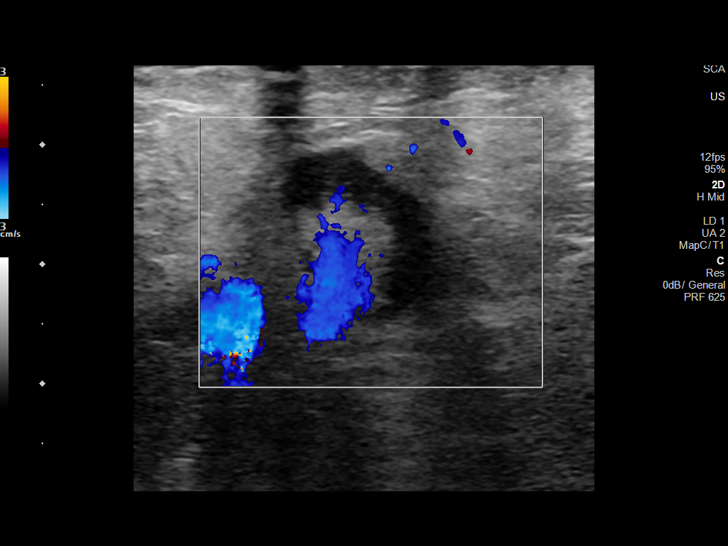

[Series 3: us scrotum w/ doppler complete · 1 of 3 slices shown (3 of 3)]
[im 3/3]
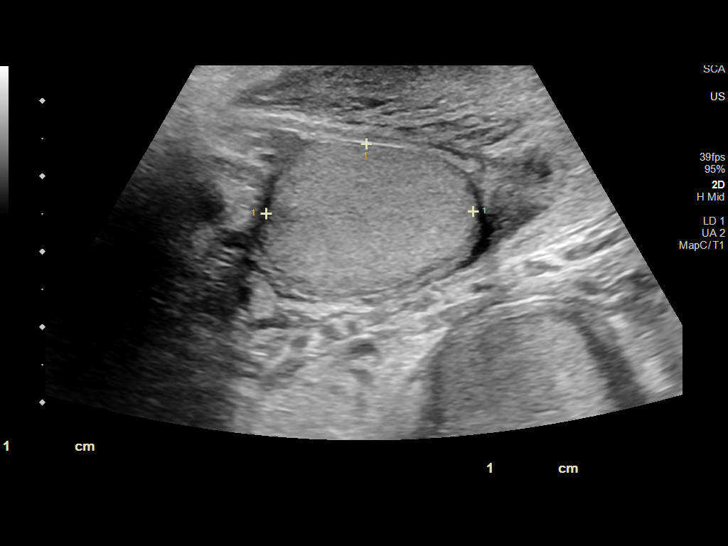

[13 of 25 positions shown; findings below may reference images not displayed]

FINDINGS: Right testicle

Measurements: 4.1 x 2.2 x 2.7 cm. No mass or microlithiasis
visualized.

Left testicle

Measurements: 3.5 x 1.9 x 2.1 cm. No mass or microlithiasis
visualized.

Right epididymis: Normal in size and appearance. Small epididymal
cysts, largest measures 5 mm.

Left epididymis:  Left epididymal head cyst measuring up to 1.0 cm.

Hydrocele:  None visualized.

Varicocele:  None visualized.

Pulsed Doppler interrogation of both testes demonstrates normal low
resistance arterial and venous waveforms bilaterally.

Other: Diffuse scrotal wall thickening. Palpable area in the right
groin is compatible with prominent lymph node with normal fatty
hilum. The lymph node measures 1.2 cm in short axis.
IMPRESSION: 1. Normal appearance of both testicles. No evidence for testicular
torsion.
2. Diffuse scrotal wall thickening.
3. Small epididymal cysts.

## 2022-03-24 ENCOUNTER — Other Ambulatory Visit: Payer: Self-pay

## 2022-03-24 ENCOUNTER — Encounter (INDEPENDENT_AMBULATORY_CARE_PROVIDER_SITE_OTHER): Payer: Self-pay | Admitting: *Deleted

## 2022-03-24 VITALS — BP 151/96 | HR 76 | Temp 98.0°F | Wt 239.4 lb

## 2022-03-24 DIAGNOSIS — Z006 Encounter for examination for normal comparison and control in clinical research program: Secondary | ICD-10-CM

## 2022-03-24 MED ORDER — STUDY - A5359 (STEP 2) - CABOTEGRAVIR 600MG/3ML INJECTION (PI-VAN DAM)
400.0000 mg | INJECTION | INTRAMUSCULAR | Status: DC
  Administered 2022-03-24: 400 mg via INTRAMUSCULAR
  Filled 2022-03-24: qty 2

## 2022-03-24 MED ORDER — STUDY - A5359 (STEP 2) - RILPIVERINE 900MG/3ML INJECTION (PI-VAN DAM)
600.0000 mg | INJECTION | INTRAMUSCULAR | Status: DC
  Administered 2022-03-24: 600 mg via INTRAMUSCULAR
  Filled 2022-03-24: qty 2

## 2022-03-24 NOTE — Research (Signed)
Eric Chapman was here for his week 36 , step 2 visit for [id number]. He denies any new problems or medications. His weight is up a little and BP continues to stay up. We discussed weight loss strategies and him needing to get in with a PCP soon. He will make an appt. For an ID provider soon. Next visit in 1 month.

## 2022-03-28 LAB — HIV-1 RNA QUANT-NO REFLEX-BLD
HIV 1 RNA Quant: 65 {copies}/mL — ABNORMAL HIGH
HIV-1 RNA Quant, Log: 1.81 {Log_copies}/mL — ABNORMAL HIGH

## 2022-04-22 ENCOUNTER — Encounter (INDEPENDENT_AMBULATORY_CARE_PROVIDER_SITE_OTHER): Payer: Self-pay | Admitting: *Deleted

## 2022-04-22 ENCOUNTER — Other Ambulatory Visit: Payer: Self-pay

## 2022-04-22 VITALS — BP 127/85 | HR 93 | Temp 98.1°F | Wt 232.1 lb

## 2022-04-22 DIAGNOSIS — Z006 Encounter for examination for normal comparison and control in clinical research program: Secondary | ICD-10-CM

## 2022-04-22 MED ORDER — STUDY - A5359 (STEP 2) - RILPIVERINE 900MG/3ML INJECTION (PI-VAN DAM)
600.0000 mg | INJECTION | Freq: Once | INTRAMUSCULAR | Status: AC
  Administered 2022-04-22: 600 mg via INTRAMUSCULAR
  Filled 2022-04-22: qty 2

## 2022-04-22 MED ORDER — STUDY - A5359 (STEP 2) - CABOTEGRAVIR 600MG/3ML INJECTION (PI-VAN DAM)
400.0000 mg | INJECTION | Freq: Once | INTRAMUSCULAR | Status: AC
  Administered 2022-04-22: 400 mg via INTRAMUSCULAR
  Filled 2022-04-22: qty 2

## 2022-04-22 NOTE — Research (Signed)
Eric Chapman was here for his week 60, step 2 visit for [id number]. He denies any problems with the last injections and received cabaneuva today without any problem. He was ill with a sinus infection this past month and was treated with augmentin and is better now. He iwll be returning in 4 weeks for the next injection.

## 2022-04-23 LAB — HEPATIC FUNCTION PANEL
AG Ratio: 1.4 (calc) (ref 1.0–2.5)
ALT: 26 U/L (ref 9–46)
AST: 21 U/L (ref 10–35)
Albumin: 4 g/dL (ref 3.6–5.1)
Alkaline phosphatase (APISO): 32 U/L — ABNORMAL LOW (ref 35–144)
Bilirubin, Direct: 0.1 mg/dL (ref 0.0–0.2)
Globulin: 2.8 g/dL (calc) (ref 1.9–3.7)
Indirect Bilirubin: 0.2 mg/dL (calc) (ref 0.2–1.2)
Total Bilirubin: 0.3 mg/dL (ref 0.2–1.2)
Total Protein: 6.8 g/dL (ref 6.1–8.1)

## 2022-05-20 ENCOUNTER — Encounter: Payer: Self-pay | Admitting: *Deleted

## 2022-05-23 ENCOUNTER — Other Ambulatory Visit: Payer: Self-pay

## 2022-05-23 ENCOUNTER — Encounter (INDEPENDENT_AMBULATORY_CARE_PROVIDER_SITE_OTHER): Payer: Self-pay | Admitting: *Deleted

## 2022-05-23 VITALS — BP 144/94 | HR 85 | Temp 97.7°F | Wt 228.0 lb

## 2022-05-23 DIAGNOSIS — Z006 Encounter for examination for normal comparison and control in clinical research program: Secondary | ICD-10-CM

## 2022-05-23 MED ORDER — STUDY - A5359 (STEP 2) - CABOTEGRAVIR 600MG/3ML INJECTION (PI-VAN DAM)
400.0000 mg | INJECTION | Freq: Once | INTRAMUSCULAR | Status: AC
  Administered 2022-05-23: 400 mg via INTRAMUSCULAR
  Filled 2022-05-23: qty 2

## 2022-05-23 MED ORDER — STUDY - A5359 (STEP 2) - RILPIVERINE 900MG/3ML INJECTION (PI-VAN DAM)
600.0000 mg | INJECTION | Freq: Once | INTRAMUSCULAR | Status: AC
  Administered 2022-05-23: 600 mg via INTRAMUSCULAR
  Filled 2022-05-23: qty 2

## 2022-05-23 NOTE — Research (Signed)
Eric Chapman was here for his monthly study visit for [phone]. He denies any problems from the last set of injections and received new ones today without any problem. His sinus infection has resolved. He will be returning in 4 weeks.

## 2022-06-17 ENCOUNTER — Other Ambulatory Visit: Payer: Self-pay

## 2022-06-17 ENCOUNTER — Encounter (INDEPENDENT_AMBULATORY_CARE_PROVIDER_SITE_OTHER): Payer: Self-pay | Admitting: *Deleted

## 2022-06-17 VITALS — BP 135/91 | HR 74 | Temp 98.7°F | Wt 229.5 lb

## 2022-06-17 DIAGNOSIS — Z006 Encounter for examination for normal comparison and control in clinical research program: Secondary | ICD-10-CM

## 2022-06-17 MED ORDER — STUDY - A5359 (STEP 2) - CABOTEGRAVIR 600MG/3ML INJECTION (PI-VAN DAM)
400.0000 mg | INJECTION | Freq: Once | INTRAMUSCULAR | Status: AC
  Administered 2022-06-17: 400 mg via INTRAMUSCULAR
  Filled 2022-06-17: qty 2

## 2022-06-17 MED ORDER — STUDY - A5359 (STEP 2) - RILPIVERINE 900MG/3ML INJECTION (PI-VAN DAM)
600.0000 mg | INJECTION | Freq: Once | INTRAMUSCULAR | Status: AC
  Administered 2022-06-17: 600 mg via INTRAMUSCULAR
  Filled 2022-06-17: qty 2

## 2022-06-17 NOTE — Research (Signed)
Liridon was here today for his week 31 visit for A5359, step 2. He said the last injections he got bothered him more than usual. He had a lot of soreness in both hips for that week after the injections. He usually gets them on Thursday or Fridays and this last time was a Monday, so he was busy at work all week and thinks the increase activity contributed to the soreness since he didn't have time to let it heal. He received the cabaneuva injections today without any problem and will return in 4 weeks.

## 2022-06-19 LAB — LIPID PANEL
Cholesterol: 144 mg/dL (ref <200)
HDL: 37 mg/dL — ABNORMAL LOW (ref >=40)
LDL Cholesterol (Calc): 92 mg/dL (calc)
Non-HDL Cholesterol (Calc): 107 mg/dL (calc) (ref <130)
Total CHOL/HDL Ratio: 3.9 (calc) (ref <5.0)
Triglycerides: 66 mg/dL (ref <150)

## 2022-06-19 LAB — COMPREHENSIVE METABOLIC PANEL
AG Ratio: 1.7 (calc) (ref 1.0–2.5)
ALT: 34 U/L (ref 9–46)
AST: 23 U/L (ref 10–35)
Albumin: 4.5 g/dL (ref 3.6–5.1)
Alkaline phosphatase (APISO): 33 U/L — ABNORMAL LOW (ref 35–144)
BUN: 10 mg/dL (ref 7–25)
CO2: 24 mmol/L (ref 20–32)
Calcium: 9.5 mg/dL (ref 8.6–10.3)
Chloride: 103 mmol/L (ref 98–110)
Creat: 1.01 mg/dL (ref 0.70–1.30)
Globulin: 2.7 g/dL (calc) (ref 1.9–3.7)
Glucose, Bld: 92 mg/dL (ref 65–99)
Potassium: 4.4 mmol/L (ref 3.5–5.3)
Sodium: 139 mmol/L (ref 135–146)
Total Bilirubin: 0.4 mg/dL (ref 0.2–1.2)
Total Protein: 7.2 g/dL (ref 6.1–8.1)

## 2022-06-19 LAB — HIV-1 RNA QUANT-NO REFLEX-BLD
HIV 1 RNA Quant: <20 Copies/mL — ABNORMAL HIGH
HIV-1 RNA Quant, Log: <1.3 Log cps/mL — ABNORMAL HIGH

## 2022-06-19 LAB — BILIRUBIN, DIRECT: Bilirubin, Direct: 0.1 mg/dL (ref 0.0–0.2)

## 2022-07-14 ENCOUNTER — Encounter (INDEPENDENT_AMBULATORY_CARE_PROVIDER_SITE_OTHER): Payer: Self-pay | Admitting: *Deleted

## 2022-07-14 ENCOUNTER — Other Ambulatory Visit: Payer: Self-pay

## 2022-07-14 VITALS — BP 148/99 | HR 97 | Temp 98.2°F | Wt 227.2 lb

## 2022-07-14 DIAGNOSIS — Z006 Encounter for examination for normal comparison and control in clinical research program: Secondary | ICD-10-CM

## 2022-07-14 MED ORDER — STUDY - A5359 (STEP 2) - CABOTEGRAVIR 600MG/3ML INJECTION (PI-VAN DAM)
400.0000 mg | INJECTION | Freq: Once | INTRAMUSCULAR | Status: AC
  Administered 2022-07-14: 400 mg via INTRAMUSCULAR
  Filled 2022-07-14: qty 2

## 2022-07-14 MED ORDER — STUDY - A5359 (STEP 2) - RILPIVERINE 900MG/3ML INJECTION (PI-VAN DAM)
600.0000 mg | INJECTION | Freq: Once | INTRAMUSCULAR | Status: DC
Start: 2022-07-14 — End: 2022-07-14

## 2022-07-14 MED ORDER — STUDY - A5359 (STEP 2) - RILPIVERINE 900MG/3ML INJECTION (PI-VAN DAM)
600.0000 mg | INJECTION | Freq: Once | INTRAMUSCULAR | Status: AC
  Administered 2022-07-14: 600 mg via INTRAMUSCULAR
  Filled 2022-07-14: qty 2

## 2022-07-14 MED ORDER — STUDY - A5359 (STEP 2) - CABOTEGRAVIR 600MG/3ML INJECTION (PI-VAN DAM)
400.0000 mg | INJECTION | Freq: Once | INTRAMUSCULAR | Status: DC
Start: 2022-07-14 — End: 2022-07-14

## 2022-07-14 NOTE — Research (Signed)
Eric Chapman was here for his week 52 visit for [id number]. He moved in to step 3 today of the study and has 52 more weeks on study. He has had an URI/sinus infection the past 2 weeks and got augmentin prescribed yesterday for it. He was tested for covid and RSV, which were negative. He received cabaneuva today without any problem and he denied any ISR from last month's injections. He will return in 4 weeks.

## 2022-07-15 ENCOUNTER — Encounter: Payer: Self-pay | Admitting: *Deleted

## 2022-07-17 LAB — HIV-1 RNA QUANT-NO REFLEX-BLD
HIV 1 RNA Quant: 22 Copies/mL — ABNORMAL HIGH
HIV-1 RNA Quant, Log: 1.35 Log cps/mL — ABNORMAL HIGH

## 2022-08-12 ENCOUNTER — Encounter (INDEPENDENT_AMBULATORY_CARE_PROVIDER_SITE_OTHER): Payer: Self-pay | Admitting: *Deleted

## 2022-08-12 ENCOUNTER — Other Ambulatory Visit: Payer: Self-pay

## 2022-08-12 VITALS — BP 132/90 | HR 84 | Temp 97.9°F | Wt 224.2 lb

## 2022-08-12 DIAGNOSIS — Z006 Encounter for examination for normal comparison and control in clinical research program: Secondary | ICD-10-CM

## 2022-08-12 LAB — CBC WITH DIFFERENTIAL/PLATELET
MCHC: 33.9 g/dL (ref 32.0–36.0)
Monocytes Relative: 8.7 %
Neutro Abs: 7210 cells/uL (ref 1500–7800)

## 2022-08-12 MED ORDER — STUDY - A5359 (STEP 2) - RILPIVERINE 900MG/3ML INJECTION (PI-VAN DAM)
600.0000 mg | INJECTION | Freq: Once | INTRAMUSCULAR | Status: AC
  Administered 2022-08-12: 600 mg via INTRAMUSCULAR
  Filled 2022-08-12: qty 2

## 2022-08-12 MED ORDER — STUDY - A5359 (STEP 2) - CABOTEGRAVIR 600MG/3ML INJECTION (PI-VAN DAM)
400.0000 mg | INJECTION | Freq: Once | INTRAMUSCULAR | Status: AC
  Administered 2022-08-12: 400 mg via INTRAMUSCULAR
  Filled 2022-08-12: qty 2

## 2022-08-12 NOTE — Research (Signed)
Eric Chapman was here for his week 4 visit for [id number]. He is now in step 3 of the study and will come off study at week 52 and transition to getting cabaneuva in the clinic. He denies any new problems and received the injections without any problem. He will return in 4 weeks.

## 2022-08-13 LAB — CBC WITH DIFFERENTIAL/PLATELET
Absolute Monocytes: 870 cells/uL (ref 200–950)
Basophils Absolute: 40 cells/uL (ref 0–200)
Basophils Relative: 0.4 %
Eosinophils Absolute: 240 cells/uL (ref 15–500)
Eosinophils Relative: 2.4 %
HCT: 49 % (ref 38.5–50.0)
Hemoglobin: 16.6 g/dL (ref 13.2–17.1)
Lymphs Abs: 1640 cells/uL (ref 850–3900)
MCH: 28.1 pg (ref 27.0–33.0)
MCV: 82.9 fL (ref 80.0–100.0)
MPV: 9.7 fL (ref 7.5–12.5)
Neutrophils Relative %: 72.1 %
Platelets: 301 10*3/uL (ref 140–400)
RBC: 5.91 10*6/uL — ABNORMAL HIGH (ref 4.20–5.80)
RDW: 12.8 % (ref 11.0–15.0)
Total Lymphocyte: 16.4 %
WBC: 10 10*3/uL (ref 3.8–10.8)

## 2022-08-13 LAB — COMPREHENSIVE METABOLIC PANEL
AG Ratio: 1.7 (calc) (ref 1.0–2.5)
ALT: 32 U/L (ref 9–46)
AST: 27 U/L (ref 10–35)
Albumin: 4.1 g/dL (ref 3.6–5.1)
Alkaline phosphatase (APISO): 38 U/L (ref 35–144)
BUN: 11 mg/dL (ref 7–25)
CO2: 24 mmol/L (ref 20–32)
Calcium: 9.1 mg/dL (ref 8.6–10.3)
Chloride: 102 mmol/L (ref 98–110)
Creat: 1.11 mg/dL (ref 0.70–1.30)
Globulin: 2.4 g/dL (calc) (ref 1.9–3.7)
Glucose, Bld: 105 mg/dL — ABNORMAL HIGH (ref 65–99)
Potassium: 4 mmol/L (ref 3.5–5.3)
Sodium: 136 mmol/L (ref 135–146)
Total Bilirubin: 0.7 mg/dL (ref 0.2–1.2)
Total Protein: 6.5 g/dL (ref 6.1–8.1)

## 2022-08-13 LAB — BILIRUBIN, DIRECT: Bilirubin, Direct: 0.1 mg/dL (ref 0.0–0.2)

## 2022-09-09 ENCOUNTER — Other Ambulatory Visit: Payer: Self-pay

## 2022-09-09 ENCOUNTER — Encounter (INDEPENDENT_AMBULATORY_CARE_PROVIDER_SITE_OTHER): Payer: Self-pay | Admitting: *Deleted

## 2022-09-09 VITALS — BP 135/88 | HR 82 | Temp 98.1°F | Wt 226.6 lb

## 2022-09-09 DIAGNOSIS — Z006 Encounter for examination for normal comparison and control in clinical research program: Secondary | ICD-10-CM

## 2022-09-09 MED ORDER — STUDY - A5359 (STEP 2) - RILPIVERINE 900MG/3ML INJECTION (PI-VAN DAM)
600.0000 mg | INJECTION | INTRAMUSCULAR | Status: DC
  Filled 2022-09-09 (×2): qty 2

## 2022-09-09 MED ORDER — STUDY - A5359 (STEP 2) - CABOTEGRAVIR 600MG/3ML INJECTION (PI-VAN DAM)
400.0000 mg | INJECTION | INTRAMUSCULAR | Status: DC
  Filled 2022-09-09 (×2): qty 2

## 2022-09-09 NOTE — Research (Signed)
Eric Chapman was here for his week 8 step 3 visit for [id number]. He denies any new problems or Injection site issues from the last visit. He was given injections of cabaneuva today without any problem and will return in 4 weeks.

## 2022-09-10 LAB — HEPATIC FUNCTION PANEL
AG Ratio: 1.5 (calc) (ref 1.0–2.5)
AST: 24 U/L (ref 10–35)
Alkaline phosphatase (APISO): 36 U/L (ref 35–144)
Total Bilirubin: 0.5 mg/dL (ref 0.2–1.2)

## 2022-09-11 LAB — HIV-1 RNA QUANT-NO REFLEX-BLD
HIV 1 RNA Quant: <20 Copies/mL — ABNORMAL HIGH
HIV-1 RNA Quant, Log: <1.3 Log cps/mL — ABNORMAL HIGH

## 2022-09-11 LAB — HEPATIC FUNCTION PANEL
ALT: 30 U/L (ref 9–46)
Albumin: 4.1 g/dL (ref 3.6–5.1)
Bilirubin, Direct: 0.1 mg/dL (ref 0.0–0.2)
Globulin: 2.8 g/dL (calc) (ref 1.9–3.7)
Indirect Bilirubin: 0.4 mg/dL (calc) (ref 0.2–1.2)
Total Protein: 6.9 g/dL (ref 6.1–8.1)

## 2022-10-07 ENCOUNTER — Other Ambulatory Visit: Payer: Self-pay | Admitting: *Deleted

## 2022-10-07 ENCOUNTER — Other Ambulatory Visit: Payer: Self-pay

## 2022-10-07 ENCOUNTER — Encounter (INDEPENDENT_AMBULATORY_CARE_PROVIDER_SITE_OTHER): Payer: Self-pay | Admitting: *Deleted

## 2022-10-07 DIAGNOSIS — Z006 Encounter for examination for normal comparison and control in clinical research program: Secondary | ICD-10-CM

## 2022-10-07 MED ORDER — STUDY - A5359 (STEP 2) - CABOTEGRAVIR 600MG/3ML INJECTION (PI-VAN DAM)
400.0000 mg | INJECTION | Freq: Once | INTRAMUSCULAR | Status: AC
  Administered 2022-10-07: 400 mg via INTRAMUSCULAR
  Filled 2022-10-07: qty 2

## 2022-10-07 MED ORDER — STUDY - A5359 (STEP 2) - RILPIVERINE 900MG/3ML INJECTION (PI-VAN DAM)
600.0000 mg | INJECTION | Freq: Once | INTRAMUSCULAR | Status: AC
  Administered 2022-10-07: 600 mg via INTRAMUSCULAR
  Filled 2022-10-07: qty 2

## 2022-10-10 NOTE — Research (Signed)
Eric Chapman was here for his monthly visit for [phone]. He denied any side effects from his last injections and was given new injections today with out  any problem. He did say he had an episode Tuesday, the 9th where he got overheated at work , was vomiting and took a few days off to recuperate. 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 He will be returning in 1 month for the next study visit.

## 2022-11-04 ENCOUNTER — Other Ambulatory Visit: Payer: Self-pay | Admitting: *Deleted

## 2022-11-04 ENCOUNTER — Other Ambulatory Visit: Payer: Self-pay

## 2022-11-04 ENCOUNTER — Encounter (INDEPENDENT_AMBULATORY_CARE_PROVIDER_SITE_OTHER): Payer: Self-pay | Admitting: *Deleted

## 2022-11-04 VITALS — BP 137/87 | HR 94 | Temp 98.0°F | Wt 226.2 lb

## 2022-11-04 DIAGNOSIS — Z006 Encounter for examination for normal comparison and control in clinical research program: Secondary | ICD-10-CM

## 2022-11-04 MED ORDER — STUDY - A5359 (STEP 2) - CABOTEGRAVIR 600MG/3ML INJECTION (PI-VAN DAM)
400.0000 mg | INJECTION | INTRAMUSCULAR | Status: DC
  Administered 2022-11-04: 400 mg via INTRAMUSCULAR
  Filled 2022-11-04 (×2): qty 2

## 2022-11-04 MED ORDER — STUDY - A5359 (STEP 2) - RILPIVERINE 900MG/3ML INJECTION (PI-VAN DAM)
600.0000 mg | INJECTION | INTRAMUSCULAR | Status: DC
  Administered 2022-11-04: 600 mg via INTRAMUSCULAR
  Filled 2022-11-04 (×2): qty 2

## 2022-11-04 NOTE — Research (Signed)
Eric Chapman was here for his week 16, step 3 visit for [id number]. He denies any problems with his last set of injections a month ago or any other new medical problems or medications. He was given cabaneuva today and will be returning in 4 weeks for the next study visit.

## 2022-12-01 ENCOUNTER — Other Ambulatory Visit: Payer: Self-pay

## 2022-12-01 ENCOUNTER — Other Ambulatory Visit: Payer: Self-pay | Admitting: *Deleted

## 2022-12-01 ENCOUNTER — Encounter (INDEPENDENT_AMBULATORY_CARE_PROVIDER_SITE_OTHER): Payer: Self-pay | Admitting: *Deleted

## 2022-12-01 VITALS — BP 138/83 | HR 81 | Temp 97.7°F | Wt 224.5 lb

## 2022-12-01 DIAGNOSIS — Z006 Encounter for examination for normal comparison and control in clinical research program: Secondary | ICD-10-CM

## 2022-12-01 MED ORDER — STUDY - A5359 (STEP 2) - CABOTEGRAVIR 600MG/3ML INJECTION (PI-VAN DAM)
400.0000 mg | INJECTION | INTRAMUSCULAR | Status: DC
  Administered 2022-12-01: 400 mg via INTRAMUSCULAR
  Filled 2022-12-01: qty 2

## 2022-12-01 MED ORDER — STUDY - A5359 (STEP 2) - RILPIVERINE 900MG/3ML INJECTION (PI-VAN DAM)
600.0000 mg | INJECTION | INTRAMUSCULAR | Status: DC
  Administered 2022-12-01: 600 mg via INTRAMUSCULAR
  Filled 2022-12-01: qty 2

## 2022-12-01 NOTE — Research (Signed)
Eric Chapman seen today for week 20 for A5359, A Phase III Study to Evaluate Long Acting Antiretroviral Therapy in Non-adherent HIV-Infected Individuals. Denied any injection site reaction after his last injections. All study procedures completed and study IP administered per protocol. He will return in October for his next study visit and to see Dr. Renold Don.

## 2022-12-02 ENCOUNTER — Encounter: Payer: Self-pay | Admitting: *Deleted

## 2022-12-30 ENCOUNTER — Other Ambulatory Visit: Payer: Self-pay

## 2022-12-30 VITALS — BP 147/93 | HR 80 | Temp 98.6°F | Resp 16 | Wt 226.9 lb

## 2022-12-30 DIAGNOSIS — Z006 Encounter for examination for normal comparison and control in clinical research program: Secondary | ICD-10-CM

## 2022-12-30 MED ORDER — STUDY - A5359 (STEP 2) - RILPIVERINE 900MG/3ML INJECTION (PI-VAN DAM)
600.0000 mg | INJECTION | INTRAMUSCULAR | Status: DC
  Administered 2022-12-30: 600 mg via INTRAMUSCULAR
  Filled 2022-12-30: qty 2

## 2022-12-30 MED ORDER — STUDY - A5359 (STEP 2) - CABOTEGRAVIR 600MG/3ML INJECTION (PI-VAN DAM)
400.0000 mg | INJECTION | INTRAMUSCULAR | Status: DC
  Administered 2022-12-30: 400 mg via INTRAMUSCULAR
  Filled 2022-12-30: qty 2

## 2022-12-30 NOTE — Research (Signed)
Individual seen for research visit week 24 for A5359: A Phase III Study to Evaluate Long Acting Antiretroviral Therapy in Non-adherent HIV-Infected Individuals. For patient care concerns please call [phone]. Overall individual is doing well. All procedures carried out per protocol. Study medications administered, tolerated well. Plan to see participant again in 1 month.

## 2023-01-01 LAB — COMPREHENSIVE METABOLIC PANEL
AG Ratio: 1.3 (calc) (ref 1.0–2.5)
ALT: 28 U/L (ref 9–46)
AST: 17 U/L (ref 10–35)
Albumin: 4.1 g/dL (ref 3.6–5.1)
Alkaline phosphatase (APISO): 40 U/L (ref 35–144)
BUN: 10 mg/dL (ref 7–25)
CO2: 23 mmol/L (ref 20–32)
Calcium: 9.6 mg/dL (ref 8.6–10.3)
Chloride: 103 mmol/L (ref 98–110)
Creat: 0.9 mg/dL (ref 0.70–1.30)
Globulin: 3.1 g/dL (ref 1.9–3.7)
Glucose, Bld: 130 mg/dL — ABNORMAL HIGH (ref 65–99)
Potassium: 3.9 mmol/L (ref 3.5–5.3)
Sodium: 137 mmol/L (ref 135–146)
Total Bilirubin: 0.4 mg/dL (ref 0.2–1.2)
Total Protein: 7.2 g/dL (ref 6.1–8.1)

## 2023-01-01 LAB — BILIRUBIN, DIRECT: Bilirubin, Direct: 0.1 mg/dL (ref 0.0–0.2)

## 2023-01-01 LAB — HIV-1 RNA QUANT-NO REFLEX-BLD
HIV 1 RNA Quant: 231 {copies}/mL — ABNORMAL HIGH
HIV-1 RNA Quant, Log: 2.36 {Log} — ABNORMAL HIGH

## 2023-01-02 ENCOUNTER — Telehealth: Payer: Self-pay

## 2023-01-02 NOTE — Telephone Encounter (Signed)
Attempted to call patient regarding lab results. Not able to reach him at this time. Left voicemail requesting call back. Ivie Savitt M Deola Rewis, RMA  

## 2023-01-02 NOTE — Telephone Encounter (Signed)
-----   Message from [place] sent at 01/02/2023  3:02 PM EDT ----- Regarding: low grade viremia. is he missing doses?  ----- Message ----- From: Interface, [facility] Lab Results In Sent: 12/31/2022  12:38 AM EDT To: Randall Hiss, MD

## 2023-01-13 ENCOUNTER — Other Ambulatory Visit: Payer: Self-pay

## 2023-01-13 ENCOUNTER — Encounter (INDEPENDENT_AMBULATORY_CARE_PROVIDER_SITE_OTHER): Payer: Self-pay

## 2023-01-13 DIAGNOSIS — Z006 Encounter for examination for normal comparison and control in clinical research program: Secondary | ICD-10-CM

## 2023-01-13 NOTE — Research (Signed)
Individual seen for research visit for study A5359 to follow up lab results. Overall individual is doing well. All procedures carried out per protocol.  Plan to see participant again in 2 weeks.

## 2023-01-24 ENCOUNTER — Telehealth: Payer: Self-pay

## 2023-01-24 ENCOUNTER — Other Ambulatory Visit: Payer: Self-pay

## 2023-01-24 ENCOUNTER — Encounter: Payer: Self-pay | Admitting: Internal Medicine

## 2023-01-24 ENCOUNTER — Ambulatory Visit (INDEPENDENT_AMBULATORY_CARE_PROVIDER_SITE_OTHER): Payer: Commercial Managed Care - PPO | Admitting: Internal Medicine

## 2023-01-24 ENCOUNTER — Other Ambulatory Visit (HOSPITAL_COMMUNITY)
Admission: RE | Admit: 2023-01-24 | Discharge: 2023-01-24 | Disposition: A | Discharge status: Home / Self Care | Payer: Commercial Managed Care - PPO | Source: Ambulatory Visit | Attending: Internal Medicine | Admitting: Internal Medicine

## 2023-01-24 VITALS — BP 129/80 | HR 92 | Temp 99.4°F | Wt 232.0 lb

## 2023-01-24 DIAGNOSIS — Z1159 Encounter for screening for other viral diseases: Secondary | ICD-10-CM | POA: Diagnosis not present

## 2023-01-24 DIAGNOSIS — Z113 Encounter for screening for infections with a predominantly sexual mode of transmission: Secondary | ICD-10-CM | POA: Insufficient documentation

## 2023-01-24 DIAGNOSIS — E291 Testicular hypofunction: Secondary | ICD-10-CM

## 2023-01-24 DIAGNOSIS — B20 Human immunodeficiency virus [HIV] disease: Secondary | ICD-10-CM

## 2023-01-24 NOTE — Progress Notes (Addendum)
[facility]  Patient Active Problem List   Diagnosis Date Noted   Irritant contact dermatitis 02/09/2021   Current mild episode of major depressive disorder without prior episode (HCC) 04/09/2020   Sepsis (HCC) 03/15/2014   SIRS (systemic inflammatory response syndrome) (HCC) 03/15/2014   Pyrexia    HIV (human immunodeficiency virus infection) (HCC)    Abdominal pain, acute    Nausea vomiting and diarrhea 03/14/2014   History of kidney stones 08/12/2011   Abdominal pain, R sided periumbilical 05/25/2011   Human immunodeficiency virus (HIV) disease (HCC) 02/10/2006   Regional enteritis of small bowel (HCC) 02/10/2006   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   MRN: [id number]  Cc: hiv care  HPI:  Eric Lineweaver. is a 57 y.o. 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 Last seen dr Drue Second 03/2020. Previously known frequent missing of medications. 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 No smoking/drinking/drugs. Not in a current relationship. 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 He is still looking for a PCP.  He just enrolled in [facility] (consent signed 01/2021). He remains on prezista and genvoya. Missing doses still. He doesn't really bring up any reason for missing dose except "having a routine."  He owns his own house No f c n v diarrhea rash   01/24/23 id clinic f/u See a&P for detail A little run down not sure he caught something or over exert himself at work  Allergies  Allergen Reactions   Oxycodone Hcl Other (See Comments)   Percocet [Oxycodone-Acetaminophen] Itching and Other (See Comments)    Extreme agitation      Outpatient Medications Prior to Visit  Medication Sig Dispense Refill   Study - A5359 (Step 2) - cabotegravir 600 mg/3 mL intramuscular injection ([place]) Inject 2 mLs (400 mg total) into the muscle every 28 (twenty-eight) days. For Investigational Use Only. Administer injection in gluteal muscle every 4 weeks. 1 mL 0   Study - A5359 (Step 2) - rilpiverine 900 mg/3 mL intramuscular injection ([place]) Inject 2 mLs (600 mg total) into the muscle every 28 (twenty-eight) days. For Investigational Use Only. Administer injection in gluteal muscle every 4 weeks. 1 mL 0   testosterone cypionate (DEPO-TESTOSTERONE) 200 MG/ML injection Inject 0.5 mLs (100 mg total) into the muscle every  7 (seven) days. 10 mL 0   testosterone cypionate (DEPOTESTOSTERONE CYPIONATE) 200 MG/ML injection Inject 0.5 MLs into the muscle once every 7 days (Patient not taking: Reported on 01/24/2023) 10 mL 0   TUBERCULIN SYR 1CC/25GX5/8" 25G X 5/8" 1 ML MISC  (Patient not taking: Reported on 01/24/2023)     Facility-Administered Medications Prior to Visit  Medication Dose Route Frequency Provider Last Rate Last Admin   Study - A5359 (Step 2) - cabotegravir 600  mg/3 mL intramuscular injection ([place])  400 mg Intramuscular Q28 days    400 mg at 12/30/22 1220   Study - A5359 (Step 2) - rilpiverine 900 mg/3 mL intramuscular injection ([place])  600 mg Intramuscular Q28 days    600 mg at 12/30/22 1222     Social History   Socioeconomic History   Marital status: Single    Spouse name: Not on file   Number of children: Not on file   Years of education: Not on file   Highest education level: Not on file  Occupational History   Not on file  Tobacco Use   Smoking status: Never   Smokeless tobacco: Never  Vaping Use   Vaping status: Never Used  Substance and Sexual Activity   Alcohol use: Yes    Alcohol/week: 0.0 standard drinks of alcohol    Comment: occ   Drug use: No   Sexual activity: Yes    Partners: Male    Birth control/protection: Condom    Comment: decined condoms  Other Topics Concern   Not on file  Social History Narrative   Single,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   Social Determinants of Health   Financial Resource Strain: Not on file  Food Insecurity: Not on file  Transportation Needs: Not on file  Physical Activity: Not on file  Stress: Not on file  Social Connections: Not on file  Intimate Partner Violence: Not on file      Review of Systems    All other ros negative  Objective:    Wt 232 lb (105.2 kg)   BMI 33.29 kg/m  Nursing note and vital signs reviewed.  Physical Exam     General/constitutional: no distress, pleasant HEENT: Normocephalic, PER, Conj Clear, EOMI, Oropharynx clear Neck supple CV: rrr no mrg Lungs: clear to auscultation, normal respiratory effort Abd: Soft, Nontender Ext: no edema Skin: No Rash Neuro: nonfocal MSK: no peripheral joint swelling/tenderness/warmth; back spines nontender       Labs: Lab Results  Component Value Date   WBC 10.0 08/12/2022   HGB 16.6 08/12/2022   HCT 49.0 08/12/2022   MCV 82.9 08/12/2022   PLT 301  08/12/2022   Last metabolic panel Lab Results  Component Value Date   GLUCOSE 130 (H) 12/30/2022   NA 137 12/30/2022   K 3.9 12/30/2022   CL 103 12/30/2022   CO2 23 12/30/2022   BUN 10 12/30/2022   CREATININE 0.90 12/30/2022   GFRNONAA >60 05/25/2021   GFRAA 113 09/02/2020   CALCIUM 9.6 12/30/2022   PROT 7.2 12/30/2022   ALBUMIN 3.2 (L) 05/25/2021   BILITOT 0.4 12/30/2022   ALKPHOS 32 (L) 05/25/2021   AST 17 12/30/2022   ALT 28 12/30/2022   ANIONGAP 6 05/25/2021    Hiv: 10/24             399        / 12/03/20        98k 04/09/20  99           /          526 (24%) 06/2019         64k         /          313 (18%) 01/2019         28           /          483 (24%) 03/2018           01/2017         UD  Micro:  Serology: 11/2020 rpr 1:1 11/2020 hep b cAb reactive, sag negative; hep a igm negative; hep c ab negative 11/2020 quant gold negative 11/2020 urine and oropharyngeal gc/chlam negative   03/2020 rpr 1:1  Imaging:  Assessment & Plan:   Problem List Items Addressed This Visit   None Visit Diagnoses     HIV disease (HCC)    -  Primary   Androgen deficiency       Need for hepatitis B screening test       Relevant Orders   Hepatitis B surface antibody,quantitative   Screening for STDs (sexually transmitted diseases)       Relevant Orders   Cytology (oral, anal, urethral) ancillary only   Cytology (oral, anal, urethral) ancillary only   T.pallidum Ab, Total   RPR   Urine cytology ancillary only([place])        #hiv: Noncompliant history Currently since 11/2020, at least, remains on genvoya and prezista Need repeat labs today as previously was off therapy when saw me in 11/2020  Patient enrolled in [facility] as of 01/2021 Still missing doses Talked about getting in a "routine"  12/30/22 started cabenuva end of 2023; he is in the study A5359   Lab Results  Component Value Date   HIV1RNAQUANT 231 (H) 12/30/2022   Lab Results   Component Value Date   CD4TCELL 24 (L) 04/09/2020   CD4TABS 526 04/09/2020    -------- Post visit addendum; I spoke with the research team and we'll get the archive genosure to see if he had developed resistance to the cabenuva  Will advise him to come get it. 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 He doesn't have a pcp and asked to be referred to one  -referral to internal medicine teaching service for pcp care -01/24/23 stated that testosterone is too expensive; he is ok with refill     #hcm: -vaccination Prevnar 2022 Tdap 2014 Meningococcal will reviewed -hepatitis Prior hep b infection -- 12/2020 hep b dna negative; hep b sAb 118 -std 11/2020 rpr stable titer 1:1 11/2020 oral/urine gc/chlam negative 12/2022 triple screen and tppa -annual tb screen 11/2020 quant gold negative -Cancer screening Will need colon cancer fit test --  ordered 10/26 Will discuss anal cancer screening     #fatigue Some muscle ache Nasal congestion noted during visit Suspect viral syndrome  Encourage good hydration F/u as needed   Follow-up: Return in about 6 months (around 07/25/2023).      Raymondo Band, MD [facility] [phone]  pager   [phone] cell 01/24/2023, 9:56 AM

## 2023-01-24 NOTE — Telephone Encounter (Signed)
Left voicemail asking patient to return my call.   Billey Wojciak P Farrin Shadle, CMA  

## 2023-01-24 NOTE — Telephone Encounter (Signed)
-----   Message from Raymondo Band sent at 01/24/2023  2:40 PM EDT ----- Hi team  This guy's viral load rises on cabenuva. He is in a study. I spoke with research team and they want [place] to order archive (I just did but after visit). Could you please let him know to come this week and get the archive testing done. He does have research f/u this Friday too so maybe he can come in before earlier but may be better to do tomorrow or Thursday. Thanks

## 2023-01-24 NOTE — Patient Instructions (Addendum)
[facility] Address: [address] [place] [address]; Ground Floor - [facility], [place], [place] [address] Hours:  Open ? Closes 5?PM Phone: [phone]    Please call above and establish primary care    Let us know if you need quick refill of certain prescriptions while waiting to get in with pcp    See [place] in 6 months; we'll need to do anal pap then   Research team should be talking to you about the elevated viral load   Urine/blood test today for std screening

## 2023-01-24 NOTE — Addendum Note (Signed)
Addended by: Harley Alto on: 01/24/2023 10:33 AM   Modules accepted: Orders

## 2023-01-24 NOTE — Addendum Note (Signed)
Addended by: Rutha Bouchard T on: 01/24/2023 02:40 PM   Modules accepted: Orders

## 2023-01-25 LAB — URINE CYTOLOGY ANCILLARY ONLY
Chlamydia: NEGATIVE
Comment: NEGATIVE
Comment: NEGATIVE
Comment: NORMAL
Neisseria Gonorrhea: NEGATIVE
Trichomonas: NEGATIVE

## 2023-01-25 LAB — CYTOLOGY, (ORAL, ANAL, URETHRAL) ANCILLARY ONLY
Chlamydia: NEGATIVE
Chlamydia: NEGATIVE
Comment: NEGATIVE
Comment: NEGATIVE
Comment: NORMAL
Comment: NORMAL
Neisseria Gonorrhea: NEGATIVE
Neisseria Gonorrhea: NEGATIVE

## 2023-01-25 LAB — HEPATITIS B SURFACE ANTIBODY, QUANTITATIVE: Hep B S AB Quant (Post): 366 m[IU]/mL (ref >=10)

## 2023-01-25 NOTE — Telephone Encounter (Signed)
Patient informed and verbalized understanding.  Patient will come on 01/27/23 for labs

## 2023-01-27 ENCOUNTER — Other Ambulatory Visit: Payer: Commercial Managed Care - PPO

## 2023-01-27 ENCOUNTER — Other Ambulatory Visit: Payer: Self-pay

## 2023-01-27 ENCOUNTER — Encounter (INDEPENDENT_AMBULATORY_CARE_PROVIDER_SITE_OTHER): Payer: Self-pay

## 2023-01-27 ENCOUNTER — Other Ambulatory Visit (HOSPITAL_COMMUNITY): Payer: Self-pay

## 2023-01-27 DIAGNOSIS — Z006 Encounter for examination for normal comparison and control in clinical research program: Secondary | ICD-10-CM

## 2023-01-27 NOTE — Research (Unsigned)
Individual seen for research visit Step 3 PTDC/ Step 4 Registration for A5359. All procedures carried out per protocol. Symtuza sample provided pending insurance eligibility. Plan to see participant again on Monday 04Nov2024 for Lab draw.

## 2023-01-30 ENCOUNTER — Encounter (INDEPENDENT_AMBULATORY_CARE_PROVIDER_SITE_OTHER): Payer: Self-pay

## 2023-01-30 ENCOUNTER — Other Ambulatory Visit (HOSPITAL_COMMUNITY): Payer: Self-pay

## 2023-01-30 ENCOUNTER — Telehealth: Payer: Self-pay

## 2023-01-30 ENCOUNTER — Ambulatory Visit: Payer: Commercial Managed Care - PPO

## 2023-01-30 ENCOUNTER — Other Ambulatory Visit: Payer: Self-pay

## 2023-01-30 DIAGNOSIS — Z006 Encounter for examination for normal comparison and control in clinical research program: Secondary | ICD-10-CM

## 2023-01-30 LAB — COMPREHENSIVE METABOLIC PANEL
AG Ratio: 1.4 (calc) (ref 1.0–2.5)
ALT: 21 U/L (ref 9–46)
AST: 13 U/L (ref 10–35)
Albumin: 4.1 g/dL (ref 3.6–5.1)
Alkaline phosphatase (APISO): 49 U/L (ref 35–144)
BUN: 9 mg/dL (ref 7–25)
CO2: 29 mmol/L (ref 20–32)
Calcium: 9.6 mg/dL (ref 8.6–10.3)
Chloride: 100 mmol/L (ref 98–110)
Creat: 1.09 mg/dL (ref 0.70–1.30)
Globulin: 2.9 g/dL (ref 1.9–3.7)
Glucose, Bld: 76 mg/dL (ref 65–99)
Potassium: 3.7 mmol/L (ref 3.5–5.3)
Sodium: 138 mmol/L (ref 135–146)
Total Bilirubin: 0.7 mg/dL (ref 0.2–1.2)
Total Protein: 7 g/dL (ref 6.1–8.1)

## 2023-01-30 LAB — HIV-1 RNA QUANT-NO REFLEX-BLD
HIV 1 RNA Quant: 64 {copies}/mL — ABNORMAL HIGH
HIV-1 RNA Quant, Log: 1.8 {Log_copies}/mL — ABNORMAL HIGH

## 2023-01-30 LAB — BILIRUBIN, DIRECT: Bilirubin, Direct: 0.2 mg/dL (ref 0.0–0.2)

## 2023-01-30 NOTE — Telephone Encounter (Signed)
[facility] Patient Advocate Encounter  Patient insurance do not pay for any specialty medication.  I contacted Demetrio Lapping to start a PA so they can deny the claim and then they will be able to give me a MNR so I can then apply for [facility] Patient Assistance.   Patient is aware , patient will send me a copy of his W2 so I can apply once I get the denial.  Clearance Coots, CPhT Specialty Pharmacy Patient [facility] Phone: [phone] Fax:  [phone]

## 2023-01-31 LAB — LIPID PANEL
Cholesterol: 131 mg/dL (ref <200)
HDL: 33 mg/dL — ABNORMAL LOW (ref >=40)
LDL Cholesterol (Calc): 78 mg/dL
Non-HDL Cholesterol (Calc): 98 mg/dL (ref <130)
Total CHOL/HDL Ratio: 4 (calc) (ref <5.0)
Triglycerides: 117 mg/dL (ref <150)

## 2023-01-31 NOTE — Research (Signed)
Seen today for Research Appt for study [id number]. Lab Draw only. Plan to see again in 6 months.

## 2023-02-09 ENCOUNTER — Other Ambulatory Visit (HOSPITAL_COMMUNITY): Payer: Self-pay

## 2023-02-13 ENCOUNTER — Other Ambulatory Visit (HOSPITAL_COMMUNITY): Payer: Self-pay

## 2023-02-13 LAB — GENOSURE ARCHIVE(SM)

## 2023-02-14 ENCOUNTER — Other Ambulatory Visit (HOSPITAL_COMMUNITY): Payer: Self-pay

## 2023-02-21 ENCOUNTER — Other Ambulatory Visit (HOSPITAL_COMMUNITY): Payer: Self-pay

## 2023-02-21 LAB — SPECIMEN STATUS REPORT

## 2023-05-03 ENCOUNTER — Ambulatory Visit: Payer: Commercial Managed Care - PPO | Admitting: Internal Medicine

## 2023-05-04 ENCOUNTER — Encounter: Payer: Self-pay | Admitting: Internal Medicine

## 2023-05-04 ENCOUNTER — Other Ambulatory Visit: Payer: Self-pay

## 2023-05-04 ENCOUNTER — Other Ambulatory Visit (HOSPITAL_COMMUNITY): Payer: Self-pay

## 2023-05-04 ENCOUNTER — Ambulatory Visit (INDEPENDENT_AMBULATORY_CARE_PROVIDER_SITE_OTHER): Payer: Commercial Managed Care - PPO | Admitting: Internal Medicine

## 2023-05-04 ENCOUNTER — Other Ambulatory Visit (HOSPITAL_COMMUNITY)
Admission: RE | Admit: 2023-05-04 | Discharge: 2023-05-04 | Disposition: A | Discharge status: Home / Self Care | Payer: Commercial Managed Care - PPO | Source: Ambulatory Visit | Attending: Internal Medicine | Admitting: Internal Medicine

## 2023-05-04 VITALS — BP 132/87 | HR 88 | Resp 16 | Ht 70.0 in | Wt 229.0 lb

## 2023-05-04 DIAGNOSIS — Z113 Encounter for screening for infections with a predominantly sexual mode of transmission: Secondary | ICD-10-CM | POA: Insufficient documentation

## 2023-05-04 DIAGNOSIS — B20 Human immunodeficiency virus [HIV] disease: Secondary | ICD-10-CM | POA: Diagnosis not present

## 2023-05-04 NOTE — Patient Instructions (Addendum)
 Please see me in 1 months   1 month supply given   Our team Abe Abed will continue to look at options for you

## 2023-05-04 NOTE — Progress Notes (Signed)
 [facility]  Patient Active Problem List   Diagnosis Date Noted   Irritant contact dermatitis 02/09/2021   Current mild episode of major depressive disorder without prior episode (HCC) 04/09/2020   Sepsis (HCC) 03/15/2014   SIRS (systemic inflammatory response syndrome) (HCC) 03/15/2014   Pyrexia    HIV (human immunodeficiency virus infection) (HCC)    Abdominal pain, acute    Nausea vomiting and diarrhea 03/14/2014   History of kidney stones 08/12/2011   Abdominal pain, R sided periumbilical 05/25/2011   Human immunodeficiency virus (HIV) disease (HCC) 02/10/2006   Regional enteritis of small bowel (HCC) 02/10/2006   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   MRN: [id number]  Cc: hiv care  HPI:  Eric Canner. is a 58 y.o. 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 Last seen dr Luiz 03/2020. Previously known frequent missing of medications. He attributed this noncompliance to his severe backpain as he could care less for taking the medications.  He has periods of well controlled hiv interspersed with high viral load count   Denies headache, weight loss, f/c/nightsweat, cough, skin rash, penile discharge, n/v/diarrhea  His pain is well controlled since he has been on testosterone  replacement  Social - working all the time. Still doing own business in sheetrock/paint, and also started working for a [employer] since a week prior to this visit -- [employer]. No smoking/drinking/drugs. Not in a current relationship. 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 He is still looking for a PCP.  He just enrolled in [facility] (consent signed 01/2021). He remains on prezista  and genvoya . Missing doses still. He doesn't really bring up any reason for missing dose except having a routine.  He owns his own house No f c n v diarrhea rash   01/24/23 id clinic f/u See a&P for detail A little run down not sure he caught something or over exert himself at work   05/04/23 id clinic f/u Patient was in [phone] but lost to follow up Research group sent message to him 01/2023 and has been trying to reach him but unsuccessfully He said he hasn't been on any ART  Viral load done by research group 01/2023 was 64  See a&p for detail      Allergies  Allergen Reactions   Oxycodone Hcl Other (See Comments)   Percocet [Oxycodone-Acetaminophen ] Itching and Other (See Comments)    Extreme agitation      Outpatient Medications Prior to Visit  Medication Sig Dispense Refill   testosterone  cypionate (DEPO-TESTOSTERONE ) 200 MG/ML injection Inject 0.5 mLs (100 mg total) into the muscle every 7 (seven) days. 10 mL 0   testosterone  cypionate (DEPOTESTOSTERONE CYPIONATE) 200 MG/ML injection Inject 0.5 MLs into the muscle once every 7 days (Patient not taking: Reported on  05/04/2023) 10 mL 0   TUBERCULIN SYR 1CC/25GX5/8 25G X 5/8 1 ML MISC  (Patient not taking: Reported on 05/04/2023)     No facility-administered medications prior to visit.     Social History   Socioeconomic History   Marital status: Single    Spouse name: Not on file   Number of children: Not on file   Years of education: Not on file   Highest education level: Not on file  Occupational History   Not on file  Tobacco Use   Smoking status: Never   Smokeless tobacco: Never   Vaping Use   Vaping status: Never Used  Substance and Sexual Activity   Alcohol use: Yes    Alcohol/week: 0.0 standard drinks of alcohol    Comment: occ   Drug use: No   Sexual activity: Yes    Partners: Male    Birth control/protection: Condom    Comment: decined condoms  Other Topics Concern   Not on file  Social History Narrative   Single,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   Social Drivers of Corporate Investment Banker Strain: Not on file  Food Insecurity: Not on file  Transportation Needs: Not on file  Physical Activity: Not on file  Stress: Not on file  Social Connections: Not on file  Intimate Partner Violence: Not on file      Review of Systems    All other ros negative  Objective:    BP 132/87   Pulse 88   Resp 16   Ht 5' 10 (1.778 m)   Wt 229 lb (103.9 kg)   BMI 32.86 kg/m  Nursing note and vital signs reviewed.  Physical Exam     General/constitutional: no distress, pleasant HEENT: Normocephalic, PER, Conj Clear, EOMI, Oropharynx clear Neck supple CV: rrr no mrg Lungs: clear to auscultation, normal respiratory effort Abd: Soft, Nontender Ext: no edema Skin: No Rash Neuro: nonfocal MSK: no peripheral joint swelling/tenderness/warmth; back spines nontender       Labs: Lab Results  Component Value Date   WBC 10.0 08/12/2022   HGB 16.6 08/12/2022   HCT 49.0 08/12/2022   MCV 82.9 08/12/2022   PLT 301 08/12/2022   Last metabolic panel Lab Results  Component Value Date   GLUCOSE 76 01/27/2023   NA 138 01/27/2023   K 3.7 01/27/2023   CL 100 01/27/2023   CO2 29 01/27/2023   BUN 9 01/27/2023   CREATININE 1.09 01/27/2023   GFRNONAA >60 05/25/2021   GFRAA 113 09/02/2020   CALCIUM 9.6 01/27/2023   PROT 7.0 01/27/2023   ALBUMIN 3.2 (L) 05/25/2021   BILITOT 0.7 01/27/2023   ALKPHOS 32 (L) 05/25/2021   AST 13 01/27/2023   ALT 21 01/27/2023   ANIONGAP 6 05/25/2021    Hiv: 01/2023           64 10/24              399        / 12/03/20        98k 04/09/20        99           /          526 (24%) 06/2019         64k         /          313 (18%) 01/2019         28           /  483 (24%) 03/2018           01/2017         UD  Micro:  Serology: 11/2020 rpr 1:1 11/2020 hep b cAb reactive, sag negative; hep a igm negative; hep c ab negative 11/2020 quant gold negative 11/2020 urine and oropharyngeal gc/chlam negative   03/2020 rpr 1:1  Imaging:  Assessment & Plan:   Problem List Items Addressed This Visit   None Visit Diagnoses       HIV disease (HCC)    -  Primary   Relevant Orders   HIV 1 RNA quant-no reflex-bld   T-helper cells (CD4) count   CBC   COMPLETE METABOLIC PANEL WITH GFR     Screening for STDs (sexually transmitted diseases)       Relevant Orders   RPR   Urine cytology ancillary only([place])   Cytology (oral, anal, urethral) ancillary only   Cytology (oral, anal, urethral) ancillary only         #hiv: Noncompliant history Currently since 11/2020, at least, remains on genvoya  and prezista  Need repeat labs today as previously was off therapy when saw me in 11/2020  Patient enrolled in [facility] as of 01/2021 Still missing doses Talked about getting in a routine  12/30/22 started cabenuva end of 2023; he is in the study A5359   Lab Results  Component Value Date   HIV1RNAQUANT 64 (H) 01/27/2023   Lab Results  Component Value Date   CD4TCELL 24 (L) 04/09/2020   CD4TABS 526 04/09/2020    05/04/23 id clinic assessment Lost to latitude actg 5359 follow up; I reengaged research group with him today Reviewed genotype 12/2020 and in 2017; no biktegravir resistance or nrti/nnrti/pi resistance  -will restart biktarvy  -- 1 month sample given -discussed u=u -encourage compliance -labs today -f/u in 4-6 weeks    #social Some insurance issue; over income for Eric Chapman Will given biktarvy  sample for a month and sort it out   #hx  syphilis #std screening Prior treated 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 He doesn't have a pcp and asked to be referred to one  -referral to internal medicine teaching service for pcp care -01/24/23 stated that testosterone  is too expensive; he is ok with refill     #hcm: -vaccination Prevnar 2022 Tdap 2014 Meningococcal will reviewed -hepatitis Hep b immune; 12/2022 sAb 366 01/2021 hep c Ab negative -std 11/2020 rpr stable titer 1:1 See above -annual tb screen 11/2020 quant gold negative -Cancer screening Will need colon cancer fit test -- ordered 01/21/23 -- not done yet; will review Will discuss anal cancer screening next visit 6 months      Follow-up: Return in about 4 weeks (around 06/01/2023).      Constance ONEIDA Passer, MD [facility] [phone]  pager   [phone] cell 05/04/2023, 3:37 PM

## 2023-05-05 LAB — T-HELPER CELLS (CD4) COUNT (NOT AT ARMC)
CD4 % Helper T Cell: 26 % — ABNORMAL LOW (ref 33–65)
CD4 T Cell Abs: 583 /uL (ref 400–1790)

## 2023-05-06 LAB — CBC
HCT: 49.1 % (ref 38.5–50.0)
Hemoglobin: 15.7 g/dL (ref 13.2–17.1)
MCH: 27.4 pg (ref 27.0–33.0)
MCHC: 32 g/dL (ref 32.0–36.0)
MCV: 85.5 fL (ref 80.0–100.0)
MPV: 9.6 fL (ref 7.5–12.5)
Platelets: 377 10*3/uL (ref 140–400)
RBC: 5.74 10*6/uL (ref 4.20–5.80)
RDW: 12.9 % (ref 11.0–15.0)
WBC: 9.8 10*3/uL (ref 3.8–10.8)

## 2023-05-06 LAB — COMPLETE METABOLIC PANEL WITH GFR
AG Ratio: 1.5 (calc) (ref 1.0–2.5)
ALT: 29 U/L (ref 9–46)
AST: 18 U/L (ref 10–35)
Albumin: 4.4 g/dL (ref 3.6–5.1)
Alkaline phosphatase (APISO): 41 U/L (ref 35–144)
BUN: 12 mg/dL (ref 7–25)
CO2: 27 mmol/L (ref 20–32)
Calcium: 10 mg/dL (ref 8.6–10.3)
Chloride: 103 mmol/L (ref 98–110)
Creat: 1.02 mg/dL (ref 0.70–1.30)
Globulin: 2.9 g/dL (ref 1.9–3.7)
Glucose, Bld: 100 mg/dL — ABNORMAL HIGH (ref 65–99)
Potassium: 4.5 mmol/L (ref 3.5–5.3)
Sodium: 140 mmol/L (ref 135–146)
Total Bilirubin: 0.4 mg/dL (ref 0.2–1.2)
Total Protein: 7.3 g/dL (ref 6.1–8.1)
eGFR: 86 mL/min/{1.73_m2} (ref >=60)

## 2023-05-06 LAB — RPR: RPR Ser Ql: REACTIVE — AB

## 2023-05-06 LAB — HIV-1 RNA QUANT-NO REFLEX-BLD
HIV 1 RNA Quant: 65 {copies}/mL — ABNORMAL HIGH
HIV-1 RNA Quant, Log: 1.81 {Log} — ABNORMAL HIGH

## 2023-05-06 LAB — RPR TITER: RPR Titer: 1:32 {titer} — ABNORMAL HIGH

## 2023-05-06 LAB — T PALLIDUM AB: T Pallidum Abs: POSITIVE — AB

## 2023-05-08 ENCOUNTER — Telehealth: Payer: Self-pay

## 2023-05-08 LAB — CYTOLOGY, (ORAL, ANAL, URETHRAL) ANCILLARY ONLY
Chlamydia: NEGATIVE
Chlamydia: NEGATIVE
Comment: NEGATIVE
Comment: NEGATIVE
Comment: NORMAL
Comment: NORMAL
Neisseria Gonorrhea: NEGATIVE
Neisseria Gonorrhea: NEGATIVE

## 2023-05-08 LAB — URINE CYTOLOGY ANCILLARY ONLY
Chlamydia: NEGATIVE
Comment: NEGATIVE
Comment: NEGATIVE
Comment: NORMAL
Neisseria Gonorrhea: NEGATIVE
Trichomonas: NEGATIVE

## 2023-05-08 NOTE — Telephone Encounter (Signed)
 Thanks.  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

## 2023-05-08 NOTE — Telephone Encounter (Signed)
 Please review RPR. DIS called regarding the results

## 2023-05-09 ENCOUNTER — Other Ambulatory Visit (HOSPITAL_COMMUNITY): Payer: Self-pay

## 2023-05-09 NOTE — Telephone Encounter (Signed)
Called patient to discuss results and need for treatment, no answer and voicemail full.   Sandie Ano, RN

## 2023-05-10 ENCOUNTER — Telehealth: Payer: Self-pay

## 2023-05-10 ENCOUNTER — Ambulatory Visit (INDEPENDENT_AMBULATORY_CARE_PROVIDER_SITE_OTHER): Payer: Commercial Managed Care - PPO

## 2023-05-10 ENCOUNTER — Other Ambulatory Visit: Payer: Self-pay | Admitting: Pharmacist

## 2023-05-10 ENCOUNTER — Other Ambulatory Visit: Payer: Self-pay

## 2023-05-10 DIAGNOSIS — B20 Human immunodeficiency virus [HIV] disease: Secondary | ICD-10-CM

## 2023-05-10 DIAGNOSIS — A539 Syphilis, unspecified: Secondary | ICD-10-CM | POA: Diagnosis not present

## 2023-05-10 MED ORDER — BIKTARVY 50-200-25 MG PO TABS: 1.0000 | ORAL_TABLET | Freq: Every day | ORAL | Status: AC

## 2023-05-10 MED ORDER — PENICILLIN G BENZATHINE 1200000 UNIT/2ML IM SUSY
2.4000 10*6.[IU] | PREFILLED_SYRINGE | Freq: Once | INTRAMUSCULAR | Status: AC
  Administered 2023-05-10: 2.4 10*6.[IU] via INTRAMUSCULAR

## 2023-05-10 NOTE — Telephone Encounter (Signed)
[facility] Patient Advocate Encounter   Received notification from [employer] that prior authorization for Eric Chapman is required.   PA submitted on 05/10/23 Fax chart notes & labs to [phone] Status is pending    [facility] Clinic will continue to follow.   Clearance Coots, CPhT Specialty Pharmacy Patient [facility] Phone: [phone] Fax:  [phone]

## 2023-05-10 NOTE — Telephone Encounter (Signed)
Second attempt to reach patient, no answer. Left HIPAA compliant voicemail requesting callback.   Sandie Ano, RN

## 2023-05-10 NOTE — Telephone Encounter (Signed)
Patient returned call, relayed need for treatment with Bicillin due to increase in RPR titer. He will come in for first set of injections today.   Sandie Ano, RN

## 2023-05-10 NOTE — Progress Notes (Signed)
Medication Samples have been provided to the patient.  Drug name: Biktarvy        Strength: 50/200/25 mg       Qty: 28 tablets   LOT: [id number]   Exp.Date: 12/2024  Samples requested by Dr. Renold Don.  Dosing instructions: Take one tablet by mouth once daily  The patient has been instructed regarding the correct time, dose, and frequency of taking this medication, including desired effects and most common side effects.   Eric Chapman L. Jannette Fogo, PharmD, BCIDP, AAHIVP, CPP Clinical Pharmacist Practitioner Infectious Diseases Clinical Pharmacist [facility] 03/09/2020, 10:07 AM

## 2023-05-16 ENCOUNTER — Other Ambulatory Visit: Payer: Self-pay

## 2023-05-16 ENCOUNTER — Ambulatory Visit (INDEPENDENT_AMBULATORY_CARE_PROVIDER_SITE_OTHER): Payer: Commercial Managed Care - PPO

## 2023-05-16 DIAGNOSIS — A539 Syphilis, unspecified: Secondary | ICD-10-CM

## 2023-05-16 MED ORDER — PENICILLIN G BENZATHINE 1200000 UNIT/2ML IM SUSY
2.4000 10*6.[IU] | PREFILLED_SYRINGE | Freq: Once | INTRAMUSCULAR | Status: AC
Start: 2023-05-16 — End: 2023-05-16
  Administered 2023-05-16: 2.4 10*6.[IU] via INTRAMUSCULAR

## 2023-05-17 ENCOUNTER — Other Ambulatory Visit (HOSPITAL_COMMUNITY): Payer: Self-pay

## 2023-05-17 ENCOUNTER — Ambulatory Visit: Payer: Commercial Managed Care - PPO

## 2023-05-24 ENCOUNTER — Ambulatory Visit (INDEPENDENT_AMBULATORY_CARE_PROVIDER_SITE_OTHER): Payer: Commercial Managed Care - PPO

## 2023-05-24 ENCOUNTER — Other Ambulatory Visit: Payer: Self-pay

## 2023-05-24 DIAGNOSIS — A539 Syphilis, unspecified: Secondary | ICD-10-CM

## 2023-05-24 MED ORDER — PENICILLIN G BENZATHINE 1200000 UNIT/2ML IM SUSY
2.4000 10*6.[IU] | PREFILLED_SYRINGE | Freq: Once | INTRAMUSCULAR | Status: AC
  Administered 2023-05-24: 2.4 10*6.[IU] via INTRAMUSCULAR

## 2023-05-31 ENCOUNTER — Telehealth: Payer: Self-pay

## 2023-05-31 ENCOUNTER — Other Ambulatory Visit (HOSPITAL_COMMUNITY): Payer: Self-pay

## 2023-05-31 NOTE — Telephone Encounter (Signed)
 [facility] Patient Advocate Encounter   Patient has been approved for [employer] Advancing Access Patient Assistance Program for [place]  from 05/31/23 to 05/30/24.  This assistance will make the patient's copay 0.00.  I have spoken with the patient.  The billing information is as follows and has been shared with [facility].         Patient knows to call the office with questions or concerns.  Clearance Coots, CPhT Specialty Pharmacy Patient [facility] for Infectious Disease Phone: [phone] Fax: [phone] 05/31/2023 9:30 AM

## 2023-06-16 ENCOUNTER — Ambulatory Visit: Payer: Commercial Managed Care - PPO | Admitting: Internal Medicine

## 2023-07-13 ENCOUNTER — Ambulatory Visit: Payer: Commercial Managed Care - PPO | Admitting: Internal Medicine

## 2023-07-25 ENCOUNTER — Other Ambulatory Visit: Payer: Self-pay

## 2023-07-25 ENCOUNTER — Other Ambulatory Visit (HOSPITAL_COMMUNITY): Payer: Self-pay

## 2023-07-25 ENCOUNTER — Ambulatory Visit: Admitting: Internal Medicine

## 2023-07-25 ENCOUNTER — Encounter: Payer: Self-pay | Admitting: Internal Medicine

## 2023-07-25 ENCOUNTER — Other Ambulatory Visit (HOSPITAL_COMMUNITY)
Admission: RE | Admit: 2023-07-25 | Discharge: 2023-07-25 | Disposition: A | Discharge status: Home / Self Care | Source: Ambulatory Visit | Attending: Internal Medicine | Admitting: Internal Medicine

## 2023-07-25 ENCOUNTER — Other Ambulatory Visit: Payer: Self-pay | Admitting: Pharmacist

## 2023-07-25 VITALS — BP 129/86 | HR 90 | Ht 70.0 in | Wt 233.0 lb

## 2023-07-25 DIAGNOSIS — Z113 Encounter for screening for infections with a predominantly sexual mode of transmission: Secondary | ICD-10-CM | POA: Diagnosis present

## 2023-07-25 DIAGNOSIS — B20 Human immunodeficiency virus [HIV] disease: Secondary | ICD-10-CM

## 2023-07-25 MED ORDER — BICTEGRAVIR-EMTRICITAB-TENOFOV 50-200-25 MG PO TABS
1.0000 | ORAL_TABLET | Freq: Every day | ORAL | 5 refills | Status: DC
  Filled 2023-07-25: qty 30, 30d supply, fill #0
  Filled 2024-01-24: qty 30, 30d supply, fill #1

## 2023-07-25 NOTE — Progress Notes (Signed)
 Specialty Pharmacy Initiation Note   Eric Chapman. is a 58 y.o. male who will be followed by the specialty pharmacy service for RxSp HIV    Review of administration, indication, effectiveness, safety, potential side effects, storage/disposable, and missed dose instructions occurred today for patient's specialty medication(s) Bictegravir-Emtricitab-Tenofov (BIKTARVY )     Patient/Caregiver did not have any additional questions or concerns.   Patient's therapy is appropriate to: Initiate    Goals Addressed             This Visit's Progress    Achieve Undetectable HIV Viral Load < 20       Patient is initiating therapy. Patient will maintain adherence and work on increased adherence.      Comply with lab assessments       Patient is initiating therapy. Patient will adhere to provider and/or lab appointments.      Improve or maintain quality of life       Patient is initiating therapy. Patient will be monitored by provider to determine if a change in treatment plan is warranted.        Eric Chapman L. Xiong Haidar, PharmD, BCIDP, AAHIVP, CPP Infectious Diseases Clinical Pharmacist Practitioner Clinical Pharmacist Lead, Specialty Pharmacy [facility] for Infectious Disease 07/25/2023, 1:02 PM

## 2023-07-25 NOTE — Patient Instructions (Addendum)
 Restart biktarvy   Please talk to our finance office  I have contacted research to see if you are eligible for any study  Please see me again in 6 weeks

## 2023-07-25 NOTE — Progress Notes (Signed)
 Specialty Pharmacy Initial Fill Coordination Note  Eric Chapman. is a 58 y.o. male contacted today regarding initial fill of specialty medication(s) Bictegravir-Emtricitab-Tenofov (BIKTARVY )   Patient requested Courier to Provider Office   Delivery date: 07/27/23   Verified address: [facility] [address] [place] [address]   Medication will be filled on 07/26/23.   Patient is aware of $0 copayment.

## 2023-07-25 NOTE — Progress Notes (Signed)
 [facility]  Patient Active Problem List   Diagnosis Date Noted   Irritant contact dermatitis 02/09/2021   Current mild episode of major depressive disorder without prior episode (HCC) 04/09/2020   Sepsis (HCC) 03/15/2014   SIRS (systemic inflammatory response syndrome) (HCC) 03/15/2014   Pyrexia    HIV (human immunodeficiency virus infection) (HCC)    Abdominal pain, acute    Nausea vomiting and diarrhea 03/14/2014   History of kidney stones 08/12/2011   Abdominal pain, R sided periumbilical 05/25/2011   Human immunodeficiency virus (HIV) disease (HCC) 02/10/2006   Regional enteritis of small bowel (HCC) 02/10/2006   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   MRN: [id number]  Cc: hiv care  HPI:  Eric Von. is a 58 y.o. 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 Last seen dr Levern Reader 03/2020. Previously known frequent missing of medications. 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 No smoking/drinking/drugs. Not in a current relationship. 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 He is still looking for a PCP.  He just enrolled in Lat[employer] (consent signed 01/2021). He remains on prezista  and genvoya . Missing doses still. He doesn't really bring up any reason for missing dose except "having a routine."  He owns his own house No f c n v diarrhea rash   01/24/23 id clinic f/u See a&P for detail A little run down not sure he caught something or over exert himself at work   05/04/23 id clinic f/u Patient was in [phone] but lost to follow up Research group sent message to him 01/2023 and has been trying to reach him but unsuccessfully He said he hasn't been on any ART  Viral load done by research group 01/2023 was 64  See a&p for detail   07/25/23 id clinic f/u See a&p   Allergies  Allergen Reactions   Oxycodone Hcl Other (See Comments)   Percocet [Oxycodone-Acetaminophen ] Itching and Other (See Comments)    Extreme agitation      Outpatient Medications Prior to Visit  Medication Sig Dispense Refill   testosterone  cypionate (DEPO-TESTOSTERONE ) 200 MG/ML injection Inject 0.5 mLs (100 mg total) into the muscle every 7 (seven) days. 10 mL 0   testosterone  cypionate (DEPOTESTOSTERONE CYPIONATE) 200 MG/ML injection Inject 0.5 MLs into the muscle once every 7 days (  Patient not taking: Reported on 05/04/2023) 10 mL 0   TUBERCULIN SYR 1CC/25GX5/8" 25G X 5/8" 1 ML MISC  (Patient not taking: Reported on 05/04/2023)     No facility-administered medications prior to visit.     Social History   Socioeconomic History   Marital status: Single    Spouse name: Not on file   Number of children: Not on file   Years of education: Not on file   Highest education level: Not on file  Occupational History   Not on file  Tobacco Use   Smoking status: Never    Smokeless tobacco: Never  Vaping Use   Vaping status: Never Used  Substance and Sexual Activity   Alcohol use: Yes    Alcohol/week: 0.0 standard drinks of alcohol    Comment: occ   Drug use: No   Sexual activity: Yes    Partners: Male    Birth control/protection: Condom    Comment: decined condoms  Other Topics Concern   Not on file  Social History Narrative   Single,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   Social Drivers of Corporate investment banker Strain: Not on file  Food Insecurity: Not on file  Transportation Needs: Not on file  Physical Activity: Not on file  Stress: Not on file  Social Connections: Not on file  Intimate Partner Violence: Not on file      Review of Systems    All other ros negative  Objective:    BP 129/86   Pulse 90   Ht 5\' 10"  (1.778 m)   Wt 233 lb (105.7 kg)   SpO2 94%   BMI 33.43 kg/m  Nursing note and vital signs reviewed.  Physical Exam     General/constitutional: no distress, pleasant HEENT: Normocephalic, PER, Conj Clear, EOMI, Oropharynx clear Neck supple CV: rrr no mrg Lungs: clear to auscultation, normal respiratory effort Abd: Soft, Nontender Ext: no edema Skin: No Rash Neuro: nonfocal MSK: no peripheral joint swelling/tenderness/warmth; back spines nontender       Labs: Lab Results  Component Value Date   WBC 9.8 05/04/2023   HGB 15.7 05/04/2023   HCT 49.1 05/04/2023   MCV 85.5 05/04/2023   PLT 377 05/04/2023   Last metabolic panel Lab Results  Component Value Date   GLUCOSE 100 (H) 05/04/2023   NA 140 05/04/2023   K 4.5 05/04/2023   CL 103 05/04/2023   CO2 27 05/04/2023   BUN 12 05/04/2023   CREATININE 1.02 05/04/2023   GFRNONAA >60 05/25/2021   GFRAA 113 09/02/2020   CALCIUM 10.0 05/04/2023   PROT 7.3 05/04/2023   ALBUMIN 3.2 (L) 05/25/2021   BILITOT 0.4 05/04/2023   ALKPHOS 32 (L) 05/25/2021   AST 18 05/04/2023   ALT 29 05/04/2023   ANIONGAP 6 05/25/2021     Hiv: 01/2023           64 10/24             399        / 12/03/20        98k 04/09/20        99           /          526 (24%) 06/2019         64k         /          313 (18%) 01/2019  28           /          483 (24%) 03/2018           01/2017         UD  Micro:  Serology: 11/2020 rpr 1:1 11/2020 hep b cAb reactive, sag negative; hep a igm negative; hep c ab negative 11/2020 quant gold negative 11/2020 urine and oropharyngeal gc/chlam negative   03/2020 rpr 1:1  Imaging:  Assessment & Plan:   Problem List Items Addressed This Visit   None Visit Diagnoses       HIV disease (HCC)    -  Primary   Relevant Orders   HIV 1 RNA quant-no reflex-bld   CBC   COMPLETE METABOLIC PANEL WITHOUT GFR     Screen for STD (sexually transmitted disease)       Relevant Orders   Urine cytology ancillary only   Cytology (oral, anal, urethral) ancillary only   Cytology (oral, anal, urethral) ancillary only   RPR          #hiv: Noncompliant history Currently since 11/2020, at least, remains on genvoya  and prezista  Need repeat labs today as previously was off therapy when saw me in 11/2020  Patient enrolled in latitude as of 01/2021 Still missing doses Talked about getting in a "routine"  12/30/22 started cabenuva end of 2023; he is in the study A5359   Lab Results  Component Value Date   HIV1RNAQUANT 65 (H) 05/04/2023   Lab Results  Component Value Date   CD4TCELL 26 (L) 05/04/2023   CD4TABS 583 05/04/2023    05/04/23 id clinic assessment Lost to latitude actg 5359 follow up; I reengaged research group with him today Reviewed genotype 12/2020 and in 2017; no biktegravir resistance or nrti/nnrti/pi resistance. He didn't want to go back to research  07/25/23 id clinic assessment He has new insurance -- working for [employer] for over a year He hasn't taken any medication for over a month ?not covered He wants to talk to our finance team again  today    Discussed lenacapvir/cabenuva not yet active study but potential candidate. 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 He doesn't have a pcp and asked to be referred to one  -referral to internal medicine teaching service for pcp care -01/24/23 stated that testosterone  is too expensive; he is ok with refill     #hcm: -vaccination Prevnar 2022 Tdap 2014 Meningococcal will reviewed -hepatitis Hep b immune; 12/2022 sAb 366 01/2021 hep c Ab negative -std 11/2020 rpr stable titer 1:1 See above -annual tb screen 11/2020 quant gold negative -Cancer screening Will need colon cancer fit test -- ordered 01/21/23 -- not done yet; will review Will discuss anal cancer  screening next visit 6 months      Follow-up: No follow-ups on file.      Jamesetta Mcbride, MD [facility] for Infectious Disease [facility] [phone]  pager   [phone] cell 07/25/2023, 10:48 AM

## 2023-07-26 ENCOUNTER — Telehealth: Payer: Self-pay

## 2023-07-26 ENCOUNTER — Other Ambulatory Visit: Payer: Self-pay

## 2023-07-26 LAB — CYTOLOGY, (ORAL, ANAL, URETHRAL) ANCILLARY ONLY
Chlamydia: NEGATIVE
Chlamydia: NEGATIVE
Comment: NEGATIVE
Comment: NEGATIVE
Comment: NORMAL
Comment: NORMAL
Neisseria Gonorrhea: NEGATIVE
Neisseria Gonorrhea: NEGATIVE

## 2023-07-26 LAB — URINE CYTOLOGY ANCILLARY ONLY
Chlamydia: NEGATIVE
Comment: NEGATIVE
Comment: NEGATIVE
Comment: NORMAL
Neisseria Gonorrhea: NEGATIVE
Trichomonas: NEGATIVE

## 2023-07-26 NOTE — Telephone Encounter (Signed)
 [facility] Patient Advocate Encounter  Patient's medications BIKTARVY have been couriered to [facility] from [facility] pharmacy and will be picked up at [facility].  Kae Heller, CPhT Specialty Pharmacy Patient [facility] for Infectious Disease Phone: [phone] Fax:  [phone]

## 2023-07-27 LAB — COMPLETE METABOLIC PANEL WITHOUT GFR
AG Ratio: 1.8 (calc) (ref 1.0–2.5)
ALT: 25 U/L (ref 9–46)
AST: 21 U/L (ref 10–35)
Albumin: 4.4 g/dL (ref 3.6–5.1)
Alkaline phosphatase (APISO): 39 U/L (ref 35–144)
BUN: 11 mg/dL (ref 7–25)
CO2: 27 mmol/L (ref 20–32)
Calcium: 9 mg/dL (ref 8.6–10.3)
Chloride: 100 mmol/L (ref 98–110)
Creat: 0.91 mg/dL (ref 0.70–1.30)
Globulin: 2.5 g/dL (ref 1.9–3.7)
Glucose, Bld: 85 mg/dL (ref 65–99)
Potassium: 4.3 mmol/L (ref 3.5–5.3)
Sodium: 136 mmol/L (ref 135–146)
Total Bilirubin: 0.7 mg/dL (ref 0.2–1.2)
Total Protein: 6.9 g/dL (ref 6.1–8.1)

## 2023-07-27 LAB — CBC
HCT: 51.2 % — ABNORMAL HIGH (ref 38.5–50.0)
Hemoglobin: 16.3 g/dL (ref 13.2–17.1)
MCH: 27.8 pg (ref 27.0–33.0)
MCHC: 31.8 g/dL — ABNORMAL LOW (ref 32.0–36.0)
MCV: 87.2 fL (ref 80.0–100.0)
MPV: 9.2 fL (ref 7.5–12.5)
Platelets: 329 10*3/uL (ref 140–400)
RBC: 5.87 10*6/uL — ABNORMAL HIGH (ref 4.20–5.80)
RDW: 13.1 % (ref 11.0–15.0)
WBC: 9.3 10*3/uL (ref 3.8–10.8)

## 2023-07-27 LAB — T PALLIDUM AB: T Pallidum Abs: POSITIVE — AB

## 2023-07-27 LAB — RPR TITER: RPR Titer: 1:8 {titer} — ABNORMAL HIGH

## 2023-07-27 LAB — HIV-1 RNA QUANT-NO REFLEX-BLD
HIV 1 RNA Quant: 3190 {copies}/mL — ABNORMAL HIGH
HIV-1 RNA Quant, Log: 3.5 {Log_copies}/mL — ABNORMAL HIGH

## 2023-07-27 LAB — RPR: RPR Ser Ql: REACTIVE — AB

## 2023-07-28 ENCOUNTER — Encounter: Payer: Self-pay | Admitting: Internal Medicine

## 2023-08-15 ENCOUNTER — Other Ambulatory Visit (HOSPITAL_COMMUNITY): Payer: Self-pay

## 2023-08-18 NOTE — Progress Notes (Signed)
 The 10-year ASCVD risk score (Arnett DK, et al., 2019) is: 6.1%   Values used to calculate the score:     Age: 58 years     Sex: Male     Is Non-Hispanic African American: No     Diabetic: No     Tobacco smoker: No     Systolic Blood Pressure: 129 mmHg     Is BP treated: No     HDL Cholesterol: 33 mg/dL     Total Cholesterol: 131 mg/dL  No current statin therapy, next appointment note updated.   Taylorann Tkach, BSN, RN

## 2023-09-07 ENCOUNTER — Encounter: Payer: Self-pay | Admitting: Internal Medicine

## 2023-09-07 ENCOUNTER — Other Ambulatory Visit: Payer: Self-pay

## 2023-09-07 ENCOUNTER — Ambulatory Visit (INDEPENDENT_AMBULATORY_CARE_PROVIDER_SITE_OTHER): Admitting: Internal Medicine

## 2023-09-07 VITALS — BP 135/87 | HR 78 | Temp 98.0°F | Ht 70.0 in | Wt 225.0 lb

## 2023-09-07 DIAGNOSIS — B20 Human immunodeficiency virus [HIV] disease: Secondary | ICD-10-CM | POA: Diagnosis not present

## 2023-09-07 NOTE — Patient Instructions (Signed)
 I'll have you see pharmacy team to see if they have any trick up their sleeve to help you take medication more often   I have also inquired our research team to see if you qualify for an injectable study Embrace by [employer] (google and let me know)    See me in 3 months other wise

## 2023-09-07 NOTE — Progress Notes (Signed)
 [facility]  Patient Active Problem List   Diagnosis Date Noted   Irritant contact dermatitis 02/09/2021   Current mild episode of major depressive disorder without prior episode (HCC) 04/09/2020   Sepsis (HCC) 03/15/2014   SIRS (systemic inflammatory response syndrome) (HCC) 03/15/2014   Pyrexia    HIV (human immunodeficiency virus infection) (HCC)    Abdominal pain, acute    Nausea vomiting and diarrhea 03/14/2014   History of kidney stones 08/12/2011   Abdominal pain, R sided periumbilical 05/25/2011   Human immunodeficiency virus (HIV) disease (HCC) 02/10/2006   Regional enteritis of small bowel (HCC) 02/10/2006   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   MRN: [id number]  Cc: hiv care  HPI:  Eric Bracewell. is a 58 y.o. 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 Last seen dr Levern Reader 03/2020. Previously known frequent missing of medications. He attributed this noncompliance to his severe backpain as he could care less for taking the medications.  He has periods of well controlled hiv interspersed with high viral load count   Denies headache, weight loss, f/c/nightsweat, cough, skin rash, penile discharge, n/v/diarrhea  His pain is well controlled since he has been on testosterone  replacement  Social - working all the time. Still doing own business in sheetrock/paint, and also started working for a [employer] since a week prior to this visit -- [employer]. No smoking/drinking/drugs. Not in a current relationship. 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 He is still looking for a PCP.  He just enrolled in [employer] (consent signed 01/2021). He remains on prezista  and genvoya . Missing doses still. He doesn't really bring up any reason for missing dose except having a routine.  He owns his own house No f c n v diarrhea rash   01/24/23 id clinic f/u See a&P for detail A little run down not sure he caught something or over exert himself at work   05/04/23 id clinic f/u Patient was in [phone] but lost to follow up Research group sent message to him 01/2023 and has been trying to reach him but unsuccessfully He said he hasn't been on any ART  Viral load done by research group 01/2023 was 64  See a&p for detail   09/07/23 id clinic f/u  See a&p   Allergies  Allergen Reactions   Oxycodone Hcl Other (See Comments)   Percocet [Oxycodone-Acetaminophen ] Itching and Other (See Comments)    Extreme agitation      Outpatient Medications Prior to Visit  Medication Sig Dispense Refill   bictegravir-emtricitabine -tenofovir  AF (BIKTARVY ) 50-200-25 MG TABS tablet Take 1 tablet by mouth daily. 30 tablet 5   testosterone  cypionate (DEPO-TESTOSTERONE ) 200 MG/ML injection Inject 0.5 mLs (100 mg total) into the muscle every 7 (seven) days. (Patient not taking:  Reported on 09/07/2023) 10 mL 0   testosterone  cypionate (DEPOTESTOSTERONE CYPIONATE) 200 MG/ML injection Inject 0.5 MLs into the muscle once every 7 days (Patient not taking: Reported on 09/07/2023) 10 mL 0   TUBERCULIN SYR 1CC/25GX5/8 25G X 5/8 1 ML MISC  (Patient not taking: Reported on 09/07/2023)     No facility-administered medications prior to visit.     Social History   Socioeconomic History   Marital status: Single    Spouse name: Not on file   Number of  children: Not on file   Years of education: Not on file   Highest education level: Not on file  Occupational History   Not on file  Tobacco Use   Smoking status: Never   Smokeless tobacco: Never  Vaping Use   Vaping status: Never Used  Substance and Sexual Activity   Alcohol use: Yes    Alcohol/week: 0.0 standard drinks of alcohol    Comment: occ   Drug use: No   Sexual activity: Yes    Partners: Male    Birth control/protection: Condom    Comment: decined condoms  Other Topics Concern   Not on file  Social History Narrative   Single,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   Social Drivers of Corporate investment banker Strain: Not on file  Food Insecurity: Not on file  Transportation Needs: Not on file  Physical Activity: Not on file  Stress: Not on file  Social Connections: Not on file  Intimate Partner Violence: Not on file      Review of Systems    All other ros negative  Objective:    BP 135/87   Pulse 78   Temp 98 F (36.7 C) (Temporal)   Ht 5' 10 (1.778 m)   Wt 225 lb (102.1 kg)   SpO2 96%   BMI 32.28 kg/m  Nursing note and vital signs reviewed.  Physical Exam     General/constitutional: no distress, pleasant HEENT: Normocephalic, PER, Conj Clear, EOMI, Oropharynx clear Neck supple CV: rrr no mrg Lungs: clear to auscultation, normal respiratory effort Abd: Soft, Nontender Ext: no edema Skin: No Rash Neuro: nonfocal MSK: no peripheral joint swelling/tenderness/warmth; back spines nontender       Labs: Lab Results  Component Value Date   WBC 9.3 07/25/2023   HGB 16.3 07/25/2023   HCT 51.2 (H) 07/25/2023   MCV 87.2 07/25/2023   PLT 329 07/25/2023   Last metabolic panel Lab Results  Component Value Date   GLUCOSE 85 07/25/2023   NA 136 07/25/2023   K 4.3 07/25/2023   CL 100 07/25/2023   CO2 27 07/25/2023   BUN 11 07/25/2023   CREATININE 0.91 07/25/2023   GFRNONAA >60 05/25/2021   GFRAA 113 09/02/2020    CALCIUM 9.0 07/25/2023   PROT 6.9 07/25/2023   ALBUMIN 3.2 (L) 05/25/2021   BILITOT 0.7 07/25/2023   ALKPHOS 32 (L) 05/25/2021   AST 21 07/25/2023   ALT 25 07/25/2023   ANIONGAP 6 05/25/2021    Hiv: 01/2023           64 10/24             399        / 12/03/20        98k 04/09/20        99           /          526 (24%) 06/2019  64k         /          313 (18%) 01/2019         28           /          483 (24%) 03/2018           01/2017         UD  Micro:  Serology: 11/2020 rpr 1:1 11/2020 hep b cAb reactive, sag negative; hep a igm negative; hep c ab negative 11/2020 quant gold negative 11/2020 urine and oropharyngeal gc/chlam negative   03/2020 rpr 1:1  Imaging:  Assessment & Plan:   Problem List Items Addressed This Visit   None       #hiv: Noncompliant history Currently since 11/2020, at least, remains on genvoya  and prezista  Need repeat labs today as previously was off therapy when saw me in 11/2020  Patient enrolled in latitude as of 01/2021 Still missing doses Talked about getting in a routine  12/30/22 started cabenuva end of 2023; he is in the study A5359   Lab Results  Component Value Date   HIV1RNAQUANT 3,190 (H) 07/25/2023   Lab Results  Component Value Date   CD4TCELL 26 (L) 05/04/2023   CD4TABS 583 05/04/2023    05/04/23 id clinic assessment Lost to latitude actg 5359 follow up; I reengaged research group with him today Reviewed genotype 12/2020 and in 2017; no biktegravir resistance or nrti/nnrti/pi resistance. He didn't want to go back to research  07/25/23 id clinic assessment He has new insurance -- working for [employer] for over a year He hasn't taken any medication for over a month ?not covered He wants to talk to our finance team again today    Discussed lenacapvir/cabenuva not yet active study but potential candidate. Also potential candidate for the switch study but he'll need to be on biktarvy  for 6 months with good  virologic control   09/07/23 recently restarted biktarvy  06/2023. Missed 2 weeks continuously the last 4 weeks. He doesn't have a schedule and doesn't have his life together for a while Patient lives alone  He has a smart phone and has alarms but can't stick to it   -discussed u=u -encourage compliance -continue current medication biktarvy  -asked [facility] research team about embrace study from [employer] -labs today -see me in 3 months -f/u in 4-6 weeks pharmacy to help improve compliance    #social Some insurance issue; over income for ryan white Sexually active - but doesn't want std screening this visit New insurance will talk to finance   #hx syphilis #std screening Prior treated Rpr titer 1:1 since 2017 05/04/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 He doesn't have a pcp and asked to be referred to one  -referral to internal medicine teaching service for pcp care -01/24/23 stated that testosterone  is too expensive; he is ok with refill     #hcm: -vaccination Prevnar 2022 Tdap 2014 Meningococcal will reviewed -hepatitis Hep b immune; 12/2022 sAb 366 01/2021 hep c Ab negative -std 11/2020 rpr stable titer 1:1  See above -annual tb screen 11/2020 quant gold negative -Cancer screening Will need colon cancer fit test -- ordered 01/21/23 -- not done yet; will review Will discuss anal cancer screening next visit 6 months      Follow-up: Return in about 3 months (around 12/08/2023).      Jamesetta Mcbride, MD [facility] for Infectious Disease [facility] [phone]  pager    [phone] cell 09/07/2023, 4:25 PM

## 2023-09-09 LAB — HIV-1 RNA QUANT-NO REFLEX-BLD
HIV 1 RNA Quant: NOT DETECTED {copies}/mL
HIV-1 RNA Quant, Log: NOT DETECTED {Log_copies}/mL

## 2023-09-12 ENCOUNTER — Ambulatory Visit: Payer: Self-pay | Admitting: Internal Medicine

## 2023-10-10 ENCOUNTER — Telehealth: Payer: Self-pay | Admitting: Pharmacist

## 2023-10-10 ENCOUNTER — Other Ambulatory Visit: Payer: Self-pay

## 2023-10-10 NOTE — Telephone Encounter (Signed)
 Patient calls clinic today stating he feels very fatigued since starting Biktarvy  so much that it has impacted his ability to work as efficiently. Reviewed this is unfortunately a common side effects when first starting the medications but that it should fade out over the next few weeks as his body adjusts to the medication. Counseled him to reach out to us  if this does not improve or worsens over the next month. Stated he can reach out to our triage team or message Dr. Overton directly if interested in discussing work-related notes.   Alan Geralds, PharmD, CPP, BCIDP, AAHIVP Clinical Pharmacist Practitioner Infectious Diseases Clinical Pharmacist [facility] for Infectious Disease

## 2023-10-10 NOTE — Telephone Encounter (Signed)
 Patient called back, reports his employer has noticed changes in his work performance since he's started Biktarvy . He is wondering if Dr. Overton could provide him with a letter explaining this without disclosing his status. Will route to provider.   Shanquita Ronning, BSN, RN

## 2023-10-11 ENCOUNTER — Other Ambulatory Visit (HOSPITAL_COMMUNITY): Payer: Self-pay

## 2023-10-11 ENCOUNTER — Telehealth: Payer: Self-pay

## 2023-10-11 NOTE — Telephone Encounter (Addendum)
 Can you start PA for him? Thank you!

## 2023-10-11 NOTE — Telephone Encounter (Signed)
 Pharmacy Patient Advocate Encounter   Received notification from CoverMyMeds that prior authorization for [facility] is required/requested.   Insurance verification completed.   The patient is insured through [employer] .   Per test claim: PA required; PA submitted to above mentioned insurance via CoverMyMeds Key/confirmation #/EOC [id number] Status is pending   CLINICAL QUESTIONS ANSWERED

## 2023-10-11 NOTE — Telephone Encounter (Signed)
 Dr. Overton - I am hopeful the side effect should fade out over the next few weeks.   Donna/Courtney - can you look into Symtuza for this patient?

## 2023-10-11 NOTE — Telephone Encounter (Signed)
 Sounds good. Alan I will defer to you. Maybe a pharmacy visit  He is someone who had been harder in the past to comply. So anything that takes his mind of  Thanks

## 2023-10-11 NOTE — Telephone Encounter (Signed)
 Perhaps we can try synmtuza?  Thanks guys

## 2023-10-11 NOTE — Telephone Encounter (Signed)
 We'll see what [employer] coverage looks like. Once the PA is approved, I will call him and discuss his options. Thank you!

## 2023-10-19 ENCOUNTER — Other Ambulatory Visit (HOSPITAL_COMMUNITY): Payer: Self-pay

## 2023-10-19 ENCOUNTER — Telehealth: Payer: Self-pay

## 2023-10-19 NOTE — Telephone Encounter (Signed)
 Received notification from [facility] regarding a prior authorization for Symtuza. Authorization has been APPROVED from 10/18/23 to 10/17/24.   Per test claim, copay for 30 days supply is $0.00  Patient can fill through [facility]: [phone]   Authorization # [id number] Phone # [phone]

## 2023-10-19 NOTE — Telephone Encounter (Signed)
 Attempted to call patient today to discuss Symtuza approval and ability switch off of Biktarvy . Patient has been experiencing fatigue which was impacting his ability to work while on Biktarvy . Geophysicist/field seismologist full. Will reach out again next week. If patient returns call while I'm out of office, he can switch to [employer]. GLENWOOD Palma

## 2023-11-07 ENCOUNTER — Other Ambulatory Visit (HOSPITAL_COMMUNITY): Payer: Self-pay

## 2023-12-08 ENCOUNTER — Ambulatory Visit: Admitting: Internal Medicine

## 2024-01-19 ENCOUNTER — Other Ambulatory Visit: Payer: Self-pay | Admitting: Pharmacist

## 2024-01-19 NOTE — Progress Notes (Signed)
 Clinic and call center cannot reach for refills or appointments - inactivating.  Alan Geralds, PharmD, CPP, BCIDP, AAHIVP Clinical Pharmacist Practitioner Infectious Diseases Clinical Pharmacist [facility] for Infectious Disease

## 2024-01-24 ENCOUNTER — Other Ambulatory Visit: Payer: Self-pay

## 2024-01-24 ENCOUNTER — Other Ambulatory Visit (HOSPITAL_COMMUNITY): Payer: Self-pay

## 2024-01-24 ENCOUNTER — Other Ambulatory Visit (HOSPITAL_COMMUNITY)
Admission: RE | Admit: 2024-01-24 | Discharge: 2024-01-24 | Disposition: A | Discharge status: Home / Self Care | Source: Ambulatory Visit | Attending: Internal Medicine | Admitting: Internal Medicine

## 2024-01-24 ENCOUNTER — Other Ambulatory Visit: Payer: Self-pay | Admitting: Pharmacist

## 2024-01-24 ENCOUNTER — Ambulatory Visit (INDEPENDENT_AMBULATORY_CARE_PROVIDER_SITE_OTHER): Admitting: Internal Medicine

## 2024-01-24 VITALS — BP 148/94 | HR 85 | Temp 97.9°F | Resp 16 | Wt 235.0 lb

## 2024-01-24 DIAGNOSIS — Z113 Encounter for screening for infections with a predominantly sexual mode of transmission: Secondary | ICD-10-CM

## 2024-01-24 DIAGNOSIS — Z23 Encounter for immunization: Secondary | ICD-10-CM | POA: Diagnosis not present

## 2024-01-24 DIAGNOSIS — E785 Hyperlipidemia, unspecified: Secondary | ICD-10-CM

## 2024-01-24 DIAGNOSIS — B20 Human immunodeficiency virus [HIV] disease: Secondary | ICD-10-CM

## 2024-01-24 DIAGNOSIS — Z91199 Patient's noncompliance with other medical treatment and regimen due to unspecified reason: Secondary | ICD-10-CM

## 2024-01-24 MED ORDER — BICTEGRAVIR-EMTRICITAB-TENOFOV 50-200-25 MG PO TABS
1.0000 | ORAL_TABLET | Freq: Every day | ORAL | 11 refills | Status: DC
  Filled 2024-01-24: qty 30, 30d supply, fill #0
  Filled 2024-04-03 – 2024-04-11 (×5): qty 30, 30d supply, fill #1

## 2024-01-24 NOTE — Progress Notes (Signed)
 [facility]  Patient Active Problem List   Diagnosis Date Noted   Irritant contact dermatitis 02/09/2021   Current mild episode of major depressive disorder without prior episode 04/09/2020   Sepsis (HCC) 03/15/2014   SIRS (systemic inflammatory response syndrome) (HCC) 03/15/2014   Pyrexia    HIV (human immunodeficiency virus infection) (HCC)    Abdominal pain, acute    Nausea vomiting and diarrhea 03/14/2014   History of kidney stones 08/12/2011   Abdominal pain, R sided periumbilical 05/25/2011   Human immunodeficiency virus (HIV) disease (HCC) 02/10/2006   Regional enteritis of small bowel (HCC) 02/10/2006   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   MRN: [id number]  Cc: hiv care  HPI:  Eric Chapman. is a 58 y.o. 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 Last seen dr Luiz 03/2020. Previously known frequent missing of medications. He attributed this noncompliance to his severe backpain as he could care less for taking the medications.  He has periods of well controlled hiv interspersed with high viral load count   Denies headache, weight loss, f/c/nightsweat, cough, skin rash, penile discharge, n/v/diarrhea  His pain is well controlled since he has been on testosterone  replacement  Social - working all the time. Still doing own business in sheetrock/paint, and also started working for a [employer] since a week prior to this visit -- [employer]. No smoking/drinking/drugs. Not in a current relationship. 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 He is still looking for a PCP.  He just enrolled in [employer] (consent signed 01/2021). He remains on prezista  and genvoya . Missing doses still. He doesn't really bring up any reason for missing dose except having a routine.  He owns his own house No f c n v diarrhea rash   01/24/23 id clinic f/u See a&P for detail A little run down not sure he caught something or over exert himself at work   05/04/23 id clinic f/u Patient was in [phone] but lost to follow up Research group sent message to him 01/2023 and has been trying to reach him but unsuccessfully He said he hasn't been on any ART  Viral load done by research group 01/2023 was 64  See a&p for detail   01/24/24 id f/u A couple episodes of cellulitis/abscess right leg got treated with antibiotics from [facility] (given cephalexin)  Patient has insurance issue, not taking biktarvy  for 3 months (I discussed with him we have samples -- he is aware but hasn't come to try to get it)  Sexually active -- recent rpr titer baseline 1:2 with pcp; urine gc/chlam negative. He would like rectal/oral swab today   See a&p   Allergies  Allergen Reactions   Oxycodone Hcl Other (See Comments)   Percocet [Oxycodone-Acetaminophen ] Itching and Other (  See Comments)    Extreme agitation      Outpatient Medications Prior to Visit  Medication Sig Dispense Refill   bictegravir-emtricitabine -tenofovir  AF (BIKTARVY ) 50-200-25 MG TABS tablet Take 1 tablet by mouth daily. (Patient not taking: Reported on 01/24/2024) 30 tablet 5   testosterone  cypionate (DEPO-TESTOSTERONE ) 200 MG/ML injection Inject 0.5 mLs (100 mg total) into the muscle every 7 (seven) days. (Patient not taking: Reported on 01/24/2024) 10 mL 0   testosterone  cypionate  (DEPOTESTOSTERONE CYPIONATE) 200 MG/ML injection Inject 0.5 MLs into the muscle once every 7 days (Patient not taking: Reported on 01/24/2024) 10 mL 0   TUBERCULIN SYR 1CC/25GX5/8 25G X 5/8 1 ML MISC  (Patient not taking: Reported on 01/24/2024)     No facility-administered medications prior to visit.     Social History   Socioeconomic History   Marital status: Single    Spouse name: Not on file   Number of children: Not on file   Years of education: Not on file   Highest education level: Not on file  Occupational History   Not on file  Tobacco Use   Smoking status: Never   Smokeless tobacco: Never  Vaping Use   Vaping status: Never Used  Substance and Sexual Activity   Alcohol use: Yes    Alcohol/week: 0.0 standard drinks of alcohol    Comment: occ   Drug use: No   Sexual activity: Yes    Partners: Male    Birth control/protection: Condom    Comment: decined condoms  Other Topics Concern   Not on file  Social History Narrative   Single,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   Social Drivers of Corporate Investment Banker Strain: Not on file  Food Insecurity: Not on file  Transportation Needs: Not on file  Physical Activity: Not on file  Stress: Not on file  Social Connections: Not on file  Intimate Partner Violence: Not on file      Review of Systems    All other ros negative  Objective:    BP (!) 148/94   Pulse 85   Temp 97.9 F (36.6 C) (Temporal)   Resp 16   Wt 235 lb (106.6 kg)   SpO2 97%   BMI 33.72 kg/m  Nursing note and vital signs reviewed.  Physical Exam     General/constitutional: no distress, pleasant HEENT: Normocephalic, PER, Conj Clear, EOMI, Oropharynx clear Neck supple CV: rrr no mrg Lungs: clear to auscultation, normal respiratory effort Abd: Soft, Nontender Ext: no edema Skin: No Rash Neuro: nonfocal MSK: no peripheral joint swelling/tenderness/warmth; back spines nontender       Labs: Lab  Results  Component Value Date   WBC 9.3 07/25/2023   HGB 16.3 07/25/2023   HCT 51.2 (H) 07/25/2023   MCV 87.2 07/25/2023   PLT 329 07/25/2023   Last metabolic panel Lab Results  Component Value Date   GLUCOSE 85 07/25/2023   NA 136 07/25/2023   K 4.3 07/25/2023   CL 100 07/25/2023   CO2 27 07/25/2023   BUN 11 07/25/2023   CREATININE 0.91 07/25/2023   GFRNONAA >60 05/25/2021   GFRAA 113 09/02/2020   CALCIUM 9.0 07/25/2023   PROT 6.9 07/25/2023   ALBUMIN 3.2 (L) 05/25/2021   BILITOT 0.7 07/25/2023   ALKPHOS 32 (L) 05/25/2021   AST 21 07/25/2023   ALT 25 07/25/2023   ANIONGAP 6 05/25/2021    Hiv: 01/2023           64  10/24             399        / 12/03/20        98k 04/09/20        99           /          526 (24%) 06/2019         64k         /          313 (18%) 01/2019         28           /          483 (24%) 03/2018           01/2017         UD  Micro:  Serology: 11/2020 rpr 1:1 11/2020 hep b cAb reactive, sag negative; hep a igm negative; hep c ab negative 11/2020 quant gold negative 11/2020 urine and oropharyngeal gc/chlam negative   03/2020 rpr 1:1  Imaging:  Assessment & Plan:   Problem List Items Addressed This Visit   None Visit Diagnoses       Hyperlipidemia, unspecified hyperlipidemia type    -  Primary     HIV disease (HCC)       Relevant Orders   HIV 1 RNA quant-no reflex-bld   T-helper cells (CD4) count     Screening for STDs (sexually transmitted diseases)       Relevant Orders   Cytology (oral, anal, urethral) ancillary only   Cytology (oral, anal, urethral) ancillary only           #hiv: Noncompliant history Currently since 11/2020, at least, remains on genvoya  and prezista  Need repeat labs today as previously was off therapy when saw me in 11/2020  Patient enrolled in [facility] as of 01/2021 Still missing doses Talked about getting in a routine  12/30/22 started cabenuva end of 2023; he is in the study [id number]   Lab  Results  Component Value Date   HIV1RNAQUANT NOT DETECTED 09/07/2023   Lab Results  Component Value Date   CD4TCELL 26 (L) 05/04/2023   CD4TABS 583 05/04/2023    05/04/23 id clinic assessment Lost to [facility] actg 5359 follow up; I reengaged research group with him today Reviewed genotype 12/2020 and in 2017; no biktegravir resistance or nrti/nnrti/pi resistance. He didn't want to go back to research  07/25/23 id clinic assessment He has new insurance -- working for [employer] for over a year He hasn't taken any medication for over a month ?not covered He wants to talk to our finance team again today    Discussed lenacapvir/cabenuva not yet active study but potential candidate. Also potential candidate for the switch study but he'll need to be on biktarvy  for 6 months with good virologic control   09/07/23 recently restarted biktarvy  06/2023. Missed 2 weeks continuously the last 4 weeks. He doesn't have a schedule and doesn't have his life together for a while Patient lives alone  He has a smart phone and has alarms but can't stick to it    01/24/24 not taking meds for 3 months; insurance and not coming to [facility] Talked with [place] --> patient covered for biktarvy  not via insurance (not allowing specialy pharmacy) but [employer] giving pharmaceutical help through 2025   -discussed u=u -encourage compliance -continue biktarvy  -labs today -f/u 6 weeks    #social Sexually active -- recent rpr titer  1:2 at pcp office and negative urine gc/chlam; will do rectal/oral swab   #hx syphilis #std screening Prior treated Rpr titer 1:1 since 2017 05/04/23 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 He doesn't have a pcp and asked to be referred to one  -has pcp and f/u for that     #hcm: -vaccination Prevnar 2022 Tdap 2014 Meningococcal will reviewed -hepatitis Hep b immune; 12/2022 sAb 366 01/2021 hep c Ab negative -std 11/2020 rpr stable titer 1:1 See above -annual tb screen 11/2020 quant gold negative -Cancer screening Pcp to do age appropriate cancer screening Will need to do anal cancer screening within next few visits      Follow-up: Return in about 6 weeks (around 03/06/2024).      Constance ONEIDA Passer, MD [facility] for Infectious Disease [facility] [phone]  pager   [phone] cell 01/24/2024, 9:03 AM

## 2024-01-24 NOTE — Progress Notes (Signed)
 Specialty Pharmacy Initial Fill Coordination Note  Eric Chapman. is a 58 y.o. male contacted today regarding initial fill of specialty medication(s) Bictegravir-Emtricitab-Tenofov (BIKTARVY )   Patient requested Delivery   Delivery date: 01/25/24   Verified address: [address] [place] [address]   Medication will be filled on 01/24/24.   Patient is aware of 0.00 copayment.

## 2024-01-24 NOTE — Patient Instructions (Signed)
 Rectal/oral swab and blood test today   Please review reprieve hiv nejm --> recommend cholesterol medication for everyone age 58 and above to reduce risk heart attack and stroke   Biktarvy  to be prescribed via [facility] and to be mailed to you... you are being covered via drug company's supplement, not insurance   Follow up in 6 weeks given you are restarting medication

## 2024-01-24 NOTE — Progress Notes (Signed)
 Specialty Pharmacy Initiation Note   Eric Chapman. is a 58 y.o. male who will be followed by the specialty pharmacy service for RxSp HIV    Review of administration, indication, effectiveness, safety, potential side effects, storage/disposable, and missed dose instructions occurred today for patient's specialty medication(s) Bictegravir-Emtricitab-Tenofov (BIKTARVY )     Patient/Caregiver did not have any additional questions or concerns.   Patient's therapy is appropriate to: Continue    Goals Addressed             This Visit's Progress    Achieve Undetectable HIV Viral Load < 20   No change    Patient is initiating therapy. Patient will maintain adherence and work on increased adherence.      Comply with lab assessments   Improving    Patient is initiating therapy. Patient will adhere to provider and/or lab appointments.      Improve or maintain quality of life   Improving    Patient is initiating therapy. Patient will be monitored by provider to determine if a change in treatment plan is warranted.         Alan JINNY Geralds Specialty Pharmacist

## 2024-01-25 LAB — CYTOLOGY, (ORAL, ANAL, URETHRAL) ANCILLARY ONLY
Chlamydia: NEGATIVE
Chlamydia: NEGATIVE
Comment: NEGATIVE
Comment: NEGATIVE
Comment: NORMAL
Comment: NORMAL
Neisseria Gonorrhea: NEGATIVE
Neisseria Gonorrhea: NEGATIVE

## 2024-01-25 LAB — T-HELPER CELLS (CD4) COUNT (NOT AT ARMC)
CD4 % Helper T Cell: 25 % — ABNORMAL LOW (ref 33–65)
CD4 T Cell Abs: 359 /uL — ABNORMAL LOW (ref 400–1790)

## 2024-01-27 LAB — HIV-1 RNA QUANT-NO REFLEX-BLD
HIV 1 RNA Quant: 210 {copies}/mL — ABNORMAL HIGH
HIV-1 RNA Quant, Log: 2.32 {Log_copies}/mL — ABNORMAL HIGH

## 2024-01-30 ENCOUNTER — Other Ambulatory Visit: Payer: Self-pay

## 2024-03-08 ENCOUNTER — Ambulatory Visit: Admitting: Internal Medicine

## 2024-03-13 ENCOUNTER — Other Ambulatory Visit (HOSPITAL_COMMUNITY): Payer: Self-pay

## 2024-03-13 ENCOUNTER — Telehealth: Payer: Self-pay

## 2024-03-13 NOTE — Telephone Encounter (Signed)
 Detectable Viral Load Intervention (DVL)  Most recent VL:  HIV 1 RNA Quant  Date Value Ref Range Status  01/24/2024 210 (H) NOT DETECTED copies/mL Final  09/07/2023 NOT DETECTED NOT DETECTED copies/mL Final  07/25/2023 3,190 (H) NOT DETECTED copies/mL Final    Last Clinic Visit: 10/29  Current ART regimen: Biktarvy   Appointment status: patient has future appointment scheduled  Medication last dispensed (per chart review):   Medication Adherence   What pharmacy do you use for your ART? [facility]  Do you pick up your medication at the pharmacy or is it mailed to you? Mailed  How often do you miss a dose your ART? a few times a month  Are you experiencing any side effects with your ART? no  Are you having any trouble remembering what medication(s) you are supposed to take or how you are supposed to take them? no  What helps you remember to take your medication(s)? NA   Barriers to Care   No barriers to care    Interventions   Called patient to discuss medication adherence and possible barriers to care. No issues noted during call. Confirmed upcoming appt. Lorenda CHRISTELLA Code, RMA

## 2024-04-03 ENCOUNTER — Other Ambulatory Visit (HOSPITAL_COMMUNITY): Payer: Self-pay

## 2024-04-03 ENCOUNTER — Other Ambulatory Visit: Payer: Self-pay

## 2024-04-03 NOTE — Progress Notes (Signed)
 Specialty Pharmacy Refill Coordination Note  Yasseen Salls. is a 59 y.o. male contacted today regarding refills of specialty medication(s) Bictegravir-Emtricitab-Tenofov (BIKTARVY )   Patient requested [facility] at [facility] [place] date: 04/04/24   Medication will be filled on: 04/04/24

## 2024-04-04 ENCOUNTER — Other Ambulatory Visit (HOSPITAL_COMMUNITY): Payer: Self-pay

## 2024-04-11 ENCOUNTER — Other Ambulatory Visit: Payer: Self-pay

## 2024-04-15 ENCOUNTER — Other Ambulatory Visit: Payer: Self-pay

## 2024-04-15 ENCOUNTER — Other Ambulatory Visit (HOSPITAL_COMMUNITY): Payer: Self-pay

## 2024-04-15 ENCOUNTER — Other Ambulatory Visit (HOSPITAL_COMMUNITY)
Admission: RE | Admit: 2024-04-15 | Discharge: 2024-04-15 | Disposition: A | Discharge status: Home / Self Care | Source: Ambulatory Visit | Attending: Internal Medicine | Admitting: Internal Medicine

## 2024-04-15 ENCOUNTER — Ambulatory Visit: Admitting: Internal Medicine

## 2024-04-15 VITALS — BP 138/91 | HR 87 | Temp 98.6°F | Ht 70.0 in | Wt 237.4 lb

## 2024-04-15 DIAGNOSIS — Z23 Encounter for immunization: Secondary | ICD-10-CM

## 2024-04-15 DIAGNOSIS — Z113 Encounter for screening for infections with a predominantly sexual mode of transmission: Secondary | ICD-10-CM | POA: Insufficient documentation

## 2024-04-15 DIAGNOSIS — Z79899 Other long term (current) drug therapy: Secondary | ICD-10-CM

## 2024-04-15 DIAGNOSIS — Z91148 Patient's other noncompliance with medication regimen for other reason: Secondary | ICD-10-CM | POA: Diagnosis not present

## 2024-04-15 DIAGNOSIS — B20 Human immunodeficiency virus [HIV] disease: Secondary | ICD-10-CM | POA: Diagnosis not present

## 2024-04-15 DIAGNOSIS — Z8619 Personal history of other infectious and parasitic diseases: Secondary | ICD-10-CM

## 2024-04-15 NOTE — Progress Notes (Signed)
 "       [facility]  Patient Active Problem List   Diagnosis Date Noted   Irritant contact dermatitis 02/09/2021   Current mild episode of major depressive disorder without prior episode 04/09/2020   Sepsis (HCC) 03/15/2014   SIRS (systemic inflammatory response syndrome) (HCC) 03/15/2014   Pyrexia    HIV (human immunodeficiency virus infection) (HCC)    Abdominal pain, acute    Nausea vomiting and diarrhea 03/14/2014   History of kidney stones 08/12/2011   Abdominal pain, R sided periumbilical 05/25/2011   Human immunodeficiency virus (HIV) disease (HCC) 02/10/2006   Regional enteritis of small bowel (HCC) 02/10/2006   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   MRN: [id number]  Cc: hiv care  HPI:  Eric Chapman. is a 59 y.o. 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 Last seen dr Luiz 03/2020. Previously known frequent missing of medications. He attributed this noncompliance to his severe backpain as he could care less for taking the medications.  He has periods of well controlled hiv interspersed with high viral load count   Denies headache, weight loss, f/c/nightsweat, cough, skin rash, penile discharge, n/v/diarrhea  His pain is well controlled since he has been on testosterone  replacement  Social - working all the time. Still doing own business in sheetrock/paint, and also started working for a [employer] since a week prior to this visit -- [employer]. No smoking/drinking/drugs. Not in a current relationship. 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 He is still looking for a PCP.  He just enrolled in [facility] (consent signed 01/2021). He remains on prezista  and genvoya . Missing doses still. He doesn't really bring up any reason for missing dose except having a routine.  He owns his own house No f c n v diarrhea rash   01/24/23 id clinic f/u See a&P for detail A little run down not sure he caught something or over exert himself at work   05/04/23 id clinic f/u Patient was in [phone] but lost to follow up Research group sent message to him 01/2023 and has been trying to reach him but unsuccessfully He said he hasn't been on any ART  Viral load done by research group 01/2023 was 64  See a&p for detail   01/24/24 id f/u A couple episodes of cellulitis/abscess right leg got treated with antibiotics from [facility] (given cephalexin)  Patient has insurance issue, not taking biktarvy  for 3 months (I discussed with him we have samples -- he is aware but hasn't come to try to get it)  Sexually active -- recent rpr titer baseline 1:2 with pcp; urine gc/chlam negative. He would like rectal/oral swab today    04/15/24 id clinic visit History of intermittent noncompliance.  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 Sexually active   No complaints/symptoms today      Allergies  Allergen Reactions   Oxycodone Hcl Other (See Comments)   Percocet [Oxycodone-Acetaminophen ] Itching and Other (See Comments)    Extreme agitation      Outpatient Medications Prior to Visit  Medication Sig Dispense Refill   bictegravir-emtricitabine -tenofovir  AF (BIKTARVY ) 50-200-25 MG TABS tablet Take 1 tablet by mouth daily. 30 tablet 11   testosterone  cypionate  (DEPO-TESTOSTERONE ) 200 MG/ML injection Inject 0.5 mLs (100 mg total) into the muscle every 7 (seven) days. (Patient not taking: Reported on 01/24/2024) 10 mL 0   testosterone  cypionate (DEPOTESTOSTERONE CYPIONATE) 200 MG/ML injection Inject 0.5 MLs into the muscle once every 7 days (Patient not taking: Reported on 01/24/2024) 10 mL 0   TUBERCULIN SYR 1CC/25GX5/8 25G X 5/8 1 ML MISC  (Patient not taking: Reported on 01/24/2024)     No facility-administered medications prior to visit.     Social History   Socioeconomic History   Marital status: Single    Spouse name: Not on file   Number of children: Not on file   Years of education: Not on file   Highest education level: Not on file  Occupational History   Not on file  Tobacco Use   Smoking status: Never   Smokeless tobacco: Never  Vaping Use   Vaping status: Never Used  Substance and Sexual Activity   Alcohol use: Yes    Alcohol/week: 0.0 standard drinks of alcohol    Comment: occ   Drug use: No   Sexual activity: Yes    Partners: Male    Birth control/protection: Condom    Comment: decined condoms  Other Topics Concern   Not on file  Social History Narrative   Single,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   Social Drivers of Health   Tobacco Use: Low Risk (09/07/2023)   Patient History    Smoking Tobacco Use: Never    Smokeless Tobacco Use: Never    Passive Exposure: Not on file  Financial Resource Strain: Not on file  Food Insecurity: Not on file  Transportation Needs: Not on file  Physical Activity: Not on file  Stress: Not on file  Social Connections: Not on file  Intimate Partner Violence: Not on file  Depression (PHQ2-9): High Risk (07/25/2023)   Depression (PHQ2-9)    PHQ-2 Score: 11  Alcohol Screen: Not on file  Housing: Not on file  Utilities: Not on file  Health Literacy: Not on file      Review of Systems    All other ros negative  Objective:    There were no vitals  taken for this visit. Nursing note and vital signs reviewed.  Physical Exam     General/constitutional: no distress, pleasant HEENT: Normocephalic, PER, Conj Clear, EOMI, Oropharynx clear Neck supple CV: rrr no mrg Lungs: clear to auscultation, normal respiratory effort Abd: Soft, Nontender Ext: no edema Skin: No Rash Neuro: nonfocal MSK: no peripheral joint swelling/tenderness/warmth; back spines nontender       Labs: Lab Results  Component Value Date   WBC 9.3 07/25/2023   HGB 16.3 07/25/2023   HCT 51.2 (H) 07/25/2023   MCV 87.2 07/25/2023   PLT 329 07/25/2023   Last metabolic panel Lab Results  Component Value Date   GLUCOSE 85 07/25/2023   NA 136 07/25/2023   K 4.3 07/25/2023   CL 100 07/25/2023   CO2 27 07/25/2023   BUN  11 07/25/2023   CREATININE 0.91 07/25/2023   GFRNONAA >60 05/25/2021   GFRAA 113 09/02/2020   CALCIUM 9.0 07/25/2023   PROT 6.9 07/25/2023   ALBUMIN 3.2 (L) 05/25/2021   BILITOT 0.7 07/25/2023   ALKPHOS 32 (L) 05/25/2021   AST 21 07/25/2023   ALT 25 07/25/2023   ANIONGAP 6 05/25/2021    Hiv: 01/2023           64 10/24             399        / 12/03/20        98k 04/09/20        99           /          526 (24%) 06/2019         64k         /          313 (18%) 01/2019         28           /          483 (24%) 03/2018           01/2017         UD  Micro:  Serology: 11/2020 rpr 1:1 11/2020 hep b cAb reactive, sag negative; hep a igm negative; hep c ab negative 11/2020 quant gold negative 11/2020 urine and oropharyngeal gc/chlam negative   03/2020 rpr 1:1  Imaging:  Assessment & Plan:   Problem List Items Addressed This Visit   None Visit Diagnoses       HIV disease (HCC)    -  Primary   Relevant Orders   HIV 1 RNA quant-no reflex-bld     Screening for STDs (sexually transmitted diseases)       Relevant Orders   RPR   Urine cytology ancillary only   Cytology (oral, anal, urethral) ancillary only   Cytology  (oral, anal, urethral) ancillary only     History of syphilis                #hiv: Noncompliant history Currently since 11/2020, at least, remains on genvoya  and prezista . Reviewed genotypes and transitioned to biktarvy  11/2020 Patient enrolled in [facility] as of 01/2021 Still missing doses Talked about getting in a routine   12/30/22 enrolled in study [id number] 05/04/23 Lost to [facility] actg [id number] follow up; I reengaged research group with him today. Reviewed genotype 12/2020 and in 2017; no biktegravir resistance or nrti/nnrti/pi resistance. He didn't want to go back to research 07/25/23 He has new insurance -- working for [employer] for over a year He hasn't taken any medication for over a month ?not covered He wants to talk to our finance team again today    Discussed lenacapvir/cabenuva not yet active study but potential candidate. Also potential candidate for the switch study but he'll need to be on biktarvy  for 6 months with good virologic control   09/07/23 recently restarted biktarvy  06/2023. Missed 2 weeks continuously the last 4 weeks. He doesn't have a schedule and doesn't have his life together for a while Patient lives alone  He has a smart phone and has alarms but can't stick to it    01/24/24 not taking meds for 3 months; insurance and not coming to [facility] Talked with Ar[facility] --> patient covered for biktarvy  not via insurance (not allowing [employer]ahoo) but [employer] giving pharmaceutical help through 2025  04/15/24 missing biktarvy  again. Question with insurance coverage? 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 He doesn't have a pcp and asked to be referred to one  -has pcp and f/u for that     #hcm: -vaccination Prevnar 2022 Tdap 03/2024 Meningococcal 03/2024 -hepatitis Hep b immune; 12/2022 sAb 366 01/2021 hep c Ab negative -std See syphilis section above -annual tb screen 11/2020 quant gold negative -Cancer screening Pcp to do age appropriate cancer screening Will need to do anal cancer screening within next few visits      Follow-up: No follow-ups on file.      Constance ONEIDA Passer, MD [facility] [phone]  pager   [phone] cell 04/15/2024, 8:54 AM  "

## 2024-04-15 NOTE — Patient Instructions (Addendum)
 Vaccine: Tetanus/pertussis booster  Meningitis infection booster    Labs today   Continue biktarvy    If you have issue with prescription please send us  a mychart message right away

## 2024-04-15 NOTE — Addendum Note (Signed)
 Addended by: CELESTIA LELA HERO on: 04/15/2024 10:20 AM   Modules accepted: Orders

## 2024-04-16 LAB — CYTOLOGY, (ORAL, ANAL, URETHRAL) ANCILLARY ONLY
Chlamydia: NEGATIVE
Chlamydia: NEGATIVE
Comment: NEGATIVE
Comment: NEGATIVE
Comment: NORMAL
Comment: NORMAL
Neisseria Gonorrhea: NEGATIVE
Neisseria Gonorrhea: NEGATIVE

## 2024-04-16 LAB — URINE CYTOLOGY ANCILLARY ONLY
Chlamydia: NEGATIVE
Comment: NEGATIVE
Comment: NEGATIVE
Comment: NORMAL
Neisseria Gonorrhea: NEGATIVE
Trichomonas: NEGATIVE

## 2024-04-17 ENCOUNTER — Other Ambulatory Visit (HOSPITAL_COMMUNITY): Payer: Self-pay

## 2024-04-17 ENCOUNTER — Other Ambulatory Visit: Payer: Self-pay

## 2024-04-17 ENCOUNTER — Other Ambulatory Visit: Payer: Self-pay | Admitting: Pharmacist

## 2024-04-17 LAB — HIV-1 RNA QUANT-NO REFLEX-BLD
HIV 1 RNA Quant: 84 {copies}/mL — ABNORMAL HIGH
HIV-1 RNA Quant, Log: 1.92 {Log_copies}/mL — ABNORMAL HIGH

## 2024-04-17 LAB — T PALLIDUM AB: T Pallidum Abs: POSITIVE — AB

## 2024-04-17 LAB — SYPHILIS: RPR W/REFLEX TO RPR TITER AND TREPONEMAL ANTIBODIES, TRADITIONAL SCREENING AND DIAGNOSIS ALGORITHM: RPR Ser Ql: REACTIVE — AB

## 2024-04-17 LAB — RPR TITER: RPR Titer: 1:4 {titer} — ABNORMAL HIGH

## 2024-04-17 MED ORDER — BICTEGRAVIR-EMTRICITAB-TENOFOV 50-200-25 MG PO TABS: 1.0000 | ORAL_TABLET | Freq: Every day | ORAL | 11 refills | Status: AC

## 2024-04-17 NOTE — Progress Notes (Signed)
 Per Arland, print rx to be faxed to [facility] pharmacy.  Alan Geralds, PharmD, CPP, BCIDP, AAHIVP Clinical Pharmacist Practitioner Infectious Diseases Clinical Pharmacist [facility]

## 2024-04-18 ENCOUNTER — Telehealth: Payer: Self-pay

## 2024-04-18 ENCOUNTER — Other Ambulatory Visit (HOSPITAL_COMMUNITY): Payer: Self-pay

## 2024-04-18 NOTE — Telephone Encounter (Signed)
 Patient was approved through [employer] Advancing Access 05/31/23-05/31/24.  I faxed his hardcopy script to [facility]

## 2024-04-22 ENCOUNTER — Other Ambulatory Visit: Payer: Self-pay

## 2024-04-22 NOTE — Progress Notes (Signed)
 Script have to be filled with [facility] Advancing Access)

## 2024-06-06 ENCOUNTER — Other Ambulatory Visit: Payer: Self-pay

## 2024-06-20 ENCOUNTER — Ambulatory Visit: Payer: Self-pay | Admitting: Internal Medicine
